# Patient Record
Sex: Female | Born: 1945 | Race: White | Hispanic: No | Smoking: Never smoker
Health system: Southern US, Community
[De-identification: ages and names within clinical notes are randomized; demographics above are authoritative.]

## PROBLEM LIST (undated history)

## (undated) DIAGNOSIS — I10 Essential (primary) hypertension: Secondary | ICD-10-CM

## (undated) DIAGNOSIS — G40909 Epilepsy, unspecified, not intractable, without status epilepticus: Secondary | ICD-10-CM

## (undated) DIAGNOSIS — E1340 Other specified diabetes mellitus with diabetic neuropathy, unspecified: Secondary | ICD-10-CM

## (undated) DIAGNOSIS — F330 Major depressive disorder, recurrent, mild: Secondary | ICD-10-CM

## (undated) DIAGNOSIS — E118 Type 2 diabetes mellitus with unspecified complications: Secondary | ICD-10-CM

## (undated) DIAGNOSIS — M25512 Pain in left shoulder: Secondary | ICD-10-CM

## (undated) DIAGNOSIS — E669 Obesity, unspecified: Secondary | ICD-10-CM

## (undated) DIAGNOSIS — E559 Vitamin D deficiency, unspecified: Secondary | ICD-10-CM

## (undated) DIAGNOSIS — E785 Hyperlipidemia, unspecified: Secondary | ICD-10-CM

## (undated) DIAGNOSIS — K5901 Slow transit constipation: Secondary | ICD-10-CM

## (undated) DIAGNOSIS — M25511 Pain in right shoulder: Secondary | ICD-10-CM

## (undated) DIAGNOSIS — F0390 Unspecified dementia without behavioral disturbance: Secondary | ICD-10-CM

## (undated) DIAGNOSIS — I639 Cerebral infarction, unspecified: Secondary | ICD-10-CM

## (undated) DIAGNOSIS — S7292XA Unspecified fracture of left femur, initial encounter for closed fracture: Secondary | ICD-10-CM

## (undated) HISTORY — DX: Type 2 diabetes mellitus with unspecified complications: E11.8

## (undated) HISTORY — DX: Unspecified dementia, unspecified severity, without behavioral disturbance, psychotic disturbance, mood disturbance, and anxiety: F03.90

## (undated) HISTORY — DX: Major depressive disorder, recurrent, mild: F33.0

## (undated) HISTORY — DX: Pain in right shoulder: M25.511

## (undated) HISTORY — DX: Pain in right shoulder: M25.512

## (undated) HISTORY — DX: Hyperlipidemia, unspecified: E78.5

## (undated) HISTORY — DX: Cerebral infarction, unspecified: I63.9

## (undated) HISTORY — DX: Epilepsy, unspecified, not intractable, without status epilepticus: G40.909

## (undated) HISTORY — DX: Essential (primary) hypertension: I10

## (undated) HISTORY — PX: OTHER SURGICAL HISTORY: SHX169

## (undated) HISTORY — DX: Slow transit constipation: K59.01

## (undated) HISTORY — DX: Obesity, unspecified: E66.9

## (undated) HISTORY — DX: Unspecified fracture of left femur, initial encounter for closed fracture: S72.92XA

## (undated) HISTORY — DX: Other specified diabetes mellitus with diabetic neuropathy, unspecified: E13.40

## (undated) HISTORY — DX: Vitamin D deficiency, unspecified: E55.9

---

## 1997-10-13 HISTORY — PX: OTHER SURGICAL HISTORY: SHX169

## 2000-10-13 DIAGNOSIS — I639 Cerebral infarction, unspecified: Secondary | ICD-10-CM

## 2000-10-13 HISTORY — DX: Cerebral infarction, unspecified: I63.9

## 2001-11-13 ENCOUNTER — Encounter: Payer: Self-pay | Admitting: Family Medicine

## 2002-10-13 ENCOUNTER — Encounter: Payer: Self-pay | Admitting: Family Medicine

## 2002-10-13 LAB — CONVERTED CEMR LAB: Hgb A1c MFr Bld: 7.5 %

## 2003-12-12 ENCOUNTER — Encounter: Payer: Self-pay | Admitting: Family Medicine

## 2003-12-12 LAB — CONVERTED CEMR LAB: Hgb A1c MFr Bld: 7.4 %

## 2004-04-12 ENCOUNTER — Encounter: Payer: Self-pay | Admitting: Family Medicine

## 2004-04-12 LAB — CONVERTED CEMR LAB: Hgb A1c MFr Bld: 7.9 %

## 2004-08-13 ENCOUNTER — Encounter: Payer: Self-pay | Admitting: Family Medicine

## 2004-08-13 LAB — CONVERTED CEMR LAB: Hgb A1c MFr Bld: 7.2 %

## 2004-11-13 ENCOUNTER — Encounter: Payer: Self-pay | Admitting: Family Medicine

## 2004-11-13 LAB — CONVERTED CEMR LAB: Hgb A1c MFr Bld: 7.6 %

## 2005-09-12 ENCOUNTER — Encounter: Payer: Self-pay | Admitting: Family Medicine

## 2006-03-16 ENCOUNTER — Ambulatory Visit: Payer: Self-pay | Admitting: Family Medicine

## 2006-04-12 ENCOUNTER — Encounter: Payer: Self-pay | Admitting: Family Medicine

## 2006-04-13 ENCOUNTER — Ambulatory Visit: Payer: Self-pay | Admitting: Family Medicine

## 2006-04-16 ENCOUNTER — Ambulatory Visit: Payer: Self-pay | Admitting: Family Medicine

## 2006-04-29 ENCOUNTER — Ambulatory Visit: Payer: Self-pay | Admitting: Family Medicine

## 2006-06-03 ENCOUNTER — Ambulatory Visit: Payer: Self-pay | Admitting: Family Medicine

## 2006-06-13 ENCOUNTER — Encounter: Payer: Self-pay | Admitting: Family Medicine

## 2006-06-24 ENCOUNTER — Ambulatory Visit: Payer: Self-pay | Admitting: Family Medicine

## 2006-06-26 ENCOUNTER — Encounter: Payer: Self-pay | Admitting: Family Medicine

## 2006-06-26 ENCOUNTER — Other Ambulatory Visit: Admission: RE | Admit: 2006-06-26 | Discharge: 2006-06-26 | Payer: Self-pay | Admitting: Family Medicine

## 2006-06-26 ENCOUNTER — Ambulatory Visit: Payer: Self-pay | Admitting: Family Medicine

## 2006-06-26 LAB — CONVERTED CEMR LAB: Pap Smear: NORMAL

## 2006-07-01 ENCOUNTER — Encounter: Admission: RE | Admit: 2006-07-01 | Discharge: 2006-07-01 | Payer: Self-pay | Admitting: Family Medicine

## 2006-07-13 ENCOUNTER — Encounter: Admission: RE | Admit: 2006-07-13 | Discharge: 2006-07-13 | Payer: Self-pay | Admitting: Family Medicine

## 2006-07-13 LAB — FECAL OCCULT BLOOD, GUAIAC: Fecal Occult Blood: NEGATIVE

## 2006-07-15 ENCOUNTER — Ambulatory Visit: Payer: Self-pay | Admitting: Family Medicine

## 2006-08-17 ENCOUNTER — Ambulatory Visit: Payer: Self-pay | Admitting: General Surgery

## 2006-10-02 ENCOUNTER — Ambulatory Visit: Payer: Self-pay | Admitting: Family Medicine

## 2006-10-14 ENCOUNTER — Ambulatory Visit: Payer: Self-pay | Admitting: Family Medicine

## 2006-10-14 LAB — CONVERTED CEMR LAB: Hgb A1c MFr Bld: 6.9 %

## 2006-10-16 ENCOUNTER — Ambulatory Visit: Payer: Self-pay | Admitting: Family Medicine

## 2006-11-20 ENCOUNTER — Ambulatory Visit: Payer: Self-pay | Admitting: Family Medicine

## 2007-01-29 ENCOUNTER — Ambulatory Visit: Payer: Self-pay | Admitting: Family Medicine

## 2007-03-31 ENCOUNTER — Encounter: Payer: Self-pay | Admitting: Family Medicine

## 2007-03-31 DIAGNOSIS — E119 Type 2 diabetes mellitus without complications: Secondary | ICD-10-CM

## 2007-03-31 DIAGNOSIS — F329 Major depressive disorder, single episode, unspecified: Secondary | ICD-10-CM

## 2007-03-31 DIAGNOSIS — I10 Essential (primary) hypertension: Secondary | ICD-10-CM | POA: Insufficient documentation

## 2007-04-02 ENCOUNTER — Ambulatory Visit: Payer: Self-pay | Admitting: Family Medicine

## 2007-04-02 DIAGNOSIS — B351 Tinea unguium: Secondary | ICD-10-CM

## 2007-04-30 ENCOUNTER — Ambulatory Visit: Payer: Self-pay | Admitting: Family Medicine

## 2007-05-06 ENCOUNTER — Encounter (INDEPENDENT_AMBULATORY_CARE_PROVIDER_SITE_OTHER): Payer: Self-pay | Admitting: *Deleted

## 2007-05-14 ENCOUNTER — Ambulatory Visit: Payer: Self-pay | Admitting: Family Medicine

## 2007-07-23 ENCOUNTER — Ambulatory Visit: Payer: Self-pay | Admitting: Family Medicine

## 2007-08-20 ENCOUNTER — Encounter: Admission: RE | Admit: 2007-08-20 | Discharge: 2007-08-20 | Payer: Self-pay | Admitting: Family Medicine

## 2007-08-23 ENCOUNTER — Encounter (INDEPENDENT_AMBULATORY_CARE_PROVIDER_SITE_OTHER): Payer: Self-pay | Admitting: *Deleted

## 2007-08-27 ENCOUNTER — Encounter: Payer: Self-pay | Admitting: Family Medicine

## 2007-09-24 ENCOUNTER — Ambulatory Visit: Payer: Self-pay | Admitting: Family Medicine

## 2007-09-25 LAB — CONVERTED CEMR LAB
ALT: 33 units/L (ref 0–35)
BUN: 14 mg/dL (ref 6–23)
Calcium: 9.7 mg/dL (ref 8.4–10.5)
Chloride: 101 meq/L (ref 96–112)
Creatinine, Ser: 0.9 mg/dL (ref 0.4–1.2)
Creatinine,U: 156.7 mg/dL
Hgb A1c MFr Bld: 7.3 % — ABNORMAL HIGH (ref 4.6–6.0)
Microalb, Ur: 3.1 mg/dL — ABNORMAL HIGH (ref 0.0–1.9)
VLDL: 45 mg/dL — ABNORMAL HIGH (ref 0–40)

## 2007-10-05 ENCOUNTER — Encounter: Payer: Self-pay | Admitting: Family Medicine

## 2007-10-05 ENCOUNTER — Ambulatory Visit: Payer: Self-pay | Admitting: Family Medicine

## 2007-10-05 ENCOUNTER — Other Ambulatory Visit: Admission: RE | Admit: 2007-10-05 | Discharge: 2007-10-05 | Payer: Self-pay | Admitting: Family Medicine

## 2007-10-12 ENCOUNTER — Encounter (INDEPENDENT_AMBULATORY_CARE_PROVIDER_SITE_OTHER): Payer: Self-pay | Admitting: *Deleted

## 2007-11-09 ENCOUNTER — Ambulatory Visit: Payer: Self-pay | Admitting: Family Medicine

## 2007-11-16 ENCOUNTER — Ambulatory Visit: Payer: Self-pay | Admitting: Family Medicine

## 2007-11-18 ENCOUNTER — Encounter (INDEPENDENT_AMBULATORY_CARE_PROVIDER_SITE_OTHER): Payer: Self-pay | Admitting: *Deleted

## 2008-07-18 ENCOUNTER — Ambulatory Visit: Payer: Self-pay | Admitting: Family Medicine

## 2008-09-29 ENCOUNTER — Encounter: Admission: RE | Admit: 2008-09-29 | Discharge: 2008-09-29 | Payer: Self-pay | Admitting: Family Medicine

## 2008-09-29 LAB — HM MAMMOGRAPHY: HM Mammogram: NORMAL

## 2008-10-03 ENCOUNTER — Encounter (INDEPENDENT_AMBULATORY_CARE_PROVIDER_SITE_OTHER): Payer: Self-pay | Admitting: *Deleted

## 2008-10-09 ENCOUNTER — Encounter: Payer: Self-pay | Admitting: Family Medicine

## 2008-12-07 ENCOUNTER — Ambulatory Visit: Payer: Self-pay | Admitting: Family Medicine

## 2009-04-06 ENCOUNTER — Ambulatory Visit: Payer: Self-pay | Admitting: Family Medicine

## 2009-04-09 LAB — CONVERTED CEMR LAB
ALT: 16 units/L (ref 0–35)
AST: 20 units/L (ref 0–37)
Alkaline Phosphatase: 82 units/L (ref 39–117)
BUN: 15 mg/dL (ref 6–23)
Basophils Absolute: 0.1 10*3/uL (ref 0.0–0.1)
Bilirubin, Direct: 0.1 mg/dL (ref 0.0–0.3)
Calcium: 9.3 mg/dL (ref 8.4–10.5)
Creatinine,U: 148.3 mg/dL
Direct LDL: 62.3 mg/dL
Eosinophils Relative: 4.5 % (ref 0.0–5.0)
GFR calc non Af Amer: 76.81 mL/min (ref >60)
HCT: 35.4 % — ABNORMAL LOW (ref 36.0–46.0)
HDL: 33.6 mg/dL — ABNORMAL LOW (ref >39.00)
Hemoglobin: 11.8 g/dL — ABNORMAL LOW (ref 12.0–15.0)
Lymphocytes Relative: 32.3 % (ref 12.0–46.0)
Lymphs Abs: 2.5 10*3/uL (ref 0.7–4.0)
Microalb Creat Ratio: 10.1 mg/g (ref 0.0–30.0)
Monocytes Relative: 7.7 % (ref 3.0–12.0)
Platelets: 201 10*3/uL (ref 150.0–400.0)
Potassium: 3.6 meq/L (ref 3.5–5.1)
Sodium: 144 meq/L (ref 135–145)
TSH: 4.55 microintl units/mL (ref 0.35–5.50)
Total Bilirubin: 0.6 mg/dL (ref 0.3–1.2)
Triglycerides: 249 mg/dL — ABNORMAL HIGH (ref 0.0–149.0)
VLDL: 49.8 mg/dL — ABNORMAL HIGH (ref 0.0–40.0)
WBC: 7.7 10*3/uL (ref 4.5–10.5)

## 2009-04-11 ENCOUNTER — Ambulatory Visit: Payer: Self-pay | Admitting: Family Medicine

## 2009-05-29 ENCOUNTER — Telehealth: Payer: Self-pay | Admitting: Family Medicine

## 2009-07-26 ENCOUNTER — Ambulatory Visit: Payer: Self-pay | Admitting: Family Medicine

## 2009-10-18 ENCOUNTER — Ambulatory Visit: Payer: Self-pay | Admitting: Family Medicine

## 2009-10-19 LAB — CONVERTED CEMR LAB: Vit D, 25-Hydroxy: 17 ng/mL — ABNORMAL LOW (ref 30–89)

## 2009-10-20 LAB — CONVERTED CEMR LAB
AST: 17 units/L (ref 0–37)
Albumin: 3.7 g/dL (ref 3.5–5.2)
Alkaline Phosphatase: 77 units/L (ref 39–117)
Basophils Absolute: 0.1 10*3/uL (ref 0.0–0.1)
Basophils Relative: 1.7 % (ref 0.0–3.0)
Bilirubin, Direct: 0 mg/dL (ref 0.0–0.3)
Calcium: 9.5 mg/dL (ref 8.4–10.5)
Direct LDL: 57.1 mg/dL
Eosinophils Absolute: 0.4 10*3/uL (ref 0.0–0.7)
GFR calc non Af Amer: 59.27 mL/min (ref >60)
HDL: 35.1 mg/dL — ABNORMAL LOW (ref >39.00)
Hemoglobin: 11.1 g/dL — ABNORMAL LOW (ref 12.0–15.0)
Hgb A1c MFr Bld: 7.1 % — ABNORMAL HIGH (ref 4.6–6.5)
Lymphocytes Relative: 32.7 % (ref 12.0–46.0)
MCHC: 31.8 g/dL (ref 30.0–36.0)
Monocytes Relative: 7.3 % (ref 3.0–12.0)
Neutro Abs: 4 10*3/uL (ref 1.4–7.7)
Neutrophils Relative %: 52.6 % (ref 43.0–77.0)
Potassium: 4.1 meq/L (ref 3.5–5.1)
RBC: 4 M/uL (ref 3.87–5.11)
Sodium: 144 meq/L (ref 135–145)
Total Bilirubin: 0.5 mg/dL (ref 0.3–1.2)
Total CHOL/HDL Ratio: 4
VLDL: 53.2 mg/dL — ABNORMAL HIGH (ref 0.0–40.0)

## 2009-10-31 ENCOUNTER — Ambulatory Visit: Payer: Self-pay | Admitting: Family Medicine

## 2009-10-31 DIAGNOSIS — R609 Edema, unspecified: Secondary | ICD-10-CM | POA: Insufficient documentation

## 2009-10-31 DIAGNOSIS — E559 Vitamin D deficiency, unspecified: Secondary | ICD-10-CM | POA: Insufficient documentation

## 2009-11-01 ENCOUNTER — Telehealth: Payer: Self-pay | Admitting: Family Medicine

## 2009-12-03 ENCOUNTER — Ambulatory Visit: Payer: Self-pay | Admitting: Family Medicine

## 2009-12-03 LAB — CONVERTED CEMR LAB: ALT: 12 units/L (ref 0–35)

## 2009-12-13 ENCOUNTER — Telehealth: Payer: Self-pay | Admitting: Family Medicine

## 2010-01-04 ENCOUNTER — Ambulatory Visit: Payer: Self-pay | Admitting: Family Medicine

## 2010-01-04 LAB — CONVERTED CEMR LAB
AST: 21 units/L (ref 0–37)
CO2: 31 meq/L (ref 19–32)
Chloride: 107 meq/L (ref 96–112)
Cholesterol: 121 mg/dL (ref 0–200)
Glucose, Bld: 136 mg/dL — ABNORMAL HIGH (ref 70–99)
Hgb A1c MFr Bld: 7 % — ABNORMAL HIGH (ref 4.6–6.5)
Sodium: 145 meq/L (ref 135–145)
Total CHOL/HDL Ratio: 3

## 2010-01-06 LAB — CONVERTED CEMR LAB: Vit D, 25-Hydroxy: 35 ng/mL (ref 30–89)

## 2010-01-09 ENCOUNTER — Ambulatory Visit: Payer: Self-pay | Admitting: Family Medicine

## 2010-04-03 ENCOUNTER — Ambulatory Visit: Payer: Self-pay | Admitting: Family Medicine

## 2010-04-04 LAB — CONVERTED CEMR LAB
Basophils Absolute: 0 10*3/uL (ref 0.0–0.1)
Bilirubin, Direct: 0.2 mg/dL (ref 0.0–0.3)
Calcium: 9.1 mg/dL (ref 8.4–10.5)
Creatinine, Ser: 0.8 mg/dL (ref 0.4–1.2)
GFR calc non Af Amer: 82.49 mL/min (ref >60)
HCT: 36.3 % (ref 36.0–46.0)
HDL: 33.9 mg/dL — ABNORMAL LOW (ref >39.00)
Hgb A1c MFr Bld: 6.6 % — ABNORMAL HIGH (ref 4.6–6.5)
LDL Cholesterol: 49 mg/dL (ref 0–99)
Lymphs Abs: 2.3 10*3/uL (ref 0.7–4.0)
Microalb Creat Ratio: 1.4 mg/g (ref 0.0–30.0)
Microalb, Ur: 1.4 mg/dL (ref 0.0–1.9)
Monocytes Relative: 6.8 % (ref 3.0–12.0)
Neutrophils Relative %: 61 % (ref 43.0–77.0)
Platelets: 219 10*3/uL (ref 150.0–400.0)
RDW: 17.4 % — ABNORMAL HIGH (ref 11.5–14.6)
Sodium: 147 meq/L — ABNORMAL HIGH (ref 135–145)
Total Bilirubin: 0.6 mg/dL (ref 0.3–1.2)
Total CHOL/HDL Ratio: 3
Total Protein: 6.8 g/dL (ref 6.0–8.3)
Triglycerides: 130 mg/dL (ref 0.0–149.0)
VLDL: 26 mg/dL (ref 0.0–40.0)
Vit D, 25-Hydroxy: 31 ng/mL (ref 30–89)

## 2010-04-08 ENCOUNTER — Ambulatory Visit: Payer: Self-pay | Admitting: Family Medicine

## 2010-07-05 ENCOUNTER — Ambulatory Visit: Payer: Self-pay | Admitting: Family Medicine

## 2010-07-09 ENCOUNTER — Ambulatory Visit: Payer: Self-pay | Admitting: Family Medicine

## 2010-07-09 DIAGNOSIS — M549 Dorsalgia, unspecified: Secondary | ICD-10-CM | POA: Insufficient documentation

## 2010-07-19 ENCOUNTER — Telehealth: Payer: Self-pay | Admitting: Family Medicine

## 2010-07-30 ENCOUNTER — Ambulatory Visit: Payer: Self-pay | Admitting: Family Medicine

## 2010-07-30 ENCOUNTER — Telehealth: Payer: Self-pay | Admitting: Family Medicine

## 2010-07-31 ENCOUNTER — Telehealth: Payer: Self-pay | Admitting: Family Medicine

## 2010-08-05 ENCOUNTER — Encounter: Admission: RE | Admit: 2010-08-05 | Discharge: 2010-08-05 | Payer: Self-pay | Admitting: Orthopaedic Surgery

## 2010-08-06 ENCOUNTER — Encounter: Payer: Self-pay | Admitting: Family Medicine

## 2010-08-06 ENCOUNTER — Telehealth: Payer: Self-pay | Admitting: Family Medicine

## 2010-08-08 ENCOUNTER — Telehealth: Payer: Self-pay | Admitting: Family Medicine

## 2010-10-11 ENCOUNTER — Ambulatory Visit: Payer: Self-pay | Admitting: Family Medicine

## 2010-10-11 LAB — CONVERTED CEMR LAB: Hgb A1c MFr Bld: 6.8 % — ABNORMAL HIGH (ref 4.6–6.5)

## 2010-10-18 ENCOUNTER — Ambulatory Visit
Admission: RE | Admit: 2010-10-18 | Discharge: 2010-10-18 | Payer: Self-pay | Source: Home / Self Care | Attending: Family Medicine | Admitting: Family Medicine

## 2010-10-18 LAB — HM DIABETES FOOT EXAM

## 2010-11-02 ENCOUNTER — Encounter: Payer: Self-pay | Admitting: Family Medicine

## 2010-11-12 NOTE — Assessment & Plan Note (Signed)
Summary: F/U AFTER LABS / LFW   Vital Signs:  Patient profile:   65 year old female Weight:      233 pounds Temp:     98.5 degrees F oral Pulse rate:   80 / minute Pulse rhythm:   regular BP sitting:   104 / 64  (left arm) Cuff size:   large  Vitals Entered By: Sydell Axon LPN (October 31, 2009 10:51 AM) CC: 6 Month follow-up after labs   History of Present Illness: Pt continues with trouble of her right shoulder/arm (she says her arm but points to her shoulder), probably after falling again...possibly hurting more from the cold  Her left arm is fine. 2-3 days ago, she put on new pair of socks and woke up that nite and woke up that night and had some discomfort of the left foot. She then noticed some inflammation which I think is from chronic edema.  Problems Prior to Update: 1)  Closed Fractures Upper End of Humerus, Bilat  (ICD-812.09) 2)  Special Screening Malig Neoplasms Other Sites  (ICD-V76.49) 3)  Obesity, Morbid  (ICD-278.01) 4)  Onychomycosis  (ICD-110.1) 5)  Hypercholesterolemia,111/trig 212/hdl 28/ldl 55 (2/03)  (ICD-272.0) 6)  Cva, Chronic Unsteadiness  (ICD-434.91) 7)  Hypertension  (ICD-401.9) 8)  Diabetes Mellitus, Type II  (ICD-250.00) 9)  Depression  (ICD-311) 10)  Allergy  (ICD-995.3)  Medications Prior to Update: 1)  Coreg 6.25 Mg Tabs (Carvedilol) .... Take 1 Tablet By Mouth Twice A Day 2)  Aggrenox 25-200 Mg Cp12 (Aspirin-Dipyridamole) .... Take One By Mouth Two Times A Day 3)  Nifedipine 60 Mg Tb24 (Nifedipine) .... Take 1 Tablet By Mouth Once A Day 4)  Glucophage 1000 Mg Tabs (Metformin Hcl) .... Take One By Mouth Daily 5)  Avandia 8 Mg Tabs (Rosiglitazone Maleate) .... Take 1 Tablet By Mouth Once A Day 6)  Zestril 40 Mg Tabs (Lisinopril) .... Take 1 Tablet By Mouth 7)  Effexor Xr 150 Mg Cp24 (Venlafaxine Hcl) .... Take 1 Capsule By Mouth Once A Day 8)  Zocor 20 Mg Tabs (Simvastatin) .... Take 1 Tablet By Mouth At Bedtime 9)  Hydrochlorothiazide  25 Mg  Tabs (Hydrochlorothiazide) .... Take One By Mouth Daily 10)  Amitriptyline Hcl 25 Mg  Tabs (Amitriptyline Hcl) .... Take One By Mouth Daily 11)  Lantus 100 Unit/ml  Soln (Insulin Glargine) .... 95 Units Per Day 12)  Precision Pcx Plus Test  Strp (Glucose Blood) .... Use Test Strips Twice Daily  Icd9 Code 250.00 13)  Precision Thin Lancets  Misc (Lancets) .... Use Twice A Day For Sugar Check 250.00 14)  Bd Insulin Syringe Ultrafine 31g X 5/16" 1 Ml Misc (Insulin Syringe-Needle U-100) .... Use Daily  250.00 15)  Nystatin 100000 Unit/gm Crea (Nystatin) .... Apply To Area Two Times A Day For One Week Minimum  Allergies: No Known Drug Allergies  Physical Exam  General:  Well-developed,well-nourished,in no acute distress; alert,appropriate and cooperative throughout examination, obesity worsened, stable  recently. Head:  Normocephalic and atraumatic without obvious abnormalities. No apparent alopecia or balding. Mild congestion. Eyes:  Conjunctiva clear bilaterally.  Ears:  External ear exam shows no significant lesions or deformities.  Otoscopic examination reveals clear canals, tympanic membranes are intact bilaterally without bulging, retraction, inflammation or discharge. Hearing is grossly normal bilaterally. Nose:  External nasal examination shows no deformity or inflammation. Nasal mucosa are pink and moist without lesions or exudates. Mouth:  Oral mucosa and oropharynx without lesions or exudates.  Teeth in  good repair. Neck:  No deformities, masses, or tenderness noted. Lungs:  Normal respiratory effort, chest expands symmetrically. Lungs are clear to auscultation, no crackles or wheezes. Heart:  Normal rate and regular rhythm. S1 and S2 normal without gallop, murmur, click, rub or other extra sounds. Extremities:  lower legs and feet 2+edema bilat with mild erythema, thickened nails and flaking skin.  Diabetes Management Exam:    Foot Exam (with socks and/or shoes not present):        Sensory-Pinprick/Light touch:          Left medial foot (L-4): diminished          Left dorsal foot (L-5): diminished          Left lateral foot (S-1): diminished          Right medial foot (L-4): diminished          Right dorsal foot (L-5): diminished          Right lateral foot (S-1): diminished       Sensory-Monofilament:          Left foot: diminished          Right foot: diminished       Inspection:          Left foot: normal          Right foot: normal       Nails:          Left foot: thickened          Right foot: thickened   Impression & Recommendations:  Problem # 1:  VITAMIN D DEFICIENCY (ICD-268.9) Assessment New Start Vit D OTC 1000Iu two times a day.  Problem # 2:  OBESITY, MORBID (ICD-278.01) Assessment: Deteriorated  Looks heavier but also retaining fluid.  Ht: 59 (04/11/2009)   Wt: 233 (10/31/2009)   BMI: 44.60 (04/11/2009)  Problem # 3:  HYPERCHOLESTEROLEMIA,111/TRIG 212/HDL 28/LDL 55 (2/03) (ICD-272.0) Assessment: Unchanged LDL great, Trigs high and HDL low. Discussed diet and exercise. She loves rice. The following medications were removed from the medication list:    Pravastatin Sodium 80 Mg Tabs (Pravastatin sodium) .Marland Kitchen... Take one by mouth daily This was entered in error as it is spouses med he needs refill for. Her updated medication list for this problem includes:    Zocor 20 Mg Tabs (Simvastatin) .Marland Kitchen... Take 1 tablet by mouth at bedtime  Labs Reviewed: SGOT: 17 (10/18/2009)   SGPT: 13 (10/18/2009)   HDL:35.10 (10/18/2009), 33.60 (04/06/2009)  LDL:DEL (09/24/2007)  Chol:126 (10/18/2009), 135 (04/06/2009)  Trig:266.0 (10/18/2009), 249.0 (04/06/2009)  Problem # 4:  HYPERTENSION (ICD-401.9) Assessment: Unchanged Adequate but start Lasix in place of HCTZ for swelling. The following medications were removed from the medication list:    Maxzide-25 37.5-25 Mg Tabs (Triamterene-hctz) .Marland Kitchen... Take one by mouth daily Her updated medication list for this  problem includes:    Coreg 6.25 Mg Tabs (Carvedilol) .Marland Kitchen... Take 1 tablet by mouth twice a day    Nifedipine 60 Mg Tb24 (Nifedipine) .Marland Kitchen... Take 1 tablet by mouth once a day    Zestril 40 Mg Tabs (Lisinopril) .Marland Kitchen... Take 1 tablet by mouth    Lasix 20 Mg Tabs (Furosemide) ..... One tab by mouth in am.  BP today: 104/64 Prior BP: 110/60 (04/11/2009)  Labs Reviewed: K+: 4.1 (10/18/2009) Creat: : 1.0 (10/18/2009)   Chol: 126 (10/18/2009)   HDL: 35.10 (10/18/2009)   LDL: DEL (09/24/2007)   TG: 266.0 (10/18/2009)  Problem # 5:  DIABETES MELLITUS, TYPE  II (ICD-250.00) Assessment: Deteriorated  Slightly  worse...avg went from 93 in Oct, to 101 in Nov to 113 in Dec and prob worse today with A1C back up. Incr back to 95 units Lantus. Her updated medication list for this problem includes:    Glucophage 1000 Mg Tabs (Metformin hcl) .Marland Kitchen... Take one by mouth daily    Avandia 8 Mg Tabs (Rosiglitazone maleate) .Marland Kitchen... Take 1 tablet by mouth once a day    Zestril 40 Mg Tabs (Lisinopril) .Marland Kitchen... Take 1 tablet by mouth    Lantus 100 Unit/ml Soln (Insulin glargine) .Marland Kitchen... 92 units per day  Labs Reviewed: Creat: 1.0 (10/18/2009)     Last Eye Exam: diabetic retinopathy (08/27/2007) Reviewed HgBA1c results: 7.1 (10/18/2009)  6.8 (04/06/2009)  Problem # 6:  DEPENDENT EDEMA, LEGS, BILATERAL (ICD-782.3) Assessment: New  Start Lasix instead of HCTZ.. The following medications were removed from the medication list:    Maxzide-25 37.5-25 Mg Tabs (Triamterene-hctz) .Marland Kitchen... Take one by mouth daily entered by mistake from husband's list. Her updated medication list for this problem includes:    Lasix 20 Mg Tabs (Furosemide) ..... One tab by mouth in am.  Discussed elevation of the legs, use of compression stockings, sodium restiction, and medication use.   Complete Medication List: 1)  Coreg 6.25 Mg Tabs (Carvedilol) .... Take 1 tablet by mouth twice a day 2)  Aggrenox 25-200 Mg Cp12 (Aspirin-dipyridamole) .... Take  one by mouth two times a day 3)  Nifedipine 60 Mg Tb24 (Nifedipine) .... Take 1 tablet by mouth once a day 4)  Glucophage 1000 Mg Tabs (Metformin hcl) .... Take one by mouth daily 5)  Avandia 8 Mg Tabs (Rosiglitazone maleate) .... Take 1 tablet by mouth once a day 6)  Zestril 40 Mg Tabs (Lisinopril) .... Take 1 tablet by mouth 7)  Effexor Xr 150 Mg Cp24 (Venlafaxine hcl) .... Take 1 capsule by mouth once a day 8)  Zocor 20 Mg Tabs (Simvastatin) .... Take 1 tablet by mouth at bedtime 9)  Lasix 20 Mg Tabs (Furosemide) .... One tab by mouth in am. 10)  Amitriptyline Hcl 25 Mg Tabs (Amitriptyline hcl) .... Take one by mouth daily 11)  Lantus 100 Unit/ml Soln (Insulin glargine) .... 92 units per day 12)  Precision Pcx Plus Test Strp (Glucose blood) .... Use test strips twice daily  icd9 code 250.00 13)  Precision Thin Lancets Misc (Lancets) .... Use twice a day for sugar check 250.00 14)  Bd Insulin Syringe Ultrafine 31g X 5/16" 1 Ml Misc (Insulin syringe-needle u-100) .... Use daily  250.00 15)  Nystatin 100000 Unit/gm Crea (Nystatin) .... Apply to area two times a day for one week minimum 16)  Vitamin D3 1000 Unit Tabs (Cholecalciferol) .... One tab by mouth two times a day  Patient Instructions: 1)  RTC one month BMET prior 782.3 Vit D lvel also 268.9 2)  Repeat diabetic foot exam next time. Prescriptions: VITAMIN D3 1000 UNIT TABS (CHOLECALCIFEROL) one tab by mouth two times a day  #180 x 4   Entered and Authorized by:   Shaune Leeks MD   Signed by:   Shaune Leeks MD on 10/31/2009   Method used:   Print then Give to Patient   RxID:   [id number] LASIX 20 MG TABS (FUROSEMIDE) one tab by mouth in AM.  #90 x 3   Entered and Authorized by:   Shaune Leeks MD   Signed by:   Shaune Leeks MD on 10/31/2009  Method used:   Print then Give to Patient   RxID:   [id number] LASIX 20 MG TABS (FUROSEMIDE) one tab by mouth in AM.  #90 x 3   Entered and  Authorized by:   Shaune Leeks MD   Signed by:   Shaune Leeks MD on 10/31/2009   Method used:   Electronically to        [facility]* (retail)       [address]       [place], [place]  [address]       Ph: [phone]       Fax: [phone]   RxID:   [id number]   Current Allergies (reviewed today): No known allergies

## 2010-11-12 NOTE — Assessment & Plan Note (Signed)
Summary: 3 mo f/u per Dr. Alvester Chou   Vital Signs:  Patient profile:   65 year old female Weight:      230.50 pounds BMI:     46.72 Temp:     98.2 degrees F oral Pulse rate:   76 / minute Pulse rhythm:   regular BP sitting:   124 / 80  (left arm) Cuff size:   large  Vitals Entered By: Sydell Axon LPN (January 09, 2010 9:58 AM) CC: 3 Month follow-up   History of Present Illness: Pt here with husband for three month followup. We changed her Avandia to Actos but haven't started yet as she had a big supply of Avandia. Will do so 1 May. Her Sugar control appears stable and she has no overt sxs from her Diabetes. Her right shoulder is hurting less since starting the Vit D and has a somewhat better sense of wellbeing. She is tolerating her Simva ok and has just gotten a treadmill which she will start today! That should help weight, sugar, BP and Trigs. Her swelling is better on Lasix but she has developed a flaky mildly erythem rash on the ant shinns, L>R. She takes an occas Vicodin for her shoulder, not one even every other day and we discussed this,. She gets them from her orthopedist. Overall, she feels well with no complaints today other than the rash.  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 Take 1 Tablet By Mouth Twice A Day 2)  Aggrenox 25-200 Mg Cp12 (Aspirin-Dipyridamole) .... Take One By Mouth Two  Times A Day 3)  Nifedipine 60 Mg Tb24 (Nifedipine) .... Take 1 Tablet By Mouth Once A Day 4)  Glucophage 1000 Mg Tabs (Metformin Hcl) .... Take One By Mouth Daily 5)  Actos 45 Mg Tabs (Pioglitazone Hcl) .... One Tab By Mouth Daily 6)  Zestril 40 Mg Tabs (Lisinopril) .... Take 1 Tablet By Mouth 7)  Effexor Xr 150 Mg Cp24 (Venlafaxine Hcl) .... Take 1 Capsule By Mouth Once A Day 8)  Zocor 20 Mg Tabs (Simvastatin) .... Take 1 Tablet By Mouth At Bedtime 9)  Lasix 20 Mg Tabs (Furosemide) .... One Tab By Mouth in Am. 10)  Amitriptyline Hcl 25 Mg  Tabs (Amitriptyline Hcl) .... Take One By Mouth Daily 11)  Lantus 100 Unit/ml  Soln (Insulin Glargine) .... 95 Units Per Day 12)  Precision Pcx Plus Test  Strp (Glucose Blood) .... Use Test Strips Twice Daily  Icd9 Code 250.00 13)  Precision Thin Lancets  Misc (Lancets) .... Use Twice A Day For Sugar Check 250.00 14)  Bd Insulin Syringe Ultrafine 31g X 5/16" 1 Ml Misc (Insulin Syringe-Needle U-100) .... Use Daily  250.00 15)  Nystatin 100000 Unit/gm Crea (Nystatin) .... Apply To Area Two Times A Day For One Week Minimum 16)  Vitamin D3 1000 Unit Tabs (Cholecalciferol) .... One Tab By Mouth Two Times A Day  Allergies: No Known Drug Allergies  Physical Exam  General:  Well-developed,well-nourished,in no acute distress; alert,appropriate and cooperative throughout examination, obesity worsened, stable  recently. Head:  Normocephalic and atraumatic without obvious abnormalities. No apparent alopecia or balding. Mild congestion. Eyes:  Conjunctiva clear bilaterally.  Ears:  External ear exam shows no significant lesions or deformities.  Otoscopic examination reveals clear canals, tympanic membranes are intact bilaterally without bulging, retraction, inflammation or discharge. Hearing is grossly normal bilaterally. Nose:  External nasal examination shows no deformity or inflammation. Nasal mucosa are pink and moist without lesions or exudates. Mouth:  Oral  mucosa and oropharynx without lesions or exudates.  Teeth in good repair. Neck:  No deformities, masses, or tenderness noted. Lungs:  Normal respiratory effort, chest expands symmetrically. Lungs are clear to auscultation, no crackles or wheezes. Heart:  Normal rate and regular rhythm. S1 and S2 normal without gallop, murmur, click, rub or other extra sounds. Extremities:  lower legs and feet min edema bilat with mild erythema, thickened nails and flaking skin. Concentrated erythem flaking rash of ant shinsL>R, classic claudicatory look.  Diabetes Management Exam:    Foot Exam (with socks and/or shoes not present):       Sensory-Pinprick/Light touch:          Left medial foot (L-4): normal          Left dorsal foot (L-5): normal          Left lateral foot (S-1): normal          Right medial foot (L-4): normal          Right dorsal foot (L-5): normal          Right lateral foot (S-1): normal       Sensory-Monofilament:          Left foot: normal          Right foot: normal       Inspection:          Left foot: normal          Right foot: normal       Nails:          Left foot: thickened          Right foot: thickened   Impression & Recommendations:  Problem # 1:  DEPENDENT EDEMA, LEGS, BILATERAL (ICD-782.3) Assessment Improved Discussed regular Eucerin and regular exercise to help. Her updated medication list for this problem includes:    Lasix 20 Mg Tabs (Furosemide) ..... One tab by mouth in am.  Problem # 2:  VITAMIN D DEFICIENCY (ICD-268.9) Assessment: Improved Cont curr dose.  Problem # 3:  OBESITY, MORBID (ICD-278.01) Assessment: Improved Has lost three pounds. Encouraged to cont watching intake and start using the treadmill.  Problem # 4:  HYPERCHOLESTEROLEMIA,111/TRIG 212/HDL 28/LDL 55 (2/03) (ICD-272.0) Assessment: Unchanged  Stable, Trigs down slightly but still a ways to go...exercise and carb restricction discussed. Her updated medication list for this problem  includes:    Zocor 20 Mg Tabs (Simvastatin) .Marland Kitchen... Take 1 tablet by mouth at bedtime  Labs Reviewed: SGOT: 21 (01/04/2010)   SGPT: 15 (01/04/2010)   HDL:37.50 (01/04/2010), 35.10 (10/18/2009)  LDL:DEL (09/24/2007)  Chol:121 (01/04/2010), 126 (10/18/2009)  Trig:233.0 (01/04/2010), 266.0 (10/18/2009)  Problem # 5:  DIABETES MELLITUS, TYPE II (ICD-250.00) Assessment: Unchanged  Her updated medication list for this problem includes:    Glucophage 1000 Mg Tabs (Metformin hcl) .Marland Kitchen... Take one by mouth daily    Actos 45 Mg Tabs (Pioglitazone hcl) ..... One tab by mouth  daily    Zestril 40 Mg Tabs (Lisinopril) .Marland Kitchen... Take 1 tablet by mouth    Lantus 100 Unit/ml Soln (Insulin glargine) .Marland Kitchen... 95 units per day  Labs Reviewed: Creat: 0.9 (01/04/2010)     Last Eye Exam: diabetic retinopathy (08/27/2007) Reviewed HgBA1c results: 7.0 (01/04/2010)  7.1 (10/18/2009)  Problem # 6:  HYPERTENSION (ICD-401.9) Assessment: Unchanged  Her updated medication list for this problem includes:    Coreg 6.25 Mg Tabs (Carvedilol) .Marland Kitchen... Take 1 tablet by mouth twice a day    Nifedipine 60 Mg Tb24 (Nifedipine) .Marland Kitchen... Take 1 tablet by mouth once a day    Zestril 40 Mg Tabs (Lisinopril) .Marland Kitchen... Take 1 tablet by mouth    Lasix 20 Mg Tabs (Furosemide) ..... One tab by mouth in am.  BP today: 124/80 Prior BP: 104/64 (10/31/2009)  Labs Reviewed: K+: 4.4 (01/04/2010) Creat: : 0.9 (01/04/2010)   Chol: 121 (01/04/2010)   HDL: 37.50 (01/04/2010)   LDL: DEL (09/24/2007)   TG: 233.0 (01/04/2010)  Complete Medication List: 1)  Coreg 6.25 Mg Tabs (Carvedilol) .... Take 1 tablet by mouth twice a day 2)  Aggrenox 25-200 Mg Cp12 (Aspirin-dipyridamole) .... Take one by mouth two times a day 3)  Nifedipine 60 Mg Tb24 (Nifedipine) .... Take 1 tablet by mouth once a day 4)  Glucophage 1000 Mg Tabs (Metformin hcl) .... Take one by mouth daily 5)  Actos 45 Mg Tabs (Pioglitazone hcl) .... One tab by mouth daily 6)  Zestril 40 Mg Tabs  (Lisinopril) .... Take 1 tablet by mouth 7)  Effexor Xr 150 Mg Cp24 (Venlafaxine hcl) .... Take 1 capsule by mouth once a day 8)  Zocor 20 Mg Tabs (Simvastatin) .... Take 1 tablet by mouth at bedtime 9)  Lasix 20 Mg Tabs (Furosemide) .... One tab by mouth in am. 10)  Amitriptyline Hcl 25 Mg Tabs (Amitriptyline hcl) .... Take one by mouth daily 11)  Lantus 100 Unit/ml Soln (Insulin glargine) .... 95 units per day 12)  Precision Pcx Plus Test Strp (Glucose blood) .... Use test strips twice daily  icd9 code 250.00 13)  Precision Thin Lancets Misc (Lancets) .... Use twice a day for sugar check 250.00 14)  Bd Insulin Syringe Ultrafine 31g X 5/16" 1 Ml Misc (Insulin syringe-needle u-100) .... Use daily  250.00 15)  Nystatin 100000 Unit/gm Crea (Nystatin) .... Apply to area two times a day for one week minimum 16)  Vitamin D3 1000 Unit Tabs (Cholecalciferol) .... One tab by mouth two times a day 17)  Hydrocodone-acetaminophen 5-325 Mg Tabs (Hydrocodone-acetaminophen) .... Take one by mouth every 12 hours as needed for severe pain  Patient Instructions: 1)  RTC 3 mos, labs prior  Current Allergies (reviewed today): No known allergies   Appended Document: 3 mo f/u per Dr. Alvester Chou Pt needs a firm hand in her medical carre. Needs guidnace with what to do and how to do it.

## 2010-11-12 NOTE — Progress Notes (Signed)
Summary: Lab appts and f/u appts scheduled  Phone Note Outgoing Call Call back at [facility] Phone [phone]   Call placed by: Linde Gillis CMA Duncan Dull),  November 01, 2009 9:14 AM Call placed to: Patient Summary of Call: Spoke with patient's husband, scheduled one month follow up labs SGOT/SGPT for 12/03/2009.  Three month fasting labs scheduled for 01/04/2010 SGOT/SGPT, cholesterol, HgbA1C.  Three month follow up appt with Dr. Hetty Ely scheduled for 01/09/2010. Initial call taken by: Linde Gillis CMA Duncan Dull),  November 01, 2009 9:17 AM

## 2010-11-12 NOTE — Progress Notes (Signed)
Summary: vicodin not helping  Phone Note Call from Patient Call back at Home Phone [phone]   Caller: Spouse Call For: Dr. Para March Summary of Call: Pt was seen yesterday,  has severe back pain and is taking vicodin, which is not helping. ( I could hear pt moaning in the back ground).  Husband says something stronger needs to be prescribed.  Please advise. Initial call taken by: Lowella Petties CMA,  July 31, 2010 11:40 AM  Follow-up for Phone Call        Please call in percocet 5/325 1 by mouth three times a day as needed for pain.  #30, rf, do not use with vicodin.  Please have patient referred to ortho.  I'll put in the order.  Follow-up by: Crawford Givens MD,  July 31, 2010 1:08 PM  Additional Follow-up for Phone Call Additional follow up Details #1::        Medication phoned to pharmacy. Patient Advised.  Additional Follow-up by: Delilah Shan CMA (AAMA),  July 31, 2010 1:51 PM    New/Updated Medications: PERCOCET 5-325 MG TABS (OXYCODONE-ACETAMINOPHEN) 1 by mouth three times a day as needed for pain. Prescriptions: PERCOCET 5-325 MG TABS (OXYCODONE-ACETAMINOPHEN) 1 by mouth three times a day as needed for pain.  #30 x 0   Entered by:   Delilah Shan CMA (AAMA)   Authorized by:   Crawford Givens MD   Signed by:   Delilah Shan CMA (AAMA) on 07/31/2010   Method used:   Telephoned to ...       [facility]* (retail)       [address]       [place], [place]  [address]       Ph: [phone]       Fax: [phone]   RxID:   [id number]

## 2010-11-12 NOTE — Progress Notes (Signed)
Summary: Rx Actos or Avandia  Phone Note Call from Patient Call back at Home Phone [phone]   Caller: Spouse Call For: Shaune Leeks MD Summary of Call: Patient's spouse called because he is confused about his wife's Rx's.  She was on Avandia for several years but the last Rx she was given was for Actos.  She is fine with one or the other medication but they need a written Rx for a 90 day supply with 4 refills so they can take it to [place] where they get there medications.  Please advise. Initial call taken by: Linde Gillis CMA Duncan Dull),  December 13, 2009 9:20 AM  Follow-up for Phone Call        Switched to Actos due to media concern about Avandia.  I have capitulated. In my out box. Follow-up by: Shaune Leeks MD,  December 13, 2009 9:32 AM  Additional Follow-up for Phone Call Additional follow up Details #1::        Patient's spouse notified, Rx left up front for pick up. Additional Follow-up by: Linde Gillis CMA Duncan Dull),  December 13, 2009 10:43 AM    Prescriptions: ACTOS 45 MG TABS (PIOGLITAZONE HCL) one tab by mouth daily  #90 x 4   Entered and Authorized by:   Shaune Leeks MD   Signed by:   Shaune Leeks MD on 12/13/2009   Method used:   Print then Give to Patient   RxID:   [id number]

## 2010-11-12 NOTE — Progress Notes (Signed)
Summary: percocet  Phone Note Refill Request Message from:  Patient on August 08, 2010 11:49 AM  Refills Requested: Medication #1:  PERCOCET 5-325 MG TABS 1 by mouth three times a day as needed for pain..  Method Requested: Pick up at Office Initial call taken by: Melody Comas,  August 08, 2010 11:49 AM  Follow-up for Phone Call        please find out status of ortho referral.  Follow-up by: Crawford Givens MD,  August 08, 2010 1:42 PM  Additional Follow-up for Phone Call Additional follow up Details #1::        Patient advised.  Rx. left at front desk.  Had appt. with Dr. Rayburn Ma on 08/05/10.   He did x-rays and is getting a CAT scan.   Delilah Shan CMA Duncan Dull)  August 08, 2010 4:29 PM  Additional Follow-up by: Crawford Givens MD,  August 08, 2010 4:50 PM    Additional Follow-up for Phone Call Additional follow up Details #2::    noted,  will await records.  Follow-up by: Crawford Givens MD,  August 08, 2010 4:50 PM  Prescriptions: PERCOCET 5-325 MG TABS (OXYCODONE-ACETAMINOPHEN) 1 by mouth three times a day as needed for pain.  #30 x 0   Entered and Authorized by:   Crawford Givens MD   Signed by:   Crawford Givens MD on 08/08/2010   Method used:   Print then Give to Patient   RxID:   [id number]

## 2010-11-12 NOTE — Assessment & Plan Note (Signed)
Summary: BACK PAIN/CLE   Vital Signs:  Patient profile:   65 year old female Height:      59 inches Weight:      234 pounds BMI:     47.43 Temp:     98.6 degrees F oral Pulse rate:   72 / minute Pulse rhythm:   regular BP sitting:   110 / 70  (left arm) Cuff size:   large  Vitals Entered By: Delilah Shan CMA Duncan Dull) (July 30, 2010 11:44 AM) CC: Back pain   History of Present Illness: Fell  ~2 weeks ago.  Longstanding problem with balance.  Back pain started/increased a few days later.   Spasms got some better since starting.  Now the biggest problem is the nausea a/w pain.  Dec in oral intake.  Sleep disrupted.  Pain a little above waist line, in midline.  Present all the time, some worse than others.  Had vicodin for shoulder pain.  Some help with vicodin 5/325 but drowsy if taking 10/650, as this AM.  Able to bear weight minimally due to pain.   Allergies: No Known Drug Allergies  Past History:  Past Medical History: Allergic rhinitis Depression Diabetes mellitus, type II with neuropathy Hypertension obesity chronic bilateral shoulder pain, uses vicodin a few times a month for pain control CVA  Review of Systems       See HPI.  Otherwise negative.  No radiation and no weakness in legs (dec in walking is due to pain)  Physical Exam  General:  In WC A&O Uncomfortable MMM RRR CTAB back w/o pain on palpation.  Able to stand but with pain.  no change with flex/ext.   Distally sensation and motor exam intact bilaterally   Impression & Recommendations:  Problem # 1:  BACK PAIN (ICD-724.5) Wheelchair is appropriate in home due to pain.  Will continue vicodin and basic stretches.  I doubt fx due to delay in onset of pain.  No indication for imaging as she has no change in neuro exam and due to timeline of pain.  follow up as needed.  She agrees.  Her updated medication list for this problem includes:    Hydrocodone-acetaminophen 5-325 Mg Tabs  (Hydrocodone-acetaminophen) .Marland Kitchen... Take one by mouth every 4-6 hours as needed for severe pain  Complete Medication List: 1)  Coreg 6.25 Mg Tabs (Carvedilol) .... Take 1 tablet by mouth twice a day 2)  Aggrenox 25-200 Mg Cp12 (Aspirin-dipyridamole) .... Take one by mouth two times a day 3)  Nifedipine 60 Mg Tb24 (Nifedipine) .... Take 1 tablet by mouth once a day 4)  Glucophage 1000 Mg Tabs (Metformin hcl) .... Take one by mouth daily 5)  Actos 45 Mg Tabs (Pioglitazone hcl) .... One tab by mouth daily 6)  Zestril 40 Mg Tabs (Lisinopril) .... Take 1 tablet by mouth 7)  Effexor Xr 150 Mg Cp24 (Venlafaxine hcl) .... Take 1 capsule by mouth once a day 8)  Zocor 20 Mg Tabs (Simvastatin) .... Take 1 tablet by mouth at bedtime 9)  Lasix 20 Mg Tabs (Furosemide) .... One tab by mouth in am. 10)  Amitriptyline Hcl 25 Mg Tabs (Amitriptyline hcl) .... Take one by mouth daily 11)  Lantus 100 Unit/ml Soln (Insulin glargine) .... 70  units per day 12)  Precision Pcx Plus Test Strp (Glucose blood) .... Use test strips twice daily  icd9 code 250.00 13)  Precision Thin Lancets Misc (Lancets) .... Use twice a day for sugar check 250.00 14)  Bd  Insulin Syringe Ultrafine 31g X 5/16" 1 Ml Misc (Insulin syringe-needle u-100) .... Use daily  250.00 15)  Nystatin 100000 Unit/gm Crea (Nystatin) .... Apply to area two times a day for one week minimum 16)  Vitamin D3 1000 Unit Tabs (Cholecalciferol) .... One tab by mouth two times a day 17)  Hydrocodone-acetaminophen 5-325 Mg Tabs (Hydrocodone-acetaminophen) .... Take one by mouth every 4-6 hours as needed for severe pain 18)  Co Q-10 30 Mg Caps (Coenzyme q10) .Marland Kitchen.. 100 mg. once daily 19)  Claritin 10 Mg Tabs (Loratadine) .... Once daily  Patient Instructions: 1)  Take the pain med as needed.  Don't take 2 pills at the same time.  Use miralax twice a day for constipation.  You can cut back to once daily once you are going well.  Use a heating pad for your back but don't  get burned.  Ask for the medical supply company to send the forms to me.  Take care.   Prescriptions: HYDROCODONE-ACETAMINOPHEN 5-325 MG TABS (HYDROCODONE-ACETAMINOPHEN) Take one by mouth every 4-6 hours as needed for severe pain  #50 x 0   Entered and Authorized by:   Crawford Givens MD   Signed by:   Crawford Givens MD on 07/30/2010   Method used:   Print then Give to Patient   RxID:   [id number]    Orders Added: 1)  Est. Patient Level III [14782]    Current Allergies (reviewed today): No known allergies

## 2010-11-12 NOTE — Progress Notes (Signed)
Summary: Form for Wheelchair  Phone Note Other Incoming   Caller: Human resources officer of Call: [facility] says they faxed over a form for this patient to get a wheelchair.  I could not find any form.  Dr. Para March may have it or perhaps Dr. Hetty Ely.....? Initial call taken by: Delilah Shan CMA Duncan Dull),  July 19, 2010 5:38 PM  Follow-up for Phone Call        I hand wrote an order at last OV.  That was the last I knew of this.  I don't have the form.  They need to send it over.  Follow-up by: Crawford Givens MD,  July 21, 2010 6:45 PM  Additional Follow-up for Phone Call Additional follow up Details #1::        Lynden Ang at [facility] notified as instructed by telephone. Lynden Ang stated that she will check into this and let us know what is needed. Sydell Axon LPN  July 22, 2010 1:03 PM  Form explaining needed documentation for wheelchair is on your desk.   Lowella Petties CMA  July 23, 2010 4:59 PM     Additional Follow-up for Phone Call Additional follow up Details #2::    please send documents requested on the form, ie OV notes.  Follow-up by: Crawford Givens MD,  July 24, 2010 11:25 AM  Additional Follow-up for Phone Call Additional follow up Details #3:: Details for Additional Follow-up Action Taken: Notes faxed to [facility]. Additional Follow-up by: Melody Comas,  July 25, 2010 2:49 PM

## 2010-11-12 NOTE — Assessment & Plan Note (Signed)
Summary: F/U AFTER LABS &  ESTABLISH W DR. Para March / LFW   Vital Signs:  Patient profile:   65 year old female Height:      59 inches Weight:      234 pounds BMI:     47.43 Temp:     98.5 degrees F oral Pulse rate:   84 / minute Pulse rhythm:   regular BP sitting:   142 / 84  (left arm) Cuff size:   large  Vitals Entered By: Delilah Shan CMA Duncan Dull) (July 09, 2010 11:44 AM) CC: 3 months follow up after labs.  2.  Rx. for Hydrocodone/APAP 5-325   History of Present Illness: Diabetes:  Using medications without difficulties:yes Hypoglycemic episodes:occ Hyperglycemic episodes:no Feet problems: tinglilng controlled with TCA, no ulceration Blood Sugars averaging:  ~70 in the AM  Back pain.  Lower L spine in mid line.  No positional changes.  No radiation.  Going on for months.  Worse with prolonged standing and walking.  Better as soon as she sits down.  No weakness.   Allergies: No Known Drug Allergies  Past History:  Past Medical History: Allergic rhinitis Depression Diabetes mellitus, type II with neuropathy Hypertension obesity chronic bilateral shoulder pain, uses vicodin a few times a month for pain control  Past Surgical History: Reviewed history from 03/31/2007 and no changes required. NSVD X4 67, 70, 71, 77 HOSP Back Pain, Hypokalemia  1993 Ventral Hernia Repair 1999 Stroke  Chronic Unsteadiness  2002 Chronic Diff Standing/Falling  BP&BS out of control Colonoscopy  wnl 2001  Family History: Reviewed history from 04/11/2009 and no changes required. Father: Died at age 66 of heart attack with stroke Mother: Died at age 4 with coronary disease, diabetes, HTN, and stroke Sister A diabetes Maternal uncles also died of sudden death  Social History: Reviewed history from 03/31/2007 and no changes required. Marital Status: Married, 1966 From [place] Children: 4 Occupation: Housewife no tob alcohol: no enjoys time with grandkids  Review of  Systems       See HPI.  Otherwise negative.    Physical Exam  General:  NAD MMM Neck supple decrease in range of motion at bilateral shoulders, at baseline RRR CTAB ABD obese, soft, not tender to palpation Back w/o pain on palpation.  no midline pain while sitting.  no brusing or paraspinal muscle tenderness. motor function intact for bilateral lower extremities  Diabetes Management Exam:    Foot Exam (with socks and/or shoes not present):       Sensory-Pinprick/Light touch:          Left medial foot (L-4): normal          Left dorsal foot (L-5): normal          Left lateral foot (S-1): normal          Right medial foot (L-4): normal          Right dorsal foot (L-5): normal          Right lateral foot (S-1): normal       Sensory-Monofilament:          Left foot: normal          Right foot: normal       Sensory-other: sensation intact and similar to hands per patient.        Inspection:          Left foot: normal          Right foot: normal  Nails:          Left foot: normal          Right foot: normal   Impression & Recommendations:  Problem # 1:  DIABETES MELLITUS, TYPE II (ICD-250.00) Titrate lantus and continue other meds.  return for A1c in 3 months.  Her updated medication list for this problem includes:    Glucophage 1000 Mg Tabs (Metformin hcl) .Marland Kitchen... Take one by mouth daily    Actos 45 Mg Tabs (Pioglitazone hcl) ..... One tab by mouth daily    Zestril 40 Mg Tabs (Lisinopril) .Marland Kitchen... Take 1 tablet by mouth    Lantus 100 Unit/ml Soln (Insulin glargine) .Marland Kitchen... 84 units per day  Problem # 2:  BACK PAIN (ICD-724.5) Hand written rx for wheelchair, since patient cannot walk long distance.  I d/w patient and husband about not getting a motorized scooter for now and to try this first.  I don't see indication for plain films at this point.  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  This was d/w patient today.  I would use WC for mobility outside of  home but stressed for patient to do as much as possible on her own.  If pain is increasing, then she'll call back and we can discuss ortho follow up.  I do not suspect fx or other cause that would need targeted intervention at this point (other than weight loss). Her updated medication list for this problem includes:    Hydrocodone-acetaminophen 5-325 Mg Tabs (Hydrocodone-acetaminophen) .Marland Kitchen... Take one by mouth every 12 hours as needed for severe pain  Complete Medication List: 1)  Coreg 6.25 Mg Tabs (Carvedilol) .... Take 1 tablet by mouth twice a day 2)  Aggrenox 25-200 Mg Cp12 (Aspirin-dipyridamole) .... Take one by mouth two times a day 3)  Nifedipine 60 Mg Tb24 (Nifedipine) .... Take 1 tablet by mouth once a day 4)  Glucophage 1000 Mg Tabs (Metformin hcl) .... Take one by mouth daily 5)  Actos 45 Mg Tabs (Pioglitazone hcl) .... One tab by mouth daily 6)  Zestril 40 Mg Tabs (Lisinopril) .... Take 1 tablet by mouth 7)  Effexor Xr 150 Mg Cp24 (Venlafaxine hcl) .... Take 1 capsule by mouth once a day 8)  Zocor 20 Mg Tabs (Simvastatin) .... Take 1 tablet by mouth at bedtime 9)  Lasix 20 Mg Tabs (Furosemide) .... One tab by mouth in am. 10)  Amitriptyline Hcl 25 Mg Tabs (Amitriptyline hcl) .... Take one by mouth daily 11)  Lantus 100 Unit/ml Soln (Insulin glargine) .... 84 units per day 12)  Precision Pcx Plus Test Strp (Glucose blood) .... Use test strips twice daily  icd9 code 250.00 13)  Precision Thin Lancets Misc (Lancets) .... Use twice a day for sugar check 250.00 14)  Bd Insulin Syringe Ultrafine 31g X 5/16" 1 Ml Misc (Insulin syringe-needle u-100) .... Use daily  250.00 15)  Nystatin 100000 Unit/gm Crea (Nystatin) .... Apply to area two times a day for one week minimum 16)  Vitamin D3 1000 Unit Tabs (Cholecalciferol) .... One tab by mouth two times a day 17)  Hydrocodone-acetaminophen 5-325 Mg Tabs (Hydrocodone-acetaminophen) .... Take one by mouth every 12 hours as needed for severe  pain 18)  Co Q-10 30 Mg Caps (Coenzyme q10) .Marland Kitchen.. 100 mg. once daily 19)  Claritin 10 Mg Tabs (Loratadine) .... Once daily  Other Orders: Flu Vaccine 89yrs + MEDICARE PATIENTS (Z6109) Administration Flu vaccine - MCR (U0454)  Patient Instructions: 1)  If you morning sugar is 89 or lower, then decrease your next lantus dose by 1 unit. 2)  If your morning sugar is 90-110, don't change your insulin dose.  3)  If you morning sugar is 111 or higher, then increase your next lantus dose by 1 unit. 4)  I would like to see you back in 3 months for a appointment.  Labs before visit (A1c 250.00). 5)  Take care.  Glad to see you today.   Current Allergies (reviewed today): No known allergies  Flu Vaccine Consent Questions     Do you have a history of severe allergic reactions to this vaccine? no    Any prior history of allergic reactions to egg and/or gelatin? no    Do you have a sensitivity to the preservative Thimersol? no    Do you have a past history of Guillan-Barre Syndrome? no    Do you currently have an acute febrile illness? no    Have you ever had a severe reaction to latex? no    Vaccine information given and explained to patient? yes    Are you currently pregnant? no    Lot Number:[id number]   Exp Date:04/12/2011   Site Given  Left Deltoid IMedflu Lugene Fuquay CMA (AAMA)  July 09, 2010 1:07 PM

## 2010-11-12 NOTE — Letter (Signed)
Summary: Generic Letter  [place] at [facility]  [address] [place], [place] [address]   Phone: [phone]  Fax: [phone]    08/06/2010  Yvette Gutierrez [address] [place], [place]  [address]  To whom it may concern,  The above patient needs a wheelchair to help with mobility within her home.  Mobility is limited by pain. If you have questions, please call the clinic and request recent records.    Sincerely,     Crawford Givens MD  Appended Document: Generic Letter Faxed to [place] at [facility]

## 2010-11-12 NOTE — Progress Notes (Signed)
Summary: regarding wheelchair  Phone Note Other Incoming   Caller: [facility] [employer] [phone]- [place] Summary of Call: Berton Mount from [facility] is asking that a letter be faxed to [phone] for the wheelchair. She says that the first one that they recieved stated that patient would be using the wheelchair outside the home. They need a new one faxed stating that patient will use the wheelchaird inside the home in order for medicare to cover it.  Initial call taken by: Melody Comas,  August 06, 2010 10:07 AM  Follow-up for Phone Call        printed and signed.  in my outbox.  Follow-up by: Crawford Givens MD,  August 06, 2010 1:16 PM  Additional Follow-up for Phone Call Additional follow up Details #1::        Faxed. Additional Follow-up by: Delilah Shan CMA Duncan Dull),  August 06, 2010 4:26 PM

## 2010-11-12 NOTE — Assessment & Plan Note (Signed)
Summary: 3 MONTHF OLLOW UP/[facility]   Vital Signs:  Patient profile:   65 year old female Weight:      230 pounds Temp:     98.7 degrees F oral Pulse rate:   80 / minute Pulse rhythm:   regular BP sitting:   108 / 68  (left arm) Cuff size:   large  Vitals Entered By: Sydell Axon LPN (April 08, 2010 2:46 PM) CC: 3 Month follow-up   History of Present Illness: Pt here with husband for 3 month foillowup. She is doing great with sugar, her nos are great via her diary...typically 60s to 120s, one 46 on rare 140s.   She continues to gain weight....poss due to the now Actos after having been on Avandia for a long time.  She was the most successful at weight loss and control swimming which she is unable to do since moving here due to lack of pool. She used to have one in her backyard.  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 Take 1 Tablet By Mouth Twice A Day 2)  Aggrenox 25-200 Mg Cp12 (Aspirin-Dipyridamole) .... Take One By Mouth Two Times A Day 3)  Nifedipine 60 Mg Tb24 (Nifedipine) .... Take 1 Tablet By Mouth Once A Day 4)  Glucophage 1000 Mg Tabs (Metformin Hcl) .... Take One By Mouth Daily 5)  Actos 45 Mg Tabs (Pioglitazone Hcl) .... One Tab By Mouth Daily 6)  Zestril 40 Mg Tabs (Lisinopril) .... Take 1 Tablet By Mouth 7)  Effexor Xr 150 Mg Cp24 (Venlafaxine Hcl) .... Take 1 Capsule By Mouth Once A Day 8)   Zocor 20 Mg Tabs (Simvastatin) .... Take 1 Tablet By Mouth At Bedtime 9)  Lasix 20 Mg Tabs (Furosemide) .... One Tab By Mouth in Am. 10)  Amitriptyline Hcl 25 Mg  Tabs (Amitriptyline Hcl) .... Take One By Mouth Daily 11)  Lantus 100 Unit/ml  Soln (Insulin Glargine) .... 95 Units Per Day 12)  Precision Pcx Plus Test  Strp (Glucose Blood) .... Use Test Strips Twice Daily  Icd9 Code 250.00 13)  Precision Thin Lancets  Misc (Lancets) .... Use Twice A Day For Sugar Check 250.00 14)  Bd Insulin Syringe Ultrafine 31g X 5/16" 1 Ml Misc (Insulin Syringe-Needle U-100) .... Use Daily  250.00 15)  Nystatin 100000 Unit/gm Crea (Nystatin) .... Apply To Area Two Times A Day For One Week Minimum 16)  Vitamin D3 1000 Unit Tabs (Cholecalciferol) .... One Tab By Mouth Two Times A Day 17)  Hydrocodone-Acetaminophen 5-325 Mg Tabs (Hydrocodone-Acetaminophen) .... Take One By Mouth Every 12 Hours As Needed For Severe Pain  Allergies: No Known Drug Allergies  Physical Exam  General:  Well-developed,well-nourished,in no acute distress; alert,appropriate and cooperative throughout examination, obesity worsened, stable  recently. Head:  Normocephalic and atraumatic without obvious abnormalities. No apparent alopecia or balding. Mild congestion. Eyes:  Conjunctiva clear bilaterally.  Ears:  External ear exam shows no significant lesions  or deformities.  Otoscopic examination reveals clear canals, tympanic membranes are intact bilaterally without bulging, retraction, inflammation or discharge. Hearing is grossly normal bilaterally. Nose:  External nasal examination shows no deformity or inflammation. Nasal mucosa are pink and moist without lesions or exudates. Mouth:  Oral mucosa and oropharynx without lesions or exudates.  Teeth in good repair. Neck:  No deformities, masses, or tenderness noted. Chest Wall:  No deformities, masses, or tenderness noted. Lungs:  Normal respiratory effort, chest expands symmetrically.  Lungs are clear to auscultation, no crackles or wheezes. Heart:  Normal rate and regular rhythm. S1 and S2 normal without gallop, murmur, click, rub or other extra sounds.   Impression & Recommendations:  Problem # 1:  DIABETES MELLITUS, TYPE II (ICD-250.00) Assessment Improved Excellent control, cont curr meds. May need to decrease Actos if nos go lower. Call for new script if that is necessary. Her updated medication list for this problem includes:    Glucophage 1000 Mg Tabs (Metformin hcl) .Marland Kitchen... Take one by mouth daily    Actos 45 Mg Tabs (Pioglitazone hcl) ..... One tab by mouth daily    Zestril 40 Mg Tabs (Lisinopril) .Marland Kitchen... Take 1 tablet by mouth    Lantus 100 Unit/ml Soln (Insulin glargine) .Marland Kitchen... 95 units per day  Labs Reviewed: Creat: 0.8 (04/03/2010)     Last Eye Exam: diabetic retinopathy (08/27/2007) Reviewed HgBA1c results: 6.6 (04/03/2010)  7.0 (01/04/2010)  Problem # 2:  DEPENDENT EDEMA, LEGS, BILATERAL (ICD-782.3) Assessment: Unchanged Seems stable but needs to elevate more and use legs for muscle contraction to move fluid. Cont curr dose of diuretic. Her updated medication list for this problem includes:    Lasix 20 Mg Tabs (Furosemide) ..... One tab by mouth in am.  Discussed elevation of the legs, use of compression stockings, sodium restiction, and medication use.   Problem # 3:  VITAMIN D DEFICIENCY (ICD-268.9) Assessment: Unchanged Stable, cont curr dose.  Problem # 4:  OBESITY, MORBID (ICD-278.01) Assessment: Unchanged  Encouraged activity as much as poss.  Ht: 59 (04/11/2009)   Wt: 230 (04/08/2010)   BMI: 46.72 (01/09/2010)  Problem # 5:  HYPERTENSION (ICD-401.9) Assessment: Improved Great control, cont. Her updated medication list for this problem includes:    Coreg 6.25 Mg Tabs (Carvedilol) .Marland Kitchen... Take 1 tablet by mouth twice a day    Nifedipine 60 Mg Tb24 (Nifedipine) .Marland Kitchen... Take 1 tablet by mouth once a day    Zestril 40 Mg Tabs (Lisinopril)  .Marland Kitchen... Take 1 tablet by mouth    Lasix 20 Mg Tabs (Furosemide) ..... One tab by mouth in am.  BP today: 108/68 Prior BP: 124/80 (01/09/2010)  Labs Reviewed: K+: 4.6 (04/03/2010) Creat: : 0.8 (04/03/2010)   Chol: 109 (04/03/2010)   HDL: 33.90 (04/03/2010)   LDL: 49 (04/03/2010)   TG: 130.0 (04/03/2010)  Complete Medication List: 1)  Coreg 6.25 Mg Tabs (Carvedilol) .... Take 1 tablet by mouth twice a day 2)  Aggrenox 25-200 Mg Cp12 (Aspirin-dipyridamole) .... Take one by mouth two times a day 3)  Nifedipine 60 Mg Tb24 (Nifedipine) .... Take 1 tablet by mouth once a day 4)  Glucophage 1000 Mg Tabs (Metformin hcl) .... Take one by mouth daily 5)  Actos 45 Mg Tabs (Pioglitazone hcl) .... One tab by mouth daily 6)  Zestril 40 Mg Tabs (Lisinopril) .... Take 1 tablet by mouth 7)  Effexor Xr 150 Mg Cp24 (Venlafaxine hcl) .... Take 1 capsule by mouth once a day 8)  Zocor 20 Mg Tabs (  Simvastatin) .... Take 1 tablet by mouth at bedtime 9)  Lasix 20 Mg Tabs (Furosemide) .... One tab by mouth in am. 10)  Amitriptyline Hcl 25 Mg Tabs (Amitriptyline hcl) .... Take one by mouth daily 11)  Lantus 100 Unit/ml Soln (Insulin glargine) .... 95 units per day 12)  Precision Pcx Plus Test Strp (Glucose blood) .... Use test strips twice daily  icd9 code 250.00 13)  Precision Thin Lancets Misc (Lancets) .... Use twice a day for sugar check 250.00 14)  Bd Insulin Syringe Ultrafine 31g X 5/16" 1 Ml Misc (Insulin syringe-needle u-100) .... Use daily  250.00 15)  Nystatin 100000 Unit/gm Crea (Nystatin) .... Apply to area two times a day for one week minimum 16)  Vitamin D3 1000 Unit Tabs (Cholecalciferol) .... One tab by mouth two times a day 17)  Hydrocodone-acetaminophen 5-325 Mg Tabs (Hydrocodone-acetaminophen) .... Take one by mouth every 12 hours as needed for severe pain  Patient Instructions: 1)  RTC w/ Dr Para March in 3 mos, lab prior A1C 250.00

## 2010-11-12 NOTE — Progress Notes (Signed)
----   Converted from flag ---- ---- 07/30/2010 3:47 PM, Lowella Petties CMA wrote: Call from [facility], form for wheel chair needs to state that pt is using the wheelchair in the home, not outside, helping with daily activities in the home.  Medicare will not pay for the wheelchair to be used outside.  They are faxing new form. ------------------------------ I will await the form.  GSD 16:09 07/30/10.

## 2010-11-14 NOTE — Assessment & Plan Note (Signed)
Summary: ROA FOR 3 MONTH FOLLOW-UP/JRR   Vital Signs:  Patient profile:   65 year old female Height:      59 inches Weight:      227.25 pounds BMI:     46.06 Temp:     98.2 degrees F oral Pulse rate:   74 / minute Pulse rhythm:   regular BP sitting:   102 / 60  (left arm) Cuff size:   large  Vitals Entered By: Linde Gillis CMA  Dull) (October 18, 2010 1:55 PM) CC: 3 month follow up   History of Present Illness: Diabetes:  Using medications without difficulties:yes Hypoglycemic episodes:occ but w/o symptoms.  Hyperglycemic episodes:no Feet problems:no Blood Sugars averaging:80-100 in AM titrating he lantus as we had talked about before.  She is taking  ~66 units a day.  I talked with time about running slightly higher (with less lantus) to prevent low glucose.  They understood.  Still on actos.    She had a recent cough that is improving.  Occ pain in lower L ribs but this is improved today and yesterday- no pain today.  pain was positional, pain happened with cough.  No pain now.   Allergies (verified): No Known Drug Allergies  Past History:  Past Medical History: Last updated: 07/30/2010 Allergic rhinitis Depression Diabetes mellitus, type II with neuropathy Hypertension obesity chronic bilateral shoulder pain, uses vicodin a few times a month for pain control CVA  Review of Systems       See HPI.  Otherwise negative.    Physical Exam  General:  no apparent distress, pleasant tm w/o acute change mucous membranes moist, op w/o acute changes neck supple chronic postural changes for back and for range of motion with arms regular rate and rhythm clear to auscultation bilaterally she pointed to the anterior inferior ribs at the L midclavicular line.  this was the area of the pain prev.  not tender to palpation and no skin changes.  abdominal exam- soft, obese see foot exam  Diabetes Management Exam:    Foot Exam (with socks and/or shoes not present):  Sensory-Pinprick/Light touch:          Left medial foot (L-4): normal          Left dorsal foot (L-5): normal          Left lateral foot (S-1): normal          Right medial foot (L-4): normal          Right dorsal foot (L-5): normal          Right lateral foot (S-1): normal       Sensory-Monofilament:          Left foot: normal          Right foot: normal       Inspection:          Left foot: normal          Right foot: normal       Nails:          Left foot: normal          Right foot: normal   Impression & Recommendations:  Problem # 1:  DIABETES MELLITUS, TYPE II (ICD-250.00) D/w them about continuing to adjust the lantus and follow up as needed.  Labs reviewed with patient.  They understood.  i would like to avoid hypoglycemia.  Cough is improved and she likely had a MSK source for her pain.  follow  up as needed.  Her updated medication list for this problem includes:    Glucophage 1000 Mg Tabs (Metformin hcl) .Marland Kitchen... Take one by mouth daily    Actos 45 Mg Tabs (Pioglitazone hcl) ..... One tab by mouth daily    Zestril 40 Mg Tabs (Lisinopril) .Marland Kitchen... Take 1 tablet by mouth    Lantus 100 Unit/ml Soln (Insulin glargine) .Marland Kitchen... 66  units per day  Complete Medication List: 1)  Coreg 6.25 Mg Tabs (Carvedilol) .... Take 1 tablet by mouth twice a day 2)  Aggrenox 25-200 Mg Cp12 (Aspirin-dipyridamole) .... Take one by mouth two times a day 3)  Nifedipine 60 Mg Tb24 (Nifedipine) .... Take 1 tablet by mouth once a day 4)  Glucophage 1000 Mg Tabs (Metformin hcl) .... Take one by mouth daily 5)  Actos 45 Mg Tabs (Pioglitazone hcl) .... One tab by mouth daily 6)  Zestril 40 Mg Tabs (Lisinopril) .... Take 1 tablet by mouth 7)  Effexor Xr 150 Mg Cp24 (Venlafaxine hcl) .... Take 1 capsule by mouth once a day 8)  Zocor 20 Mg Tabs (Simvastatin) .... Take 1 tablet by mouth at bedtime 9)  Lasix 20 Mg Tabs (Furosemide) .... One tab by mouth in am. 10)  Amitriptyline Hcl 25 Mg Tabs (Amitriptyline hcl)  .... Take one by mouth daily 11)  Lantus 100 Unit/ml Soln (Insulin glargine) .... 66  units per day 12)  Precision Pcx Plus Test Strp (Glucose blood) .... Use test strips twice daily  icd9 code 250.00 13)  Precision Thin Lancets Misc (Lancets) .... Use twice a day for sugar check 250.00 14)  Bd Insulin Syringe Ultrafine 31g X 5/16" 1 Ml Misc (Insulin syringe-needle u-100) .... Use daily  250.00 15)  Nystatin 100000 Unit/gm Crea (Nystatin) .... Apply to area two times a day for one week minimum 16)  Vitamin D3 1000 Unit Tabs (Cholecalciferol) .... One tab by mouth two times a day 17)  Co Q-10 30 Mg Caps (Coenzyme q10) .Marland Kitchen.. 100 mg. once daily 18)  Claritin 10 Mg Tabs (Loratadine) .... Once daily 19)  Percocet 5-325 Mg Tabs (Oxycodone-acetaminophen) .Marland Kitchen.. 1 by mouth three times a day as needed for pain.  Patient Instructions: 1)  I would keep adjusting your lantus and come back for a visit in 3 months.  A1c before visit.  250.00. 2)  Let me know if you have concerns in the meantime.   3)  Take care.  Prescriptions: NYSTATIN 100000 UNIT/GM CREA (NYSTATIN) apply to area two times a day for one week minimum  #60 gms x 4   Entered and Authorized by:   Crawford Givens MD   Signed by:   Crawford Givens MD on 10/18/2010   Method used:   Print then Give to Patient   RxID:   [id number] BD INSULIN SYRINGE ULTRAFINE 31G X 5/16" 1 ML MISC (INSULIN SYRINGE-NEEDLE U-100) use daily  250.00  #100 x 4   Entered and Authorized by:   Crawford Givens MD   Signed by:   Crawford Givens MD on 10/18/2010   Method used:   Print then Give to Patient   RxID:   [id number] PRECISION THIN LANCETS  MISC (LANCETS) use twice a day for sugar check 250.00  #200 x 4   Entered and Authorized by:   Crawford Givens MD   Signed by:   Crawford Givens MD on 10/18/2010   Method used:   Print then Give to Patient   RxID:   [id number] PRECISION PCX  PLUS TEST  STRP (GLUCOSE BLOOD) use test strips twice daily  icd9 code 250.00   #200 x 4   Entered and Authorized by:   Crawford Givens MD   Signed by:   Crawford Givens MD on 10/18/2010   Method used:   Print then Give to Patient   RxID:   [id number] LANTUS 100 UNIT/ML  SOLN (INSULIN GLARGINE) 66  units per day  #9 vials x 0   Entered and Authorized by:   Crawford Givens MD   Signed by:   Crawford Givens MD on 10/18/2010   Method used:   Print then Give to Patient   RxID:   [id number] AMITRIPTYLINE HCL 25 MG  TABS (AMITRIPTYLINE HCL) Take one by mouth daily  #90 x 4   Entered and Authorized by:   Crawford Givens MD   Signed by:   Crawford Givens MD on 10/18/2010   Method used:   Print then Give to Patient   RxID:   [id number] LASIX 20 MG TABS (FUROSEMIDE) one tab by mouth in AM.  #90 x 4   Entered and Authorized by:   Crawford Givens MD   Signed by:   Crawford Givens MD on 10/18/2010   Method used:   Print then Give to Patient   RxID:   [id number] ZOCOR 20 MG TABS (SIMVASTATIN) Take 1 tablet by mouth at bedtime  #90 x 4   Entered and Authorized by:   Crawford Givens MD   Signed by:   Crawford Givens MD on 10/18/2010   Method used:   Print then Give to Patient   RxID:   [id number] EFFEXOR XR 150 MG CP24 (VENLAFAXINE HCL) Take 1 capsule by mouth once a day  #90 x 4   Entered and Authorized by:   Crawford Givens MD   Signed by:   Crawford Givens MD on 10/18/2010   Method used:   Print then Give to Patient   RxID:   [id number] ZESTRIL 40 MG TABS (LISINOPRIL) Take 1 tablet by mouth  #90 x 4   Entered and Authorized by:   Crawford Givens MD   Signed by:   Crawford Givens MD on 10/18/2010   Method used:   Print then Give to Patient   RxID:   [id number] ACTOS 45 MG TABS (PIOGLITAZONE HCL) one tab by mouth daily  #90 x 4   Entered and Authorized by:   Crawford Givens MD   Signed by:   Crawford Givens MD on 10/18/2010   Method used:   Print then Give to Patient   RxID:   [id number] GLUCOPHAGE 1000 MG TABS (METFORMIN HCL) Take one by mouth daily   #90 x 4   Entered and Authorized by:   Crawford Givens MD   Signed by:   Crawford Givens MD on 10/18/2010   Method used:   Print then Give to Patient   RxID:   [id number] NIFEDIPINE 60 MG TB24 (NIFEDIPINE) Take 1 tablet by mouth once a day  #90 x 4   Entered and Authorized by:   Crawford Givens MD   Signed by:   Crawford Givens MD on 10/18/2010   Method used:   Print then Give to Patient   RxID:   [id number] AGGRENOX 25-200 MG CP12 (ASPIRIN-DIPYRIDAMOLE) Take one by mouth two times a day  #180 x 4   Entered and Authorized by:   Crawford Givens MD   Signed by:   Crawford Givens MD on 10/18/2010   Method used:  Print then Give to Patient   RxID:   [id number] COREG 6.25 MG TABS (CARVEDILOL) Take 1 tablet by mouth twice a day  #180 x 4   Entered and Authorized by:   Crawford Givens MD   Signed by:   Crawford Givens MD on 10/18/2010   Method used:   Print then Give to Patient   RxID:   [id number]    Orders Added: 1)  Est. Patient Level III [84696]    Current Allergies (reviewed today): No known allergies

## 2010-12-03 ENCOUNTER — Encounter: Payer: Self-pay | Admitting: Family Medicine

## 2010-12-03 DIAGNOSIS — F329 Major depressive disorder, single episode, unspecified: Secondary | ICD-10-CM

## 2010-12-03 DIAGNOSIS — I639 Cerebral infarction, unspecified: Secondary | ICD-10-CM | POA: Insufficient documentation

## 2010-12-03 DIAGNOSIS — M25511 Pain in right shoulder: Secondary | ICD-10-CM | POA: Insufficient documentation

## 2010-12-03 DIAGNOSIS — I10 Essential (primary) hypertension: Secondary | ICD-10-CM

## 2010-12-03 DIAGNOSIS — J309 Allergic rhinitis, unspecified: Secondary | ICD-10-CM | POA: Insufficient documentation

## 2010-12-03 DIAGNOSIS — E1340 Other specified diabetes mellitus with diabetic neuropathy, unspecified: Secondary | ICD-10-CM | POA: Insufficient documentation

## 2010-12-03 DIAGNOSIS — E669 Obesity, unspecified: Secondary | ICD-10-CM

## 2010-12-03 DIAGNOSIS — M25512 Pain in left shoulder: Secondary | ICD-10-CM | POA: Insufficient documentation

## 2010-12-03 DIAGNOSIS — E119 Type 2 diabetes mellitus without complications: Secondary | ICD-10-CM

## 2011-01-14 ENCOUNTER — Other Ambulatory Visit: Payer: Self-pay | Admitting: Family Medicine

## 2011-01-15 ENCOUNTER — Other Ambulatory Visit (INDEPENDENT_AMBULATORY_CARE_PROVIDER_SITE_OTHER): Payer: Medicare Other | Admitting: Family Medicine

## 2011-01-15 DIAGNOSIS — E119 Type 2 diabetes mellitus without complications: Secondary | ICD-10-CM

## 2011-01-15 LAB — HEMOGLOBIN A1C: Hgb A1c MFr Bld: 6.7 % — ABNORMAL HIGH (ref 4.6–6.5)

## 2011-01-20 ENCOUNTER — Encounter: Payer: Self-pay | Admitting: Family Medicine

## 2011-01-20 ENCOUNTER — Ambulatory Visit (INDEPENDENT_AMBULATORY_CARE_PROVIDER_SITE_OTHER): Payer: Medicare Other | Admitting: Family Medicine

## 2011-01-20 VITALS — BP 110/80 | HR 80 | Temp 98.4°F | Ht 59.0 in | Wt 230.1 lb

## 2011-01-20 DIAGNOSIS — I6350 Cerebral infarction due to unspecified occlusion or stenosis of unspecified cerebral artery: Secondary | ICD-10-CM

## 2011-01-20 DIAGNOSIS — I639 Cerebral infarction, unspecified: Secondary | ICD-10-CM

## 2011-01-20 DIAGNOSIS — E119 Type 2 diabetes mellitus without complications: Secondary | ICD-10-CM

## 2011-01-20 DIAGNOSIS — M549 Dorsalgia, unspecified: Secondary | ICD-10-CM

## 2011-01-20 DIAGNOSIS — E559 Vitamin D deficiency, unspecified: Secondary | ICD-10-CM

## 2011-01-20 MED ORDER — HYDROCODONE-ACETAMINOPHEN 5-500 MG PO TABS: 1.0000 | ORAL_TABLET | Freq: Three times a day (TID) | ORAL | Status: DC | PRN

## 2011-01-20 NOTE — Assessment & Plan Note (Signed)
She uses prn vicodin, about 5 pills a week.  rx written with sedation caution.

## 2011-01-20 NOTE — Assessment & Plan Note (Addendum)
A1c at goal.  Eat right diet given to patient and d/w them about titration of lantus.  >25 min spent with patient, at least half of which was spent on counseling ZO:XWRU.

## 2011-01-20 NOTE — Progress Notes (Signed)
Diabetes:  Using medications without difficulties:yes Hypoglycemic episodes: rare Hyperglycemic episodes:rare Feet problems:no Blood Sugars averaging:80-100 over last few weeks.  They have adjusted her insulin by 2 units as needed.   PMH and SH reviewed  Meds, vitals, and allergies reviewed.   ROS: See HPI.  Otherwise negative.    GEN: nad, alert, pleasant HEENT: mucous membranes moist NECK: supple w/o LA CV: rrr. PULM: ctab, no inc wob ABD: soft, +bs EXT: no edema SKIN: no acute rash Chronic postural changes noted  Diabetic foot exam: Normal inspection No skin breakdown No calluses  Normal DP pulses Symmetric dec in sensation to light tough and monofilament on feet Nails normal

## 2011-01-20 NOTE — Patient Instructions (Signed)
Schedule a follow up appointment in 3-4 months with fasting labs ahead of time.  Take the vicodin as needed for your back pain.  It can make you drowsy and constipated.  Use the eat right diet and adjust your lantus as needed.  Take care.  Glad to see you today.

## 2011-01-20 NOTE — Assessment & Plan Note (Signed)
Mobility is limited and parking form application filled out.

## 2011-05-20 ENCOUNTER — Other Ambulatory Visit (INDEPENDENT_AMBULATORY_CARE_PROVIDER_SITE_OTHER): Payer: Medicare Other | Admitting: Family Medicine

## 2011-05-20 DIAGNOSIS — E119 Type 2 diabetes mellitus without complications: Secondary | ICD-10-CM

## 2011-05-20 DIAGNOSIS — E559 Vitamin D deficiency, unspecified: Secondary | ICD-10-CM

## 2011-05-20 LAB — LIPID PANEL
Cholesterol: 117 mg/dL (ref 0–200)
HDL: 35.3 mg/dL — ABNORMAL LOW (ref >39.00)
LDL Cholesterol: 51 mg/dL (ref 0–99)
Triglycerides: 153 mg/dL — ABNORMAL HIGH (ref 0.0–149.0)
VLDL: 30.6 mg/dL (ref 0.0–40.0)

## 2011-05-20 LAB — COMPREHENSIVE METABOLIC PANEL
ALT: 13 U/L (ref 0–35)
Alkaline Phosphatase: 89 U/L (ref 39–117)
CO2: 29 mEq/L (ref 19–32)
Creatinine, Ser: 0.7 mg/dL (ref 0.4–1.2)
GFR: 87.57 mL/min (ref >60.00)
Sodium: 147 mEq/L — ABNORMAL HIGH (ref 135–145)
Total Bilirubin: 0.6 mg/dL (ref 0.3–1.2)
Total Protein: 7.1 g/dL (ref 6.0–8.3)

## 2011-05-20 NOTE — Progress Notes (Signed)
Addended by: Baldomero Lamy on: 05/20/2011 10:32 AM   Modules accepted: Orders

## 2011-05-27 ENCOUNTER — Encounter: Payer: Self-pay | Admitting: Family Medicine

## 2011-05-27 ENCOUNTER — Ambulatory Visit (INDEPENDENT_AMBULATORY_CARE_PROVIDER_SITE_OTHER): Payer: Medicare Other | Admitting: Family Medicine

## 2011-05-27 DIAGNOSIS — E1340 Other specified diabetes mellitus with diabetic neuropathy, unspecified: Secondary | ICD-10-CM

## 2011-05-27 DIAGNOSIS — I6350 Cerebral infarction due to unspecified occlusion or stenosis of unspecified cerebral artery: Secondary | ICD-10-CM

## 2011-05-27 DIAGNOSIS — I639 Cerebral infarction, unspecified: Secondary | ICD-10-CM

## 2011-05-27 DIAGNOSIS — Z13820 Encounter for screening for osteoporosis: Secondary | ICD-10-CM

## 2011-05-27 DIAGNOSIS — I10 Essential (primary) hypertension: Secondary | ICD-10-CM

## 2011-05-27 DIAGNOSIS — Z780 Asymptomatic menopausal state: Secondary | ICD-10-CM

## 2011-05-27 DIAGNOSIS — Z1231 Encounter for screening mammogram for malignant neoplasm of breast: Secondary | ICD-10-CM

## 2011-05-27 DIAGNOSIS — Z124 Encounter for screening for malignant neoplasm of cervix: Secondary | ICD-10-CM

## 2011-05-27 DIAGNOSIS — Z1239 Encounter for other screening for malignant neoplasm of breast: Secondary | ICD-10-CM

## 2011-05-27 DIAGNOSIS — E1149 Type 2 diabetes mellitus with other diabetic neurological complication: Secondary | ICD-10-CM

## 2011-05-27 DIAGNOSIS — Z1211 Encounter for screening for malignant neoplasm of colon: Secondary | ICD-10-CM

## 2011-05-27 DIAGNOSIS — E119 Type 2 diabetes mellitus without complications: Secondary | ICD-10-CM

## 2011-05-27 DIAGNOSIS — Z23 Encounter for immunization: Secondary | ICD-10-CM

## 2011-05-27 NOTE — Progress Notes (Signed)
Diabetes:  Using medications without difficulties:yes Hypoglycemic episodes:no Hyperglycemic episodes:no Feet problems:at baseline, no acute changes Blood Sugars averaging: 60-100 in AM  Hypertension:    Using medication without problems or lightheadedness: yes Chest pain with exertion:no Edema:no Short of breath:no  Elevated Cholesterol: Using medications without problems:yes Muscle aches: no  H/o CVA.  Goal for secondary prevention.   D/w pt today.   R shoulder pain is recently better, she has taken the vicodin intermittently .   PMH and SH reviewed  Meds, vitals, and allergies reviewed.   ROS: See HPI.  Otherwise negative.    GEN: nad, alert and oriented, overweight HEENT: mucous membranes moist NECK: no LA CV: rrr. PULM: ctab, no inc wob ABD: soft, +bs EXT: no edema SKIN: no acute rash Umbilical hernia noted, soft, not ttp Breast exam: No mass, nodules, thickening, tenderness, bulging, retraction, inflamation, nipple discharge or skin changes noted.  No axillary or clavicular LA.  Chaperoned exam.   Diabetic foot exam: Normal inspection No skin breakdown No calluses  Normal DP pulses Mild dec in sensation to light touch and monofilament Nails normal

## 2011-05-27 NOTE — Patient Instructions (Signed)
I would get a flu shot each fall.   Check with your insurance to see if they will cover the shingles shot. See Shirlee Limerick about your referral before your leave today. It's okay to take the pain medicine for your shoulder.  You can take 1/2 tab at a time.  I would recheck your A1c in about 4 months before a OV.  Take care.

## 2011-05-28 ENCOUNTER — Encounter: Payer: Self-pay | Admitting: Family Medicine

## 2011-05-28 DIAGNOSIS — Z124 Encounter for screening for malignant neoplasm of cervix: Secondary | ICD-10-CM | POA: Insufficient documentation

## 2011-05-28 DIAGNOSIS — Z1211 Encounter for screening for malignant neoplasm of colon: Secondary | ICD-10-CM | POA: Insufficient documentation

## 2011-05-28 NOTE — Assessment & Plan Note (Signed)
Sensation intact today, will follow.

## 2011-05-28 NOTE — Assessment & Plan Note (Signed)
Cont current meds.

## 2011-05-28 NOTE — Assessment & Plan Note (Signed)
Continue secondary prevention 

## 2011-05-28 NOTE — Assessment & Plan Note (Signed)
Controlled, cont current meds.

## 2011-05-28 NOTE — Assessment & Plan Note (Signed)
D/w patient re:options for colon cancer screening, including IFOB vs. colonoscopy.  Risks and benefits of both were discussed and patient voiced understanding.  Pt elects for:IFOB  

## 2011-06-04 ENCOUNTER — Other Ambulatory Visit: Payer: Self-pay | Admitting: Family Medicine

## 2011-06-04 ENCOUNTER — Telehealth: Payer: Self-pay | Admitting: Family Medicine

## 2011-06-04 ENCOUNTER — Other Ambulatory Visit: Payer: Medicare Other

## 2011-06-04 DIAGNOSIS — R195 Other fecal abnormalities: Secondary | ICD-10-CM

## 2011-06-04 DIAGNOSIS — Z1289 Encounter for screening for malignant neoplasm of other sites: Secondary | ICD-10-CM

## 2011-06-04 NOTE — Telephone Encounter (Signed)
Please notify Pt.  IFOB was positive.  I put in the referral for GI for eval.  Thanks.

## 2011-06-04 NOTE — Telephone Encounter (Signed)
Patient advised of results and that Shirlee Limerick would likely be calling with the referral appt.

## 2011-06-12 ENCOUNTER — Inpatient Hospital Stay: Admission: RE | Admit: 2011-06-12 | Payer: Medicare Other | Source: Ambulatory Visit

## 2011-06-12 ENCOUNTER — Ambulatory Visit
Admission: RE | Admit: 2011-06-12 | Discharge: 2011-06-12 | Disposition: A | Discharge status: Home / Self Care | Payer: Medicare Other | Source: Ambulatory Visit | Attending: Family Medicine | Admitting: Family Medicine

## 2011-06-12 DIAGNOSIS — Z1231 Encounter for screening mammogram for malignant neoplasm of breast: Secondary | ICD-10-CM

## 2011-06-17 ENCOUNTER — Encounter: Payer: Self-pay | Admitting: *Deleted

## 2011-06-23 ENCOUNTER — Telehealth: Payer: Self-pay | Admitting: *Deleted

## 2011-06-23 NOTE — Telephone Encounter (Signed)
Pt is asking for order for zostavax to take to a pharmacy.  She checked with her insurance and it will be covered if she gets it at a pharmacy.  Please call when ready for pick up.

## 2011-06-25 MED ORDER — ZOSTER VACCINE LIVE 19400 UNT/0.65ML ~~LOC~~ SOLR: 0.6500 mL | Freq: Once | SUBCUTANEOUS | Status: DC

## 2011-06-25 NOTE — Telephone Encounter (Signed)
Left message on machine for patient to call back.

## 2011-06-25 NOTE — Telephone Encounter (Signed)
Patient notified that rx has been sent to the pharmacy. 

## 2011-06-30 ENCOUNTER — Telehealth: Payer: Self-pay | Admitting: Internal Medicine

## 2011-06-30 ENCOUNTER — Ambulatory Visit: Payer: Medicare Other | Admitting: Internal Medicine

## 2011-06-30 NOTE — Telephone Encounter (Signed)
Do not charge thanks

## 2011-07-02 ENCOUNTER — Encounter: Payer: Self-pay | Admitting: Internal Medicine

## 2011-07-02 NOTE — Telephone Encounter (Signed)
Error

## 2011-09-02 ENCOUNTER — Other Ambulatory Visit: Payer: Self-pay | Admitting: *Deleted

## 2011-09-03 MED ORDER — HYDROCODONE-ACETAMINOPHEN 5-500 MG PO TABS: 1.0000 | ORAL_TABLET | Freq: Three times a day (TID) | ORAL | Status: DC | PRN

## 2011-09-03 NOTE — Telephone Encounter (Signed)
Medication phoned to pharmacy.  

## 2011-09-03 NOTE — Telephone Encounter (Signed)
please call in

## 2011-09-22 ENCOUNTER — Other Ambulatory Visit: Payer: Medicare Other

## 2011-09-22 ENCOUNTER — Other Ambulatory Visit (INDEPENDENT_AMBULATORY_CARE_PROVIDER_SITE_OTHER): Payer: Medicare Other

## 2011-09-22 DIAGNOSIS — E119 Type 2 diabetes mellitus without complications: Secondary | ICD-10-CM

## 2011-09-22 LAB — HEMOGLOBIN A1C: Hgb A1c MFr Bld: 6.2 % (ref 4.6–6.5)

## 2011-09-26 ENCOUNTER — Encounter: Payer: Self-pay | Admitting: Family Medicine

## 2011-09-26 ENCOUNTER — Ambulatory Visit (INDEPENDENT_AMBULATORY_CARE_PROVIDER_SITE_OTHER): Payer: Medicare Other | Admitting: Family Medicine

## 2011-09-26 VITALS — BP 118/72 | HR 82 | Temp 98.5°F | Wt 236.0 lb

## 2011-09-26 DIAGNOSIS — M25511 Pain in right shoulder: Secondary | ICD-10-CM

## 2011-09-26 DIAGNOSIS — M25519 Pain in unspecified shoulder: Secondary | ICD-10-CM

## 2011-09-26 DIAGNOSIS — E119 Type 2 diabetes mellitus without complications: Secondary | ICD-10-CM

## 2011-09-26 MED ORDER — "INSULIN SYRINGE 31G X 5/16"" 1 ML MISC": Status: DC

## 2011-09-26 MED ORDER — ASPIRIN-DIPYRIDAMOLE ER 25-200 MG PO CP12: 1.0000 | ORAL_CAPSULE | Freq: Two times a day (BID) | ORAL | Status: DC

## 2011-09-26 MED ORDER — INSULIN GLARGINE 100 UNIT/ML ~~LOC~~ SOLN: 66.0000 [IU] | Freq: Every day | SUBCUTANEOUS | Status: DC

## 2011-09-26 MED ORDER — FUROSEMIDE 20 MG PO TABS: 20.0000 mg | ORAL_TABLET | Freq: Every day | ORAL | Status: DC

## 2011-09-26 MED ORDER — NIFEDIPINE ER 60 MG PO TB24: 60.0000 mg | ORAL_TABLET | Freq: Every day | ORAL | Status: DC

## 2011-09-26 MED ORDER — SIMVASTATIN 20 MG PO TABS: 20.0000 mg | ORAL_TABLET | Freq: Every day | ORAL | Status: DC

## 2011-09-26 MED ORDER — PIOGLITAZONE HCL 45 MG PO TABS: 45.0000 mg | ORAL_TABLET | Freq: Every day | ORAL | Status: DC

## 2011-09-26 MED ORDER — LANCETS THIN MISC: Status: DC

## 2011-09-26 MED ORDER — CARVEDILOL 6.25 MG PO TABS: 6.2500 mg | ORAL_TABLET | Freq: Two times a day (BID) | ORAL | Status: DC

## 2011-09-26 MED ORDER — LISINOPRIL 40 MG PO TABS: 40.0000 mg | ORAL_TABLET | Freq: Every day | ORAL | Status: DC

## 2011-09-26 MED ORDER — METFORMIN HCL 1000 MG PO TABS: 1000.0000 mg | ORAL_TABLET | Freq: Every day | ORAL | Status: DC

## 2011-09-26 MED ORDER — NYSTATIN 100000 UNIT/GM EX OINT: TOPICAL_OINTMENT | Freq: Two times a day (BID) | CUTANEOUS | Status: DC

## 2011-09-26 MED ORDER — VENLAFAXINE HCL ER 150 MG PO CP24: 150.0000 mg | ORAL_CAPSULE | Freq: Every day | ORAL | Status: DC

## 2011-09-26 MED ORDER — AMITRIPTYLINE HCL 25 MG PO TABS: 25.0000 mg | ORAL_TABLET | Freq: Every day | ORAL | Status: DC

## 2011-09-26 NOTE — Progress Notes (Signed)
Shoulder pain has been better recently. Has needed less pain medicine.   Diabetes:  Using medications without difficulties:yes Hypoglycemic episodes:no Hyperglycemic episodes:no Feet problems:no Blood Sugars averaging: 80-120 in AM She working on diet and calorie counting.    She needed mult meds refilled today.  Printed and given to patient, along with rx written for strips and meter.   PMH and SH reviewed  Meds, vitals, and allergies reviewed.   ROS: See HPI.  Otherwise negative.    GEN: nad, alert and oriented, chronic postural changes noted HEENT: mucous membranes moist NECK: supple w/o LA CV: rrr. PULM: ctab, no inc wob ABD: soft, +bs EXT: trace edema SKIN: no acute rash, but chronic changes to shins noted x2

## 2011-09-26 NOTE — Patient Instructions (Signed)
Let's recheck your labs in 6 months and then meet after that.

## 2011-09-28 ENCOUNTER — Encounter: Payer: Self-pay | Admitting: Family Medicine

## 2011-09-28 NOTE — Assessment & Plan Note (Signed)
>  25 min spent with face to face with patient, >50% counseling and/or coordinating care.  rx's refilled.  A1c d/w pt, controlled, continue current meds.  She wants to calorie count and dec total intake. This is reasonable, but I wouldn't dec her total by more than 100cal to prevent hypoglycemia.  She can adjust her lantus by 2 units per day if needed.  She agrees, call back with questions.  Recheck labs in 6 months.

## 2011-09-28 NOTE — Assessment & Plan Note (Signed)
Recently with less need for pain meds.

## 2012-02-02 ENCOUNTER — Other Ambulatory Visit: Payer: Self-pay | Admitting: *Deleted

## 2012-02-02 MED ORDER — HYDROCODONE-ACETAMINOPHEN 5-500 MG PO TABS: 1.0000 | ORAL_TABLET | Freq: Three times a day (TID) | ORAL | Status: DC | PRN

## 2012-02-02 NOTE — Telephone Encounter (Signed)
Received faxed refill request from pharmacy. Is it okay to refill medication? 

## 2012-02-02 NOTE — Telephone Encounter (Signed)
Please call in

## 2012-02-03 NOTE — Telephone Encounter (Signed)
Medication phoned to pharmacy.  

## 2012-02-20 ENCOUNTER — Telehealth: Payer: Self-pay | Admitting: Family Medicine

## 2012-02-20 MED ORDER — NAPROXEN SODIUM 220 MG PO TABS: 220.0000 mg | ORAL_TABLET | Freq: Every day | ORAL | Status: DC

## 2012-02-20 NOTE — Telephone Encounter (Signed)
Husband asked on behalf of patient.  Can she take aleve for pain.  It may inc bruising/bleeding, would need to talk with food and drink plenty of water.  Would be okay to take 1 a day with GI caution.  D/w him about ACE/NSAID interaction.

## 2012-03-26 ENCOUNTER — Other Ambulatory Visit: Payer: Medicare Other

## 2012-03-26 ENCOUNTER — Other Ambulatory Visit (INDEPENDENT_AMBULATORY_CARE_PROVIDER_SITE_OTHER): Payer: Medicare Other

## 2012-03-26 DIAGNOSIS — E119 Type 2 diabetes mellitus without complications: Secondary | ICD-10-CM

## 2012-03-26 LAB — COMPREHENSIVE METABOLIC PANEL
ALT: 9 U/L (ref 0–35)
AST: 15 U/L (ref 0–37)
CO2: 26 mEq/L (ref 19–32)
GFR: 79.54 mL/min (ref >60.00)
Sodium: 143 mEq/L (ref 135–145)
Total Bilirubin: 0.4 mg/dL (ref 0.3–1.2)
Total Protein: 7.2 g/dL (ref 6.0–8.3)

## 2012-03-26 LAB — LIPID PANEL
HDL: 41.2 mg/dL (ref >39.00)
LDL Cholesterol: 42 mg/dL (ref 0–99)
Total CHOL/HDL Ratio: 3

## 2012-04-02 ENCOUNTER — Encounter: Payer: Self-pay | Admitting: Family Medicine

## 2012-04-02 ENCOUNTER — Ambulatory Visit (INDEPENDENT_AMBULATORY_CARE_PROVIDER_SITE_OTHER): Payer: Medicare Other | Admitting: Family Medicine

## 2012-04-02 VITALS — BP 142/76 | HR 76 | Temp 98.2°F | Wt 229.0 lb

## 2012-04-02 DIAGNOSIS — E119 Type 2 diabetes mellitus without complications: Secondary | ICD-10-CM

## 2012-04-02 DIAGNOSIS — I10 Essential (primary) hypertension: Secondary | ICD-10-CM

## 2012-04-02 DIAGNOSIS — E785 Hyperlipidemia, unspecified: Secondary | ICD-10-CM

## 2012-04-02 MED ORDER — PIOGLITAZONE HCL 45 MG PO TABS: 22.5000 mg | ORAL_TABLET | Freq: Every day | ORAL | Status: DC

## 2012-04-02 NOTE — Assessment & Plan Note (Signed)
Continue simvastatin.  Labs d/wpt.  Lipids controlled.  Continue work on diet.  Exercise is unfortunately limited.

## 2012-04-02 NOTE — Patient Instructions (Addendum)
Recheck A1c in 3-4 months at an office visit with Para March. You don't need to fast. Take care. Glad to see you.

## 2012-04-02 NOTE — Progress Notes (Signed)
Diabetes:  Using medications without difficulties: yes Hypoglycemic episodes: occ Hyperglycemic episodes: no Feet problems: no Blood Sugars averaging: 70-90 usually, occ lower.  eye exam within last year: no, d/w pt.    Hypertension:    Using medication without problems or lightheadedness: yes Chest pain with exertion:no Edema: occ, rare, limited to feet later in the day Short of breath:no  Elevated Cholesterol: Using medications without problems:yes Muscle aches: at baseline, likely not from statin Diet compliance: yes Exercise: limited by prev injuries.   PMH and SH reviewed.   Vital signs, Meds and allergies reviewed.  ROS: See HPI.  Otherwise nontributory.   GEN: nad, alert and oriented HEENT: mucous membranes moist NECK: supple w/o LA Dec in shoulder rom at baseline B CV: rrr. PULM: ctab, no inc wob ABD: soft, +bs, obese EXT: no edema SKIN: no acute rash  Diabetic foot exam: Normal inspection No skin breakdown No calluses  Normal DP pulses Normal sensation to light tough and monofilament Nails normal

## 2012-04-02 NOTE — Assessment & Plan Note (Signed)
Controlled with occ lows.  D/w pt.  Would cut back to 22.5mg  of actos in meantime.  If sig elevation, then restart 45mg  a day.  Continue ~60mg  of lantus in meantime with adjustment as needed.  Continue metformin.  Continue work on diet.  Exercise is unfortunately limited.

## 2012-04-02 NOTE — Assessment & Plan Note (Signed)
Acceptable, I don't want to induce hypotension.  Continue work on diet.  Exercise is unfortunately limited.

## 2012-06-22 ENCOUNTER — Encounter: Payer: Self-pay | Admitting: Family Medicine

## 2012-06-22 DIAGNOSIS — E11319 Type 2 diabetes mellitus with unspecified diabetic retinopathy without macular edema: Secondary | ICD-10-CM | POA: Insufficient documentation

## 2012-07-13 ENCOUNTER — Encounter: Payer: Self-pay | Admitting: Family Medicine

## 2012-07-13 ENCOUNTER — Ambulatory Visit (INDEPENDENT_AMBULATORY_CARE_PROVIDER_SITE_OTHER): Payer: Medicare Other | Admitting: Family Medicine

## 2012-07-13 VITALS — BP 112/72 | HR 81 | Temp 98.1°F | Wt 219.8 lb

## 2012-07-13 DIAGNOSIS — Z23 Encounter for immunization: Secondary | ICD-10-CM

## 2012-07-13 DIAGNOSIS — E119 Type 2 diabetes mellitus without complications: Secondary | ICD-10-CM

## 2012-07-13 NOTE — Patient Instructions (Signed)
Don't change your meds for now.  We'll contact you with your lab report. Plan on coming back in 2/14 for a visit.  Take care.

## 2012-07-14 NOTE — Progress Notes (Signed)
Diabetes:  Using medications without difficulties:yes Hypoglycemic episodes:rare Hyperglycemic episodes:no Feet problems:no Blood Sugars averaging: usually 80-120 in AM Had been cut back from 45 to 22.5 of actos.  Doing well.   Losing weight intentionally. Portion control and diet choices.   Meds, vitals, and allergies reviewed.   ROS: See HPI.  Otherwise negative.    GEN: nad, alert and oriented HEENT: mucous membranes moist NECK: supple w/o LA CV: rrr. PULM: ctab, no inc wob EXT: no edema Limited ROM in R shoulder, at baseline  Diabetic foot exam: Normal inspection No skin breakdown No calluses  Normal DP pulses Normal sensation to light touch and monofilament Nails normal

## 2012-07-14 NOTE — Assessment & Plan Note (Signed)
Minimal inc in A1c, she'd like to come off actos.  Reasonable to try.  Continue work on diet.  If glucose elevated on home checks can restart actos.  Will recheck in 2/14.  She agrees.

## 2012-08-23 ENCOUNTER — Other Ambulatory Visit: Payer: Self-pay | Admitting: *Deleted

## 2012-08-23 MED ORDER — HYDROCODONE-ACETAMINOPHEN 5-500 MG PO TABS: 1.0000 | ORAL_TABLET | Freq: Three times a day (TID) | ORAL | Status: DC | PRN

## 2012-08-23 NOTE — Telephone Encounter (Signed)
Please call in

## 2012-08-24 NOTE — Telephone Encounter (Signed)
Medication phoned to pharmacy.  

## 2012-11-26 ENCOUNTER — Ambulatory Visit: Payer: Medicare Other | Admitting: Family Medicine

## 2012-12-01 ENCOUNTER — Ambulatory Visit (INDEPENDENT_AMBULATORY_CARE_PROVIDER_SITE_OTHER): Payer: Medicare Other | Admitting: Family Medicine

## 2012-12-01 ENCOUNTER — Encounter: Payer: Self-pay | Admitting: Family Medicine

## 2012-12-01 VITALS — BP 110/80 | HR 72 | Temp 98.5°F | Wt 214.0 lb

## 2012-12-01 DIAGNOSIS — F329 Major depressive disorder, single episode, unspecified: Secondary | ICD-10-CM

## 2012-12-01 DIAGNOSIS — M25511 Pain in right shoulder: Secondary | ICD-10-CM

## 2012-12-01 DIAGNOSIS — E119 Type 2 diabetes mellitus without complications: Secondary | ICD-10-CM

## 2012-12-01 MED ORDER — NIFEDIPINE ER 60 MG PO TB24: 60.0000 mg | ORAL_TABLET | Freq: Every day | ORAL | Status: DC

## 2012-12-01 MED ORDER — LANCETS THIN MISC: Status: DC

## 2012-12-01 MED ORDER — GLUCOSE BLOOD VI STRP: ORAL_STRIP | Status: DC

## 2012-12-01 MED ORDER — CARVEDILOL 6.25 MG PO TABS: 6.2500 mg | ORAL_TABLET | Freq: Two times a day (BID) | ORAL | Status: DC

## 2012-12-01 MED ORDER — VENLAFAXINE HCL ER 150 MG PO CP24: 150.0000 mg | ORAL_CAPSULE | Freq: Every day | ORAL | Status: DC

## 2012-12-01 MED ORDER — PIOGLITAZONE HCL 45 MG PO TABS: 22.5000 mg | ORAL_TABLET | Freq: Every day | ORAL | Status: DC

## 2012-12-01 MED ORDER — AMITRIPTYLINE HCL 25 MG PO TABS: 25.0000 mg | ORAL_TABLET | Freq: Every day | ORAL | Status: DC

## 2012-12-01 MED ORDER — NYSTATIN 100000 UNIT/GM EX OINT: TOPICAL_OINTMENT | Freq: Two times a day (BID) | CUTANEOUS | Status: DC

## 2012-12-01 MED ORDER — FUROSEMIDE 20 MG PO TABS: 20.0000 mg | ORAL_TABLET | Freq: Every day | ORAL | Status: DC

## 2012-12-01 MED ORDER — METFORMIN HCL 1000 MG PO TABS: 1000.0000 mg | ORAL_TABLET | Freq: Every day | ORAL | Status: DC

## 2012-12-01 MED ORDER — LISINOPRIL 40 MG PO TABS: 40.0000 mg | ORAL_TABLET | Freq: Every day | ORAL | Status: DC

## 2012-12-01 MED ORDER — SIMVASTATIN 20 MG PO TABS: 20.0000 mg | ORAL_TABLET | Freq: Every day | ORAL | Status: DC

## 2012-12-01 MED ORDER — HYDROCODONE-ACETAMINOPHEN 5-500 MG PO TABS: 1.0000 | ORAL_TABLET | Freq: Three times a day (TID) | ORAL | Status: DC | PRN

## 2012-12-01 MED ORDER — ASPIRIN-DIPYRIDAMOLE ER 25-200 MG PO CP12: 1.0000 | ORAL_CAPSULE | Freq: Two times a day (BID) | ORAL | Status: DC

## 2012-12-01 MED ORDER — INSULIN GLARGINE 100 UNIT/ML ~~LOC~~ SOLN: 56.0000 [IU] | Freq: Every day | SUBCUTANEOUS | Status: DC

## 2012-12-01 MED ORDER — "INSULIN SYRINGE 31G X 5/16"" 1 ML MISC": Status: DC

## 2012-12-01 NOTE — Assessment & Plan Note (Signed)
Affect is appropriate, continue current meds.

## 2012-12-01 NOTE — Assessment & Plan Note (Signed)
chronic-uses vicodin for pain control without ADE.  Would continue as is for now.

## 2012-12-01 NOTE — Patient Instructions (Addendum)
Schedule a physical this summer.   We'll contact you with your lab report. Don't adjust your lantus yet.  Take care.

## 2012-12-01 NOTE — Progress Notes (Signed)
Diabetes:  Using medications without difficulties:yes, improved control back on actos.   Hypoglycemic episodes:no Hyperglycemic episodes:no Feet problems:no Blood Sugars averaging: see log.   She continues to work on her diet.  Her amount of exercise is very limited due to MSK conditions.    Her shoulder pain continues and is tolerable with current meds, no ADE on vicodin and it helps make the pain manageable.    We talked about mammogram and DXA.  She would consider if she could be positioned in the machines w/o sig pain.  She had pain with prev mammogram and I told her I'd look into this.    Meds, vitals, and allergies reviewed.   ROS: See HPI.  Otherwise negative.    GEN: nad, alert and oriented, overweight Minimal ROM on R shoulder HEENT: mucous membranes moist NECK: supple w/o LA CV: rrr. PULM: ctab, no inc wob ABD: soft, +bs EXT: no edema Affect wnl  Diabetic foot exam: Normal inspection No skin breakdown No calluses  Normal DP pulses Normal sensation to light touch and monofilament Nails normal

## 2012-12-01 NOTE — Assessment & Plan Note (Signed)
See notes on A1c when resulted.  Continue as is for now with current lantus dose.  D/w pt.  See sugar log.

## 2012-12-03 ENCOUNTER — Telehealth: Payer: Self-pay | Admitting: Family Medicine

## 2012-12-03 NOTE — Telephone Encounter (Signed)
Then will not order DXA.  Will defer to patient on mammogram.

## 2012-12-03 NOTE — Telephone Encounter (Signed)
Message copied by Joaquim Nam on Fri Dec 03, 2012 10:25 AM ------      Message from: Carlton Adam      Created: Thu Dec 02, 2012  4:17 PM       Called the patient and gave her the phone number to [facility]. Told her they can do a mammogram in a sitting position but the dexa scan you have to lie flat on the table so if she cant lie flat no point in scheduling that one.       ----- Message -----         From: Joaquim Nam, MD         Sent: 12/01/2012   9:01 PM           To: Marion Kolovrat            Pt has a lot of MSK problems and the last mammogram hurt.  She was asking about options for this and for DXA.  Can she get a number to call the [facility] to discuss her situation with them, to see if they can potentially help her?            Please talk to me about this.  Thanks.         ------

## 2013-03-13 LAB — HM DIABETES FOOT EXAM

## 2013-03-30 ENCOUNTER — Telehealth: Payer: Self-pay | Admitting: Family Medicine

## 2013-03-30 DIAGNOSIS — I639 Cerebral infarction, unspecified: Secondary | ICD-10-CM

## 2013-03-30 NOTE — Telephone Encounter (Signed)
Family conference today.  Pt not present.  Gave consent to talk to family.  2 daughters and 1 son present.   Per family, Yvette Gutierrez considering Hospice vs Onc tx.  D/w family about his goals.  If goals for comfort and home care, hospice may be reasonable.   He had been caring for wife and this will change in near future.  He is progressively weak.  He is walking less.  Sleeping more, has black stools.  Prognosis is grim.  Family asked about options.    Yvette Gutierrez to consider Hospice vs Onc tx.  We'll work on options for care of his wife; family to talk to her first.  [facility] safety eval likely a reasonable start.  Number given for [facility].

## 2013-03-31 NOTE — Telephone Encounter (Signed)
Shelle Iron pts daughter left v/m that pt is agreeable to meet with home health and Rosey Bath request referral for home health and Rosey Bath request call back.

## 2013-03-31 NOTE — Addendum Note (Signed)
Addended by: Joaquim Nam on: 03/31/2013 01:47 PM   Modules accepted: Orders

## 2013-03-31 NOTE — Telephone Encounter (Signed)
Referral placed.

## 2013-04-05 ENCOUNTER — Encounter: Payer: Self-pay | Admitting: Radiology

## 2013-04-05 ENCOUNTER — Encounter: Payer: Self-pay | Admitting: Family Medicine

## 2013-04-05 ENCOUNTER — Ambulatory Visit (INDEPENDENT_AMBULATORY_CARE_PROVIDER_SITE_OTHER): Payer: Medicare Other | Admitting: Family Medicine

## 2013-04-05 VITALS — BP 126/78 | HR 78 | Temp 97.8°F | Wt 222.5 lb

## 2013-04-05 DIAGNOSIS — E119 Type 2 diabetes mellitus without complications: Secondary | ICD-10-CM

## 2013-04-05 DIAGNOSIS — R0683 Snoring: Secondary | ICD-10-CM

## 2013-04-05 DIAGNOSIS — R269 Unspecified abnormalities of gait and mobility: Secondary | ICD-10-CM

## 2013-04-05 DIAGNOSIS — R0609 Other forms of dyspnea: Secondary | ICD-10-CM

## 2013-04-05 NOTE — Patient Instructions (Addendum)
We'll work on getting home health set up.  Make a list of the activities you can go well and another for the items that will need some work.  Go to the lab on the way out.  We'll contact you with your lab report. Think about seeing pulmonary and let me know if you want some help getting the sleep test set up.   Take care.

## 2013-04-06 ENCOUNTER — Encounter: Payer: Self-pay | Admitting: *Deleted

## 2013-04-06 DIAGNOSIS — G4733 Obstructive sleep apnea (adult) (pediatric): Secondary | ICD-10-CM | POA: Insufficient documentation

## 2013-04-06 NOTE — Assessment & Plan Note (Signed)
See notes on labs. 

## 2013-04-06 NOTE — Assessment & Plan Note (Signed)
Tabled for now, consider pulm referral later on.  She agrees.

## 2013-04-06 NOTE — Assessment & Plan Note (Signed)
Given the fall risk and the upheaval at home, it would be reasonable for [facility] eval, PT.  She can't leave the house on her own, doesn't drive.  We talked about making a list of currently possible ADLs, IADLs and then making a list of limited activities as a place to start.  We'll work on getting [facility] ordered.  She agrees with plan.    I talked with her about her husband's illness and she'll consider talking with counseling from hospice.  I offered my support.  She thanked me. >25 min spent with face to face with patient, >50% counseling and/or coordinating care

## 2013-04-06 NOTE — Progress Notes (Signed)
DM2, due for A1c.  D/w pt.  See notes on labs.   H/o CVA with chronic unsteadiness, shoulder pain with limited shoulder ROM, chronic back pain.  Living at home.  Husband had been handling a lot of her care.  He was recently dx'd with cancer, on hospice.  Wife is understandably worried about this.    Here today for eval about HH. She can walk max 50 feet, sometimes less.  Unable to fully abduct her arms.  No recent falls.  She has trouble with showering, hasn't been cooking.  Continent but cleaning up is difficult due to her shoulder immobility.   I also found out today that she snores frequently.  Not gasping for air at night.  No clear h/o OSA.  Discussed possible referral to pulm about this.    Meds, vitals, and allergies reviewed.   ROS: See HPI.  Otherwise, noncontributory.  nad ncat Mmm rrr ctab Abd soft Unable to abduct the shoulders  Gait is slightly unsteady with wide based gait

## 2013-04-12 ENCOUNTER — Telehealth: Payer: Self-pay | Admitting: *Deleted

## 2013-04-12 NOTE — Telephone Encounter (Signed)
PT was ordered for the patient and therapist says that she would like to recommend a nurse for medication management and OT for personal care management.  The patient has been dependent on her husband for managing medications and also for bathing and grooming and husband is under Hospice care now.  Please advise.

## 2013-04-13 NOTE — Telephone Encounter (Signed)
Order given to [facility] by telephone as instructed.

## 2013-04-13 NOTE — Telephone Encounter (Signed)
Please give a verbal for a nurse for medication management and OT for personal care management. .  Thanks .

## 2013-04-18 ENCOUNTER — Telehealth: Payer: Self-pay

## 2013-04-18 NOTE — Telephone Encounter (Signed)
pts daughter, Shelle Iron left v/m checking status of w/c request; Rosey Bath said [facility] had requested insurance, diagnosis and demographic sheet.Please advise.

## 2013-04-18 NOTE — Telephone Encounter (Signed)
Left detailed message on voicemail.  

## 2013-04-18 NOTE — Telephone Encounter (Signed)
I don't have this request.  Was it placed in your In Box?

## 2013-04-18 NOTE — Telephone Encounter (Signed)
I don't have anything new. All prev papers/orders were done.  If they have a form I'll work on it.  Let me know.  Thanks.

## 2013-04-19 ENCOUNTER — Telehealth: Payer: Self-pay | Admitting: *Deleted

## 2013-04-19 NOTE — Telephone Encounter (Signed)
Please give the verbal.  Thanks.  

## 2013-04-19 NOTE — Telephone Encounter (Signed)
Robin from [facility] is asking for a referral for a social worker to help the family and patient with community resources, financial assistance and future planning.  A verbal okay is sufficient for now.

## 2013-04-19 NOTE — Telephone Encounter (Signed)
Left detailed message on voicemail.  

## 2013-04-21 ENCOUNTER — Other Ambulatory Visit: Payer: Self-pay

## 2013-04-21 DIAGNOSIS — M25519 Pain in unspecified shoulder: Secondary | ICD-10-CM

## 2013-04-21 DIAGNOSIS — IMO0001 Reserved for inherently not codable concepts without codable children: Secondary | ICD-10-CM

## 2013-04-21 DIAGNOSIS — E1149 Type 2 diabetes mellitus with other diabetic neurological complication: Secondary | ICD-10-CM

## 2013-04-21 DIAGNOSIS — M545 Low back pain: Secondary | ICD-10-CM

## 2013-05-04 ENCOUNTER — Ambulatory Visit (INDEPENDENT_AMBULATORY_CARE_PROVIDER_SITE_OTHER): Payer: Medicare Other | Admitting: Family Medicine

## 2013-05-04 ENCOUNTER — Encounter: Payer: Self-pay | Admitting: Family Medicine

## 2013-05-04 VITALS — BP 138/88 | HR 88 | Temp 98.2°F | Wt 220.5 lb

## 2013-05-04 DIAGNOSIS — G4733 Obstructive sleep apnea (adult) (pediatric): Secondary | ICD-10-CM

## 2013-05-04 DIAGNOSIS — R0683 Snoring: Secondary | ICD-10-CM

## 2013-05-04 DIAGNOSIS — R21 Rash and other nonspecific skin eruption: Secondary | ICD-10-CM

## 2013-05-04 DIAGNOSIS — R269 Unspecified abnormalities of gait and mobility: Secondary | ICD-10-CM

## 2013-05-04 DIAGNOSIS — R0989 Other specified symptoms and signs involving the circulatory and respiratory systems: Secondary | ICD-10-CM

## 2013-05-04 DIAGNOSIS — E119 Type 2 diabetes mellitus without complications: Secondary | ICD-10-CM

## 2013-05-04 MED ORDER — INSULIN PEN NEEDLE 31G X 5 MM MISC: Status: DC

## 2013-05-04 MED ORDER — INSULIN GLARGINE 100 UNIT/ML SOLOSTAR PEN: 74.0000 [IU] | PEN_INJECTOR | Freq: Every day | SUBCUTANEOUS | Status: DC

## 2013-05-04 MED ORDER — NYSTATIN 100000 UNIT/GM EX POWD: CUTANEOUS | Status: DC

## 2013-05-04 NOTE — Progress Notes (Signed)
Husband died.  He "looked after" her.  Sig upheaval at home.  Offered condolences.   DM2.  Would be easier to use pens, discussed this and diet.  She'll price check the pens.   Has OT/PT at home.  Started discussion with patient about long term housing options.  Would be ADL/IADL dependent. Discussed. I'll check with [facility] agency about this.    Rash on R axilla.  Itchy and noted recently.  No fevers.   Family concerned about OSA.  D/w pt.  She consents to OSA eval.   Meds, vitals, and allergies reviewed.   ROS: See HPI.  Otherwise, noncontributory.  nad Limited ROM at the shoulders.  R axilla but not L with typical appearing superficial fungal infection Remainder of exam deferred.

## 2013-05-04 NOTE — Assessment & Plan Note (Signed)
Refer for OSA eval.   

## 2013-05-04 NOTE — Patient Instructions (Addendum)
Yvette Gutierrez will call about the sleep apnea appointment.  Price check the pens.  No change in dose.  Plan to recheck A1c in about 2 months at a visit. You don't need to fast for that visit.  Try to keep your skin dry and clean.  Use the powder on the red areas.  Take care.

## 2013-05-04 NOTE — Assessment & Plan Note (Signed)
They'll price check the pens.

## 2013-05-04 NOTE — Assessment & Plan Note (Signed)
Fungal, start nystatin and f/u prn.  D/w pt about local care.

## 2013-05-04 NOTE — Assessment & Plan Note (Addendum)
I'll talk to [facility] about her progress. >25 min spent with face to face with patient, >50% counseling and/or coordinating care

## 2013-05-05 ENCOUNTER — Telehealth: Payer: Self-pay | Admitting: *Deleted

## 2013-05-05 NOTE — Telephone Encounter (Signed)
Message copied by Annamarie Major on Thu May 05, 2013  9:21 AM ------      Message from: Joaquim Nam      Created: Thu May 05, 2013  7:34 AM       Please call her home health agency and see what their opinion is for her long term needs, ie if she'll likely need SNF, if she could live in ALF, etc.  Thanks.   ------

## 2013-05-05 NOTE — Telephone Encounter (Signed)
Left VM for Yvette Gutierrez, Case Manager at [facility] to return my call with the information requested.
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
## 2013-05-06 NOTE — Telephone Encounter (Signed)
I called and LMOVM.  Will try to call later.

## 2013-05-09 ENCOUNTER — Telehealth: Payer: Self-pay

## 2013-05-09 NOTE — Telephone Encounter (Signed)
I will then await the f/u appt.  Thanks.

## 2013-05-09 NOTE — Telephone Encounter (Signed)
Cindy Social worker [facility] left v/m; pt will need assistance with skilled nursing placement, pts daughter told Arline Asp pt may need surgery  And that should qualify pt for skilled placement. If not able to place pt may need to apply for medicaid. Arline Asp said pts daughter will make a f/u appt with Dr Para March.

## 2013-05-10 ENCOUNTER — Encounter: Payer: Self-pay | Admitting: Family Medicine

## 2013-05-10 ENCOUNTER — Ambulatory Visit (INDEPENDENT_AMBULATORY_CARE_PROVIDER_SITE_OTHER): Payer: Medicare Other | Admitting: Family Medicine

## 2013-05-10 DIAGNOSIS — R269 Unspecified abnormalities of gait and mobility: Secondary | ICD-10-CM

## 2013-05-10 NOTE — Patient Instructions (Addendum)
Mrs. Hipwell- Your family came in today to talk to me.  They and I are concerned about you and your well being.  You will likely need extra help and I have asked your family to check with social work about getting you different housing.  You may need skilled nursing.  I want you to get the help you need.  I believe your family is acting in your best interest out of concern for you.   Please call me if you have questions.   Raechel Ache

## 2013-05-11 ENCOUNTER — Telehealth: Payer: Self-pay

## 2013-05-11 NOTE — Telephone Encounter (Signed)
Amy with [facility] left v/m; pt having problems with application of nyastatin powder; pt request spray form of antifungal. Amy said only spray she is aware of is OTC such as Lotrimen spray.Please advise. Amy request cb and [facility] will notify pt.

## 2013-05-11 NOTE — Assessment & Plan Note (Signed)
With limited mobility and mult concerns.  See prev HH notes.  Advised family to talk with patient and SW about starting bed search to see what is available.  She doesn't sound to need inpatient tx currently.  Family agreed.  Letter to pt on AVS. >15 min spent with face to face with family of patient, >50% counseling and/or coordinating care

## 2013-05-11 NOTE — Telephone Encounter (Signed)
They can try the OTC lotrimen.  They shouldn't need an RX.  Thanks.

## 2013-05-11 NOTE — Progress Notes (Signed)
OV with family.  Pt not in attendance.    Pt with continued limited ability to care for self.  She isn't eating well.  Trouble with self cleaning, mobility, continued candida infection.  Family asking about options.  See plan.

## 2013-05-11 NOTE — Telephone Encounter (Signed)
Rob at [facility] as instructed by telephone. Rob will discuss this with the patient and thinks that she will do better with the Lotrimen Spray.

## 2013-05-13 ENCOUNTER — Ambulatory Visit: Payer: Medicare Other | Admitting: Family Medicine

## 2013-06-03 ENCOUNTER — Other Ambulatory Visit: Payer: Self-pay | Admitting: Family Medicine

## 2013-06-03 NOTE — Telephone Encounter (Signed)
Electronic refill request.  Please advise. 

## 2013-06-03 NOTE — Telephone Encounter (Signed)
Please call in

## 2013-06-06 ENCOUNTER — Other Ambulatory Visit: Payer: Self-pay | Admitting: *Deleted

## 2013-06-06 ENCOUNTER — Telehealth: Payer: Self-pay | Admitting: *Deleted

## 2013-06-06 MED ORDER — HYDROCODONE-ACETAMINOPHEN 5-325 MG PO TABS: 1.0000 | ORAL_TABLET | Freq: Three times a day (TID) | ORAL | Status: DC | PRN

## 2013-06-06 NOTE — Telephone Encounter (Signed)
Okay to make the change.  Please call in.  Thanks.

## 2013-06-06 NOTE — Telephone Encounter (Signed)
Rob from [facility] called to say that Yvette Gutierrez's insurance will not pay for the Hydrocodone/APAP 5/500.  They will only pay for 5/325.  Is it okay to switch the Rx?

## 2013-06-06 NOTE — Telephone Encounter (Signed)
Medication phoned to pharmacy.  

## 2013-06-06 NOTE — Telephone Encounter (Signed)
Done

## 2013-06-14 ENCOUNTER — Other Ambulatory Visit: Payer: Self-pay | Admitting: *Deleted

## 2013-06-14 MED ORDER — HYDROCODONE-ACETAMINOPHEN 5-325 MG PO TABS: 1.0000 | ORAL_TABLET | Freq: Three times a day (TID) | ORAL | Status: DC | PRN

## 2013-06-14 MED ORDER — INSULIN GLARGINE 100 UNIT/ML SOLOSTAR PEN: 74.0000 [IU] | PEN_INJECTOR | Freq: Every day | SUBCUTANEOUS | Status: DC

## 2013-06-14 NOTE — Telephone Encounter (Signed)
Had been filled at [facility].  Thanks.

## 2013-06-14 NOTE — Telephone Encounter (Signed)
Daughter walked in asking for 90 day refills to be printed so [place] [facility] [place] please advise

## 2013-06-14 NOTE — Addendum Note (Signed)
Addended by: Sueanne Margarita on: 06/14/2013 04:20 PM   Modules accepted: Orders

## 2013-06-14 NOTE — Telephone Encounter (Signed)
Per verbal from Dr. Para March, rx's ok, printed he signed and gave to daughter.

## 2013-06-15 ENCOUNTER — Ambulatory Visit (INDEPENDENT_AMBULATORY_CARE_PROVIDER_SITE_OTHER): Payer: Medicare Other | Admitting: Pulmonary Disease

## 2013-06-15 ENCOUNTER — Encounter: Payer: Self-pay | Admitting: Pulmonary Disease

## 2013-06-15 VITALS — BP 108/86 | HR 74 | Temp 98.5°F | Ht 59.5 in | Wt 214.2 lb

## 2013-06-15 DIAGNOSIS — G4733 Obstructive sleep apnea (adult) (pediatric): Secondary | ICD-10-CM

## 2013-06-15 NOTE — Progress Notes (Signed)
Subjective:    Patient ID: Yvette Gutierrez, female    DOB: 11-23-1945, 67 y.o.   MRN: [id number]  HPI The patient is a 67 year old female who have been asked to see for possible obstructive sleep apnea.  She has been noted to have loud snoring, as well as an abnormal breathing pattern during sleep.  She has to sleep in a recliner because of her musculoskeletal issues, but also because her breathing issues.  The patient has not rested in the mornings upon arising, and notes significant inappropriate daytime sleepiness with inactivity.  She also has sleepiness issues in the evening while trying to watch television.  The patient states that her weight is neutral the last 2 years, and her Epworth score today is abnormal at 18.   Sleep Questionnaire What time do you typically go to bed?( Between what hours) 10-11p 10-11p at 1521 on 06/15/13 by Maisie Fus, CMA How long does it take you to fall asleep? 1 hour 1 hour at 1521 on 06/15/13 by Maisie Fus, CMA How many times during the night do you wake up? 1 1 at 1521 on 06/15/13 by Maisie Fus, CMA What time do you get out of bed to start your day? [id number] 8-9a at 1521 on 06/15/13 by Maisie Fus, CMA Do you drive or operate heavy machinery in your occupation? No No at 1521 on 06/15/13 by Maisie Fus, CMA How much has your weight changed (up or down) over the past two years? (In pounds) 20 lb (9.072 kg)20 lb (9.072 kg) decrease at 1521 on 06/15/13 by Maisie Fus, CMA Have you ever had a sleep study before? No No at 1521 on 06/15/13 by Maisie Fus, CMA Do you currently use CPAP? No No at 1521 on 06/15/13 by Maisie Fus, CMA Do you wear oxygen at any time? No No at 1521 on 06/15/13 by Maisie Fus, CMA   Review of Systems  Constitutional: Negative for fever and unexpected weight change.  HENT: Negative for ear pain, nosebleeds, congestion, sore throat, rhinorrhea, sneezing, trouble swallowing, dental problem,  postnasal drip and sinus pressure.   Eyes: Negative for redness and itching.  Respiratory: Positive for cough. Negative for chest tightness, shortness of breath and wheezing.   Cardiovascular: Negative for palpitations and leg swelling.  Gastrointestinal: Negative for nausea and vomiting.  Genitourinary: Negative for dysuria.  Musculoskeletal: Negative for joint swelling.  Skin: Negative for rash.  Neurological: Negative for headaches.  Hematological: Does not bruise/bleed easily.  Psychiatric/Behavioral: Positive for dysphoric mood. The patient is not nervous/anxious.        Objective:   Physical Exam Constitutional:  Morbidly obese female, no acute distress  HENT:  Nares patent without discharge, deviated septum to left with narrowing  Oropharynx without exudate, palate and uvula are thick and elongated.   Eyes:  Perrla, eomi, no scleral icterus  Neck:  No JVD, no TMG  Cardiovascular:  Normal rate, regular rhythm, no rubs or gallops.  No murmurs        Intact distal pulses but diminished.  Pulmonary :  Normal breath sounds, no stridor or respiratory distress   No rales, rhonchi, or wheezing  Abdominal:  Soft, nondistended, bowel sounds present.  No tenderness noted.   Musculoskeletal:  1+ lower extremity edema noted.  Lymph Nodes:  No cervical lymphadenopathy noted  Skin:  No cyanosis noted  Neurologic:  Alert, appropriate, moves all 4 extremities without obvious deficit.  Assessment & Plan:

## 2013-06-15 NOTE — Patient Instructions (Addendum)
Will set you up for a sleep study, and will arrange followup once the results are available.   

## 2013-06-15 NOTE — Assessment & Plan Note (Signed)
The patient's anatomy and clinical history is very suggestive of significant sleep apnea.  l have had a long discussion with her about the pathophysiology of sleep apnea, including its impact to her quality of life and cardiovascular health.  The patient is very hesitant to have a sleep study, but is willing to do this for her daughter.  She is a poor candidate for sleep testing, but I'm willing to do this first her convenience.  However, she understands if we do not get clean data, she would need to go to the sleep center for formal NP SG.  The patient  prefers to do her study initially at the sleep Center, and we will arrange this.

## 2013-06-21 ENCOUNTER — Telehealth: Payer: Self-pay

## 2013-06-21 NOTE — Telephone Encounter (Signed)
Phone number provided has been disconnected.  Left detailed message on voicemail of patient's daughter, Manley Mason.

## 2013-06-21 NOTE — Telephone Encounter (Signed)
We have a pen to give her.  A pen should last 4 days and that should let them get the regular rx on Friday.  Thanks.
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
## 2013-06-21 NOTE — Telephone Encounter (Signed)
Yvette Gutierrez pts daughter was in [place] and [facility]; Runell Gess wants to know if any samples of Lantus availble; pts son will go [facility]. Pt has been out of insulin for 2 days.Please advise.

## 2013-06-21 NOTE — Telephone Encounter (Signed)
Left message on voice mail  to call back

## 2013-06-30 ENCOUNTER — Other Ambulatory Visit: Payer: Self-pay | Admitting: *Deleted

## 2013-06-30 MED ORDER — FUROSEMIDE 20 MG PO TABS: 20.0000 mg | ORAL_TABLET | Freq: Every day | ORAL | Status: DC

## 2013-07-11 ENCOUNTER — Encounter: Payer: Self-pay | Admitting: Radiology

## 2013-07-12 ENCOUNTER — Ambulatory Visit: Payer: Medicare Other | Admitting: Family Medicine

## 2013-07-15 ENCOUNTER — Encounter (HOSPITAL_BASED_OUTPATIENT_CLINIC_OR_DEPARTMENT_OTHER): Payer: Medicare Other

## 2013-10-25 ENCOUNTER — Ambulatory Visit (INDEPENDENT_AMBULATORY_CARE_PROVIDER_SITE_OTHER): Payer: Medicare Other | Admitting: Family Medicine

## 2013-10-25 ENCOUNTER — Encounter: Payer: Self-pay | Admitting: Family Medicine

## 2013-10-25 VITALS — BP 138/80 | HR 72 | Temp 98.1°F | Wt 210.5 lb

## 2013-10-25 DIAGNOSIS — E785 Hyperlipidemia, unspecified: Secondary | ICD-10-CM

## 2013-10-25 DIAGNOSIS — I10 Essential (primary) hypertension: Secondary | ICD-10-CM

## 2013-10-25 DIAGNOSIS — E119 Type 2 diabetes mellitus without complications: Secondary | ICD-10-CM

## 2013-10-25 LAB — LIPID PANEL
Cholesterol: 99 mg/dL (ref 0–200)
HDL: 35 mg/dL — ABNORMAL LOW (ref >39.00)
LDL CALC: 35 mg/dL (ref 0–99)
Total CHOL/HDL Ratio: 3
Triglycerides: 147 mg/dL (ref 0.0–149.0)
VLDL: 29.4 mg/dL (ref 0.0–40.0)

## 2013-10-25 LAB — BASIC METABOLIC PANEL
BUN: 17 mg/dL (ref 6–23)
CALCIUM: 9.3 mg/dL (ref 8.4–10.5)
CO2: 27 mEq/L (ref 19–32)
CREATININE: 0.7 mg/dL (ref 0.4–1.2)
Chloride: 106 mEq/L (ref 96–112)
GFR: 86.93 mL/min (ref >60.00)
Glucose, Bld: 91 mg/dL (ref 70–99)
Potassium: 4.3 mEq/L (ref 3.5–5.1)
SODIUM: 141 meq/L (ref 135–145)

## 2013-10-25 LAB — HEMOGLOBIN A1C: HEMOGLOBIN A1C: 8.6 % — AB (ref 4.6–6.5)

## 2013-10-25 NOTE — Patient Instructions (Signed)
Schedule eye doctor appointment if you're due. Check blood pressure at home and let me know if consistently >140/90 for change in blood pressure regimen. Blood work today.

## 2013-10-25 NOTE — Assessment & Plan Note (Signed)
Continue simvastatin. Chronic, stable.

## 2013-10-25 NOTE — Progress Notes (Signed)
Subjective:    Patient ID: Yvette Gutierrez, female    DOB: 10/29/1945, 68 y.o.   MRN: [id number]  HPI CC: DM f/u  Presents with daughter today.  Dr Lianne Bushy patient presents today for diabetes follow up.  husband passed away May 09, 2013.  Now daughter helps her her with med administration.  Nursing aide comes every morning to help with meds.  DM - regularly does not check sugars: maybe a few times monthly, endorses fasting readings <110.  Unsure if compliant with antihyperglycemic regimen which includes: lantus 74 units daily, metformin 1000mg  with breakfast and 1/2 actos 45 mg daily.  Denies blood in urine.  Denies low sugars or hypoglycemic symptoms.  Denies paresthesias. Last diabetic eye exam summer 2014, sees twice yearly.  Pneumovax: 2012.    HTN - Compliant with current antihypertensive regimen of coreg 6.25mg  bid, procardia xl 60mg  daily, lisinopril 40mg  daily and lasix 20mg  daily.  Does not check blood pressures at home.  No low blood pressure readings or symptoms of dizziness/syncope.  Denies HA, vision changes, CP/tightness, SOB, leg swelling.   BP Readings from Last 3 Encounters:  10/25/13 158/78  06/15/13 108/86  05/04/13 138/88    HLD - compliant with simvastatin 20mg  daily - denies myalgias.  Past Medical History  Diagnosis Date  . Allergic rhinitis   . Depression   . Diabetes mellitus type II   . Neuropathy due to secondary diabetes   . HTN (hypertension)   . Obesity   . CVA (cerebral infarction) 2002    Chronic unsteadiness  . Bilateral shoulder pain     chronic-uses vicodin a few times a month for pain control     Review of Systems Per HPI    Objective:   Physical Exam  Nursing note and vitals reviewed. Constitutional: She appears well-developed and well-nourished. No distress.  HENT:  Head: Normocephalic and atraumatic.  Mouth/Throat: Oropharynx is clear and moist. No oropharyngeal exudate.  Eyes: Conjunctivae and EOM are normal. Pupils are equal, round,  and reactive to light. No scleral icterus.  Neck: Normal range of motion. Neck supple. Carotid bruit is not present.  Cardiovascular: Normal rate, regular rhythm, normal heart sounds and intact distal pulses.   No murmur heard. Pulmonary/Chest: Effort normal and breath sounds normal. No respiratory distress. She has no wheezes. She has no rales.  Musculoskeletal: She exhibits edema (tr pedal edema).  Diabetic foot exam: Normal inspection No skin breakdown No calluses  Normal DP/PT pulses Normal sensation to light tough and monofilament Nails normal  Lymphadenopathy:    She has no cervical adenopathy.  Skin: Skin is warm and dry. No rash noted.  Psychiatric: She has a normal mood and affect.       Assessment & Plan:

## 2013-10-25 NOTE — Progress Notes (Signed)
Pre-visit discussion using our clinic review tool. No additional management support is needed unless otherwise documented below in the visit note.  

## 2013-10-25 NOTE — Assessment & Plan Note (Signed)
bp on recheck better controlled. Continue medicines as up to now. Pt will let us know if bp starts running high - has a way to check at home.

## 2013-10-25 NOTE — Assessment & Plan Note (Addendum)
Chronic, seems stable per pt report but unsure how compliant pt has been with recent move. Recheck A1c today. Continue current regimen.

## 2013-10-26 ENCOUNTER — Telehealth: Payer: Self-pay | Admitting: Family Medicine

## 2013-10-26 ENCOUNTER — Telehealth: Payer: Self-pay

## 2013-10-26 NOTE — Telephone Encounter (Signed)
Relevant patient education assigned to patient using Emmi. ° °

## 2013-11-06 ENCOUNTER — Encounter: Payer: Self-pay | Admitting: Family Medicine

## 2013-11-06 NOTE — Progress Notes (Signed)
Pt's daughter came in.  Update on patient- sugars elevated, ~250 or higher daily.  D/w daughter about DM2 diet for her mother.  Would inc lantus by 1 unit per day until sugar is 100-150 in AM.  She agrees.

## 2014-01-12 ENCOUNTER — Other Ambulatory Visit: Payer: Self-pay

## 2014-01-12 MED ORDER — METFORMIN HCL 1000 MG PO TABS: 1000.0000 mg | ORAL_TABLET | Freq: Every day | ORAL | Status: DC

## 2014-01-12 MED ORDER — HYDROCODONE-ACETAMINOPHEN 5-325 MG PO TABS: 1.0000 | ORAL_TABLET | Freq: Three times a day (TID) | ORAL | Status: DC | PRN

## 2014-01-12 MED ORDER — CARVEDILOL 6.25 MG PO TABS: 6.2500 mg | ORAL_TABLET | Freq: Two times a day (BID) | ORAL | Status: DC

## 2014-01-12 MED ORDER — ASPIRIN-DIPYRIDAMOLE ER 25-200 MG PO CP12: 1.0000 | ORAL_CAPSULE | Freq: Two times a day (BID) | ORAL | Status: DC

## 2014-01-12 MED ORDER — LISINOPRIL 40 MG PO TABS: 40.0000 mg | ORAL_TABLET | Freq: Every day | ORAL | Status: DC

## 2014-01-12 MED ORDER — AMITRIPTYLINE HCL 25 MG PO TABS: 25.0000 mg | ORAL_TABLET | Freq: Every day | ORAL | Status: DC

## 2014-01-12 MED ORDER — SIMVASTATIN 20 MG PO TABS: 20.0000 mg | ORAL_TABLET | Freq: Every day | ORAL | Status: DC

## 2014-01-12 MED ORDER — INSULIN GLARGINE 100 UNIT/ML SOLOSTAR PEN: 74.0000 [IU] | PEN_INJECTOR | Freq: Every day | SUBCUTANEOUS | Status: DC

## 2014-01-12 MED ORDER — FUROSEMIDE 20 MG PO TABS: 20.0000 mg | ORAL_TABLET | Freq: Every day | ORAL | Status: DC

## 2014-01-12 MED ORDER — PIOGLITAZONE HCL 45 MG PO TABS: 22.5000 mg | ORAL_TABLET | Freq: Every day | ORAL | Status: DC

## 2014-01-12 NOTE — Telephone Encounter (Signed)
Rosey [name] advised.  Rx left at front desk for pick up.

## 2014-01-12 NOTE — Telephone Encounter (Signed)
Yvette Gutierrez left v/m requesting printed rx for amitryptyline,,coreg,aggrenox,vicodin,lantus,lisinopril,metformin, actos and simvastatin. [facility] med at [facility]. Yvette Gutierrez [name] request cb when ready for pick up.

## 2014-01-12 NOTE — Telephone Encounter (Signed)
Printed.  Thanks.  

## 2014-01-17 ENCOUNTER — Other Ambulatory Visit: Payer: Self-pay | Admitting: *Deleted

## 2014-01-17 MED ORDER — VENLAFAXINE HCL ER 150 MG PO CP24: 150.0000 mg | ORAL_CAPSULE | Freq: Every day | ORAL | Status: DC

## 2014-01-17 MED ORDER — INSULIN PEN NEEDLE 31G X 5 MM MISC: Status: DC

## 2014-01-17 MED ORDER — NIFEDIPINE ER 60 MG PO TB24: 60.0000 mg | ORAL_TABLET | Freq: Every day | ORAL | Status: DC

## 2014-01-17 NOTE — Telephone Encounter (Signed)
Patient's son came by to pick up written Rx's to fill [facility] and says they are missing an Rx for Nifedipine, Effexor and pen needles.  Printed Rx's to ask Dr. Reece AgarG to sign.

## 2014-01-17 NOTE — Telephone Encounter (Signed)
Signed off on meds

## 2014-01-26 ENCOUNTER — Emergency Department: Payer: Self-pay | Admitting: Emergency Medicine

## 2014-01-26 LAB — URINALYSIS, COMPLETE
BILIRUBIN, UR: NEGATIVE
Blood: NEGATIVE
Glucose,UR: >=500 mg/dL (ref 0–75)
Nitrite: NEGATIVE
PH: 5 (ref 4.5–8.0)
Protein: NEGATIVE
RBC,UR: 3 /HPF (ref 0–5)
Specific Gravity: 1.01 (ref 1.003–1.030)
Squamous Epithelial: 2
WBC UR: 21 /HPF (ref 0–5)

## 2014-01-26 LAB — CBC
HCT: 37.9 % (ref 35.0–47.0)
HGB: 12 g/dL (ref 12.0–16.0)
MCH: 27.9 pg (ref 26.0–34.0)
MCHC: 31.7 g/dL — ABNORMAL LOW (ref 32.0–36.0)
MCV: 88 fL (ref 80–100)
Platelet: 145 10*3/uL — ABNORMAL LOW (ref 150–440)
RBC: 4.3 10*6/uL (ref 3.80–5.20)
RDW: 14.4 % (ref 11.5–14.5)
WBC: 8.1 10*3/uL (ref 3.6–11.0)

## 2014-01-26 LAB — COMPREHENSIVE METABOLIC PANEL
ALK PHOS: 89 U/L
ALT: 17 U/L (ref 12–78)
ANION GAP: 6 — AB (ref 7–16)
AST: 24 U/L (ref 15–37)
Albumin: 3.2 g/dL — ABNORMAL LOW (ref 3.4–5.0)
BILIRUBIN TOTAL: 0.3 mg/dL (ref 0.2–1.0)
BUN: 33 mg/dL — ABNORMAL HIGH (ref 7–18)
CALCIUM: 8.7 mg/dL (ref 8.5–10.1)
CHLORIDE: 103 mmol/L (ref 98–107)
CO2: 24 mmol/L (ref 21–32)
Creatinine: 0.9 mg/dL (ref 0.60–1.30)
EGFR (African American): >60
EGFR (Non-African Amer.): >60
GLUCOSE: 373 mg/dL — AB (ref 65–99)
Osmolality: 289 (ref 275–301)
Potassium: 4.4 mmol/L (ref 3.5–5.1)
SODIUM: 133 mmol/L — AB (ref 136–145)
Total Protein: 6.5 g/dL (ref 6.4–8.2)

## 2014-01-28 LAB — URINE CULTURE

## 2014-02-02 ENCOUNTER — Encounter: Payer: Self-pay | Admitting: Family Medicine

## 2014-02-02 ENCOUNTER — Encounter: Payer: Self-pay | Admitting: *Deleted

## 2014-02-02 ENCOUNTER — Ambulatory Visit (INDEPENDENT_AMBULATORY_CARE_PROVIDER_SITE_OTHER): Payer: Medicare Other | Admitting: Family Medicine

## 2014-02-02 VITALS — BP 136/82 | HR 76 | Temp 97.9°F | Wt 200.1 lb

## 2014-02-02 DIAGNOSIS — E119 Type 2 diabetes mellitus without complications: Secondary | ICD-10-CM

## 2014-02-02 DIAGNOSIS — Z634 Disappearance and death of family member: Secondary | ICD-10-CM

## 2014-02-02 NOTE — Progress Notes (Signed)
Pre visit review using our clinic review tool, if applicable. No additional management support is needed unless otherwise documented below in the visit note.  Diabetes:  Using medications without difficulties: may have missed lantus doses prev.  Hypoglycemic episodes:no but occ <100 Hyperglycemic episodes:yes, up to 400 Blood Sugars averaging: see above.  Meals are variable.  D/w pt.  Unclear how much she is going to dining room at facility.  D/w pt.   Modest weight loss noted, likely due to dec in intake.    D/w pt about loss of husband, relocation to facility.  She sounds to be isolated, partly due to her preference.    Meds, vitals, and allergies reviewed.   ROS: See HPI.  Otherwise negative.    GEN: nad, alert and oriented HEENT: mucous membranes moist NECK: supple w/o LA CV: rrr. PULM: ctab, no inc wob ABD: soft, +bs Dec shoulder rom B

## 2014-02-02 NOTE — Patient Instructions (Signed)
We'll contact you with your lab report. Don't change your meds for now.  Goals for the near future:  1. Get to the dining room for meals.  2. [facility].  3. Consider counseling with Hospice.   Take care. Glad to see you.

## 2014-02-02 NOTE — Assessment & Plan Note (Signed)
She may benefit from more activities at facility and she'll work on this.  She agrees to at least try.  See instructions.  >25 minutes spent in face to face time with patient, >50% spent in counselling or coordination of care.

## 2014-02-02 NOTE — Assessment & Plan Note (Signed)
Discussed regular intake. No changes in meds.  See notes on labs.  Pt agrees.

## 2014-02-03 LAB — COMPREHENSIVE METABOLIC PANEL
ALK PHOS: 72 U/L (ref 39–117)
ALT: 13 U/L (ref 0–35)
AST: 18 U/L (ref 0–37)
Albumin: 4.1 g/dL (ref 3.5–5.2)
BILIRUBIN TOTAL: 0.4 mg/dL (ref 0.3–1.2)
BUN: 18 mg/dL (ref 6–23)
CHLORIDE: 105 meq/L (ref 96–112)
CO2: 22 mEq/L (ref 19–32)
CREATININE: 0.8 mg/dL (ref 0.4–1.2)
Calcium: 9.3 mg/dL (ref 8.4–10.5)
GFR: 72.53 mL/min (ref >60.00)
Glucose, Bld: 92 mg/dL (ref 70–99)
Potassium: 4.2 mEq/L (ref 3.5–5.1)
Sodium: 139 mEq/L (ref 135–145)
Total Protein: 7.4 g/dL (ref 6.0–8.3)

## 2014-02-03 LAB — TSH: TSH: 2.56 u[IU]/mL (ref 0.35–5.50)

## 2014-02-03 LAB — HEMOGLOBIN A1C: Hgb A1c MFr Bld: 9.7 % — ABNORMAL HIGH (ref 4.6–6.5)

## 2014-03-01 ENCOUNTER — Ambulatory Visit: Payer: Medicare Other | Admitting: Family Medicine

## 2014-04-30 ENCOUNTER — Other Ambulatory Visit: Payer: Self-pay | Admitting: Family Medicine

## 2014-04-30 DIAGNOSIS — E119 Type 2 diabetes mellitus without complications: Secondary | ICD-10-CM

## 2014-05-02 ENCOUNTER — Other Ambulatory Visit (INDEPENDENT_AMBULATORY_CARE_PROVIDER_SITE_OTHER): Payer: Medicare Other

## 2014-05-02 DIAGNOSIS — E119 Type 2 diabetes mellitus without complications: Secondary | ICD-10-CM

## 2014-05-02 LAB — HEMOGLOBIN A1C: Hgb A1c MFr Bld: 8.8 % — ABNORMAL HIGH (ref 4.6–6.5)

## 2014-05-08 ENCOUNTER — Other Ambulatory Visit: Payer: Self-pay

## 2014-05-08 MED ORDER — HYDROCODONE-ACETAMINOPHEN 5-325 MG PO TABS: 1.0000 | ORAL_TABLET | Freq: Three times a day (TID) | ORAL | Status: DC | PRN

## 2014-05-08 MED ORDER — LORATADINE 10 MG PO TABS: 10.0000 mg | ORAL_TABLET | Freq: Every day | ORAL | Status: DC

## 2014-05-08 NOTE — Telephone Encounter (Signed)
Renae Fickleaul signed for prescriptions and was advised to tell Mrs Autumn MessingHowe hello. If Renae Fickleaul has further questions he will contact office. Appreciative for rx.

## 2014-05-08 NOTE — Telephone Encounter (Signed)
pts son Kerrie Pleasureaul Wegmann said pts daughter was helping with medications and now she is not. Renae FicklePaul request # of meds pt is presently taking; current med list printed and given to ZebulonPaul. Pt is also out of hydrocodone apap and pt plans to [facility] to get pts other meds and pt request written rx for hydrocodone and claritin now. Advised of refill policy and Renae Fickleaul voiced understanding but he does not want to make the trip to [facility] pharmacy twice.Please advise.

## 2014-05-08 NOTE — Telephone Encounter (Signed)
Printed, thanks.  Please pass along to the patient that I said hello.

## 2014-06-01 ENCOUNTER — Ambulatory Visit: Payer: Medicare Other | Admitting: Family Medicine

## 2014-06-29 ENCOUNTER — Ambulatory Visit (INDEPENDENT_AMBULATORY_CARE_PROVIDER_SITE_OTHER): Payer: Medicare Other | Admitting: Family Medicine

## 2014-06-29 ENCOUNTER — Encounter: Payer: Self-pay | Admitting: Family Medicine

## 2014-06-29 VITALS — BP 114/74 | HR 84 | Temp 97.8°F | Wt 198.2 lb

## 2014-06-29 DIAGNOSIS — Z9181 History of falling: Secondary | ICD-10-CM

## 2014-06-29 NOTE — Progress Notes (Signed)
Pre visit review using our clinic review tool, if applicable. No additional management support is needed unless otherwise documented below in the visit note.  L lens out of glasses. She has dropped them.  She had an episode of trouble getting out of her chair.  She has fallen prev.  She has an alarm button.  Living at [facility].   She has been more withdrawn from social activities recently.  She didn't want to go to the cafeteria. She had a friend die and "I didn't want to hear about the other's families."  Diet is variable in the meantime.    She is looking at options for placement with family, with financial considerations.   Detailed conversation today about her abilities and needs.  See plan.  We talked about grief at loss of husband.  She is trying to work through this.    Meds, vitals, and allergies reviewed.   ROS: See HPI.  Otherwise, noncontributory.  nad Ambulatory, limited shoulder ROM Speech wnl, memory intact. Remainder of exam deferred.

## 2014-06-29 NOTE — Patient Instructions (Signed)
I'll check on a list of facilities.  Please check with social work in the meantime. Take care.  We'll go from there.

## 2014-06-30 ENCOUNTER — Encounter: Payer: Self-pay | Admitting: Family Medicine

## 2014-06-30 DIAGNOSIS — Z9181 History of falling: Secondary | ICD-10-CM | POA: Insufficient documentation

## 2014-06-30 NOTE — Assessment & Plan Note (Signed)
D/w pt and family.  Needs help with med organization, delivery.  Sleeping in a recliner, needs help in and out of chair.  Known shoulder pain and dec in ROM.  Can feed self but can't cook.  Needs some help toileting. Can walk flat ground, can't do stairs, doesn't drive. Needs help with laundry.  Family is important to her, wants to stay local.  Memory is sharp.  Likely would need skilled care.  Family to check with SW re: financial questions.  I'll get a list of local facilities and will have family check on those.  I can do FL2 in the future.  Doesn't need hospitalization now.  >25 minutes spent in face to face time with patient, >50% spent in counselling or coordination of care.

## 2014-07-02 ENCOUNTER — Telehealth: Payer: Self-pay | Admitting: Family Medicine

## 2014-07-02 NOTE — Telephone Encounter (Signed)
Notify pt/family.  I asked around about SNFs.  I would check on [address], [facility], [facility], and [facility].  I don't have preferences. I would have them check those places first and see what she/they think.  Thanks.

## 2014-07-03 NOTE — Telephone Encounter (Signed)
Left message on voice mail  to call back

## 2014-07-04 NOTE — Telephone Encounter (Signed)
Tom pts son left v/m requesting cb for pts placement at a facility.

## 2014-07-05 NOTE — Telephone Encounter (Signed)
Patient's son Elijah Birk notified as instructed by telephone.

## 2014-07-20 ENCOUNTER — Telehealth: Payer: Self-pay | Admitting: Family Medicine

## 2014-07-20 NOTE — Telephone Encounter (Signed)
Pt's son, Elijah Birkom called. He says at last apptmt for pt you suggested skilled nursing home. Pt's insurance will not cover w/out a hospital stay.  However, he was advised that if it's a rehab type reason then there are avenues they can work around. He says both of his Mom's shoulders are separated so he doesn't know if that will count as rehab or not.  They told him pt's PCP will be the one to determine if it's rehab for not. Mr. Autumn MessingHowe says if they need an apptmt he will make one but didn't know if it is necessary or not. Pt requests c/b. Thank you.

## 2014-07-20 NOTE — Telephone Encounter (Signed)
I would be okay with trial of rehab for decondition and shoulder pain/limited ROM.  Wouldn't need OV here from my standpoint.  Have them notify us about the FL2 need and the specific facility and we can go from there.  Thanks.

## 2014-07-24 NOTE — Telephone Encounter (Signed)
Left message on voice mail  to call back

## 2014-07-25 NOTE — Telephone Encounter (Signed)
Left message on voice mail  to call back

## 2014-07-26 NOTE — Telephone Encounter (Signed)
Patient's son notified as instructed by telephone. Was advised by Elijah Birkom that they are having a hard time finding a facility that will take patient without her coming from the hospital. Elijah Birkom is going to continue to check around and will call back with the facility name if he is able to work something out.

## 2014-08-22 ENCOUNTER — Telehealth: Payer: Self-pay | Admitting: Family Medicine

## 2014-08-22 NOTE — Telephone Encounter (Signed)
error 

## 2014-08-23 ENCOUNTER — Telehealth: Payer: Self-pay | Admitting: Family Medicine

## 2014-08-23 NOTE — Telephone Encounter (Signed)
Form is on your desk.

## 2014-08-23 NOTE — Telephone Encounter (Signed)
Clydie BraunKaren dropped of FL2 for to be filled out by Dr Para Marchuncan. Will put inbox Thank you

## 2014-08-24 NOTE — Telephone Encounter (Signed)
Form done, please attach a med list (I can't fill them on the form).

## 2014-08-24 NOTE — Telephone Encounter (Signed)
Yvette BallRobin notified son form ready, med list attached and copy sent to scanning

## 2014-08-28 ENCOUNTER — Observation Stay: Payer: Self-pay | Admitting: Internal Medicine

## 2014-08-28 LAB — DRUG SCREEN, URINE
Amphetamines, Ur Screen: NEGATIVE (ref ?–1000)
Barbiturates, Ur Screen: NEGATIVE (ref ?–200)
Benzodiazepine, Ur Scrn: NEGATIVE (ref ?–200)
CANNABINOID 50 NG, UR ~~LOC~~: NEGATIVE (ref ?–50)
Cocaine Metabolite,Ur ~~LOC~~: NEGATIVE (ref ?–300)
MDMA (ECSTASY) UR SCREEN: NEGATIVE (ref ?–500)
Methadone, Ur Screen: NEGATIVE (ref ?–300)
OPIATE, UR SCREEN: NEGATIVE (ref ?–300)
Phencyclidine (PCP) Ur S: NEGATIVE (ref ?–25)
Tricyclic, Ur Screen: NEGATIVE (ref ?–1000)

## 2014-08-28 LAB — URINALYSIS, COMPLETE
Bilirubin,UR: NEGATIVE
Hyaline Cast: 3
LEUKOCYTE ESTERASE: NEGATIVE
NITRITE: NEGATIVE
PH: 5 (ref 4.5–8.0)
SPECIFIC GRAVITY: 1.016 (ref 1.003–1.030)
Squamous Epithelial: NONE SEEN
WBC UR: <1 /HPF (ref 0–5)

## 2014-08-28 LAB — CBC
HCT: 44.2 % (ref 35.0–47.0)
HGB: 14.2 g/dL (ref 12.0–16.0)
MCH: 30.2 pg (ref 26.0–34.0)
MCHC: 32.1 g/dL (ref 32.0–36.0)
MCV: 94 fL (ref 80–100)
Platelet: 193 10*3/uL (ref 150–440)
RBC: 4.7 10*6/uL (ref 3.80–5.20)
RDW: 13.4 % (ref 11.5–14.5)
WBC: 12.2 10*3/uL — ABNORMAL HIGH (ref 3.6–11.0)

## 2014-08-28 LAB — COMPREHENSIVE METABOLIC PANEL
AST: 20 U/L (ref 15–37)
Albumin: 4 g/dL (ref 3.4–5.0)
Alkaline Phosphatase: 111 U/L
Anion Gap: 16 (ref 7–16)
BILIRUBIN TOTAL: 0.5 mg/dL (ref 0.2–1.0)
BUN: 14 mg/dL (ref 7–18)
CALCIUM: 9.3 mg/dL (ref 8.5–10.1)
CHLORIDE: 106 mmol/L (ref 98–107)
Co2: 19 mmol/L — ABNORMAL LOW (ref 21–32)
Creatinine: 0.92 mg/dL (ref 0.60–1.30)
EGFR (African American): >60
EGFR (Non-African Amer.): >60
Glucose: 378 mg/dL — ABNORMAL HIGH (ref 65–99)
Osmolality: 297 (ref 275–301)
POTASSIUM: 4.2 mmol/L (ref 3.5–5.1)
SGPT (ALT): 20 U/L
Sodium: 141 mmol/L (ref 136–145)
TOTAL PROTEIN: 8.3 g/dL — AB (ref 6.4–8.2)

## 2014-08-28 LAB — TROPONIN I: Troponin-I: <0.02 ng/mL

## 2014-08-28 LAB — PROTIME-INR
INR: 1.1
PROTHROMBIN TIME: 13.6 s (ref 11.5–14.7)

## 2014-08-28 LAB — HEMOGLOBIN A1C: Hemoglobin A1C: 9.6 % — ABNORMAL HIGH (ref 4.2–6.3)

## 2014-08-28 LAB — CK TOTAL AND CKMB (NOT AT ARMC)
CK, Total: 74 U/L
CK-MB: 2.7 ng/mL (ref 0.5–3.6)

## 2014-08-28 LAB — TSH: THYROID STIMULATING HORM: 1.2 u[IU]/mL

## 2014-08-29 LAB — CBC WITH DIFFERENTIAL/PLATELET
Basophil #: 0 10*3/uL (ref 0.0–0.1)
Basophil %: 0.3 %
EOS ABS: 0 10*3/uL (ref 0.0–0.7)
EOS PCT: 0.3 %
HCT: 36.1 % (ref 35.0–47.0)
HGB: 11.6 g/dL — AB (ref 12.0–16.0)
LYMPHS ABS: 1.8 10*3/uL (ref 1.0–3.6)
LYMPHS PCT: 19.1 %
MCH: 30.1 pg (ref 26.0–34.0)
MCHC: 32.1 g/dL (ref 32.0–36.0)
MCV: 94 fL (ref 80–100)
Monocyte #: 1 x10 3/mm — ABNORMAL HIGH (ref 0.2–0.9)
Monocyte %: 10.2 %
NEUTROS PCT: 70.1 %
Neutrophil #: 6.7 10*3/uL — ABNORMAL HIGH (ref 1.4–6.5)
Platelet: 148 10*3/uL — ABNORMAL LOW (ref 150–440)
RBC: 3.85 10*6/uL (ref 3.80–5.20)
RDW: 13.5 % (ref 11.5–14.5)
WBC: 9.6 10*3/uL (ref 3.6–11.0)

## 2014-08-29 LAB — LIPID PANEL
Cholesterol: 110 mg/dL (ref 0–200)
HDL: 38 mg/dL — AB (ref 40–60)
Ldl Cholesterol, Calc: 29 mg/dL (ref 0–100)
TRIGLYCERIDES: 215 mg/dL — AB (ref 0–200)
VLDL Cholesterol, Calc: 43 mg/dL — ABNORMAL HIGH (ref 5–40)

## 2014-08-29 LAB — BASIC METABOLIC PANEL
ANION GAP: 9 (ref 7–16)
BUN: 10 mg/dL (ref 7–18)
CALCIUM: 8.1 mg/dL — AB (ref 8.5–10.1)
CO2: 25 mmol/L (ref 21–32)
Chloride: 107 mmol/L (ref 98–107)
Creatinine: 0.77 mg/dL (ref 0.60–1.30)
EGFR (African American): >60
EGFR (Non-African Amer.): >60
GLUCOSE: 214 mg/dL — AB (ref 65–99)
Osmolality: 287 (ref 275–301)
Potassium: 3.8 mmol/L (ref 3.5–5.1)
Sodium: 141 mmol/L (ref 136–145)

## 2014-08-29 LAB — SEDIMENTATION RATE: Erythrocyte Sed Rate: 52 mm/hr — ABNORMAL HIGH (ref 0–30)

## 2014-08-29 LAB — FOLATE: Folic Acid: 31.3 ng/mL (ref 3.1–100.0)

## 2014-09-11 ENCOUNTER — Telehealth: Payer: Self-pay | Admitting: Family Medicine

## 2014-09-11 NOTE — Telephone Encounter (Signed)
Placed in Dr. Duncan's In Box. 

## 2014-09-11 NOTE — Telephone Encounter (Signed)
I'll work on the hard copy.  Thanks.  

## 2014-09-11 NOTE — Telephone Encounter (Signed)
Daughter in law dropped of dept of veterns form to be filled out On lugene's desk

## 2014-09-12 DIAGNOSIS — S7292XA Unspecified fracture of left femur, initial encounter for closed fracture: Secondary | ICD-10-CM

## 2014-09-12 HISTORY — DX: Unspecified fracture of left femur, initial encounter for closed fracture: S72.92XA

## 2014-09-19 NOTE — Telephone Encounter (Signed)
Pt daughter in law came to office to check on status of paperwork. Please call her when complete.  Butch PennyKami Macho # [phone]

## 2014-09-22 ENCOUNTER — Emergency Department: Payer: Self-pay | Admitting: Student

## 2014-10-07 ENCOUNTER — Inpatient Hospital Stay: Payer: Self-pay | Admitting: Internal Medicine

## 2014-10-07 LAB — URINALYSIS, COMPLETE
BILIRUBIN, UR: NEGATIVE
BLOOD: NEGATIVE
Glucose,UR: NEGATIVE mg/dL (ref 0–75)
Ketone: NEGATIVE
Leukocyte Esterase: NEGATIVE
Nitrite: NEGATIVE
Ph: 5 (ref 4.5–8.0)
Protein: NEGATIVE
RBC,UR: 1 /HPF (ref 0–5)
Specific Gravity: 1.018 (ref 1.003–1.030)
Squamous Epithelial: <1

## 2014-10-07 LAB — CBC WITH DIFFERENTIAL/PLATELET
Basophil #: 0 10*3/uL (ref 0.0–0.1)
Basophil %: 0.3 %
EOS ABS: 0.4 10*3/uL (ref 0.0–0.7)
Eosinophil %: 5.5 %
HCT: 37.9 % (ref 35.0–47.0)
HGB: 12.3 g/dL (ref 12.0–16.0)
Lymphocyte #: 1.8 10*3/uL (ref 1.0–3.6)
Lymphocyte %: 21.7 %
MCH: 30.2 pg (ref 26.0–34.0)
MCHC: 32.4 g/dL (ref 32.0–36.0)
MCV: 93 fL (ref 80–100)
MONOS PCT: 6.5 %
Monocyte #: 0.5 x10 3/mm (ref 0.2–0.9)
NEUTROS ABS: 5.4 10*3/uL (ref 1.4–6.5)
Neutrophil %: 66 %
PLATELETS: 178 10*3/uL (ref 150–440)
RBC: 4.07 10*6/uL (ref 3.80–5.20)
RDW: 13.7 % (ref 11.5–14.5)
WBC: 8.2 10*3/uL (ref 3.6–11.0)

## 2014-10-07 LAB — MAGNESIUM: MAGNESIUM: 2 mg/dL

## 2014-10-07 LAB — TROPONIN I: Troponin-I: <0.02 ng/mL

## 2014-10-07 LAB — COMPREHENSIVE METABOLIC PANEL
ALBUMIN: 3.8 g/dL (ref 3.4–5.0)
Alkaline Phosphatase: 76 U/L
Anion Gap: 6 — ABNORMAL LOW (ref 7–16)
BILIRUBIN TOTAL: 0.2 mg/dL (ref 0.2–1.0)
BUN: 26 mg/dL — ABNORMAL HIGH (ref 7–18)
CO2: 27 mmol/L (ref 21–32)
Calcium, Total: 9.1 mg/dL (ref 8.5–10.1)
Chloride: 105 mmol/L (ref 98–107)
Creatinine: 1.2 mg/dL (ref 0.60–1.30)
GFR CALC AF AMER: 57 — AB
GFR CALC NON AF AMER: 47 — AB
Glucose: 166 mg/dL — ABNORMAL HIGH (ref 65–99)
OSMOLALITY: 284 (ref 275–301)
POTASSIUM: 4.1 mmol/L (ref 3.5–5.1)
SGOT(AST): 13 U/L — ABNORMAL LOW (ref 15–37)
SGPT (ALT): 19 U/L
Sodium: 138 mmol/L (ref 136–145)
Total Protein: 7.5 g/dL (ref 6.4–8.2)

## 2014-10-07 LAB — APTT: Activated PTT: 29.3 secs (ref 23.6–35.9)

## 2014-10-07 LAB — LIPASE, BLOOD: Lipase: 278 U/L (ref 73–393)

## 2014-10-07 LAB — PROTIME-INR
INR: 1
Prothrombin Time: 13.1 secs (ref 11.5–14.7)

## 2014-10-15 ENCOUNTER — Encounter: Payer: Self-pay | Admitting: Family Medicine

## 2015-02-03 NOTE — Consult Note (Signed)
Brief Consult Note: Diagnosis: left supracondylar femur fracture.   Patient was seen by consultant.   Comments: This note serves as the full consult note ---- 69 year old SNF resident with diabetic peripheral neuropathy, who is a minimal ambulator, now status post fall with a left supracondylar femur fracture. I long discussion with her and her family. Regardless of surgical versus nonsurgical treatment, she has a very low likelihood of returning to being an ambulator. she is very high risk for surgical complications due to her diabetic neuropathy. Furthermore, the patient has no desire to return to ambulation and very much wants to avoid surgery. Recommend treatment with a knee immobilizer with assisted transfers, nonweight bearing to her left lower extremity, and [facility].  30 min. were spent on this consultation, all of which was  face-to-face time, counseling, discussing the diagnosis and treatment choices.  Ruthe MannanPeter Jojo Pehl MD PhD.  Electronic Signatures: Ruthe MannanApel, Levi Klaiber (MD)  (Signed 26-Dec-15 00:49)  Authored: Brief Consult Note   Last Updated: 26-Dec-15 00:49 by Ruthe MannanApel, Lenea Bywater (MD)

## 2015-02-03 NOTE — Consult Note (Signed)
PATIENT NAME:  Yvette Gutierrez, [name] MR#:  [id number] DATE OF BIRTH:  08-04-1946  DATE OF CONSULTATION:  08/31/2014  REFERRING PHYSICIAN:   CONSULTING PHYSICIAN:  Sabreen Kitchen P. Juliene PinaMody, MD  ADDENDUM:    The patient is actually being discharged 08/31/2014. She has a bed at a rehabilitation center today. No new changes from her recent discharge summary.    ____________________________ Janyth ContesSital P. Juliene PinaMody, MD spm:at D: 08/31/2014 09:59:06 ET T: 08/31/2014 10:16:23 ET JOB#: [id number]  cc: Jyles Sontag P. Juliene PinaMody, MD, <Dictator> Janyth ContesSITAL P Richell Corker MD ELECTRONICALLY SIGNED 08/31/2014 13:29

## 2015-02-03 NOTE — Consult Note (Signed)
Referring Physician:  Aldean Jewett :   Primary Care Physician:  Aldean Jewett : [place], [address], [place], [place] [address], [place] [phone]  Reason for Consult: Admit Date: 29-Aug-2014  Chief Complaint: confusion  Reason for Consult: confusion   History of Present Illness: History of Present Illness:   69 yo RHD F presents to [facility] from skilled nursing facility secondary to confusion.  Pt is unable to give much history of events but son is at bedside.  Son states that pt has been in SNF since his dad died one year ago and it became apparent that she had memory issues then.  In the SNF, pt is almost dependent on all ADLs requiring help to dress, bathe, etc.  Pt can feed herself and ambulate.  Son states that there has been a gradual decline over the past year but the last week has been worse.  The newest thing was that pt found to be unresponsive for at least two days in which she does not recall.  ROS:  General denies complaints   HEENT no complaints   Lungs no complaints   Cardiac no complaints   GI no complaints   GU no complaints   Musculoskeletal no complaints   Extremities no complaints   Skin no complaints   Neuro no complaints   Endocrine no complaints   Psych no complaints   Past Medical/Surgical Hx:  CVA:   DM:   Hypertension:   Past Medical/ Surgical Hx:  Past Medical History CVA, HTN, DM   Past Surgical History none   Home Medications: Medication Instructions Last Modified Date/Time  amitriptyline 25 mg oral tablet 1 tab(s) orally once a day (at bedtime) 16-Nov-15 13:38  aspirin-dipyridamole 25 mg-200 mg oral capsule, extended release 1 cap(s) orally 2 times a day 16-Nov-15 13:38  carvedilol 6.25 mg oral tablet 1 tab(s) orally 2 times a day 16-Nov-15 13:38  cholecalciferol 1000 intl units oral tablet 1 tab(s) orally 2 times a day 16-Nov-15 13:38  Coenzyme Q10 100 mg oral capsule 1 cap(s) orally once a day  16-Nov-15 13:38  furosemide 20 mg oral tablet 1 tab(s) orally once a day 16-Nov-15 13:38  acetaminophen-HYDROcodone 325 mg-5 mg oral tablet 1 tab(s) orally every 8 hours, As Needed - for Pain 16-Nov-15 13:38  Lantus 100 units/mL subcutaneous solution 74 unit(s) subcutaneous once a day (at bedtime) 16-Nov-15 13:38  lisinopril 40 mg oral tablet 1 tab(s) orally once a day 16-Nov-15 13:38  loratadine 10 mg oral tablet 1 tab(s) orally once a day 16-Nov-15 13:38  metFORMIN 1000 mg oral tablet 1 tab(s) orally once a day (in the morning) 16-Nov-15 13:38  NIFEdipine 60 mg oral tablet, extended release 1 tab(s) orally once a day 16-Nov-15 13:38  nystatin topical 100000 units/g topical cream Apply topically to affected area 2 times a day 16-Nov-15 13:38  pioglitazone 45 mg oral tablet 0.5 tab(s) orally once a day 16-Nov-15 13:38  simvastatin 20 mg oral tablet 1 tab(s) orally once a day (at bedtime) 16-Nov-15 13:38  venlafaxine extended release 150 mg oral capsule, extended release 1 cap(s) orally once a day 16-Nov-15 13:38   Allergies:  No Known Allergies:   Allergies:  Allergies NKDA   Social/Family History: Employment Status: retired  Lives With: alone  Living Arrangements: assisted living  Social History: no tob, no EtOH, no illicits  Family History: no seizures, no stroke per son   Vital Signs: **Vital Signs.:   17-Nov-15 07:38  Vital Signs Type  Q 8hr  Temperature Temperature (F) 98.7  Celsius 37  Temperature Source oral  Pulse Pulse 90  Respirations Respirations 18  Systolic BP Systolic BP 384  Diastolic BP (mmHg) Diastolic BP (mmHg) 86  Mean BP 110  Pulse Ox % Pulse Ox % 93  Pulse Ox Activity Level  At rest  Oxygen Delivery Room Air/ 21 %   Physical Exam: General: obese, NAD  HEENT: normocephalic, sclera nonicteric, oropharynx clear  Neck: supple, no JVD, no bruits  Chest: CTA B, no wheezing, good movement  Cardiac: RRR, no murmurs, no edema, 2+ pulses  Extremities: no  C/C/E, FROM   Neurologic Exam: Mental Status: sleepy but oriented to person only, good naming and repetition, no dysarthria, poor remote memory  Cranial Nerves: PERRLA, EOMI, nl VF, face symmetric, tongue midline, shoulder shrug equal  Motor Exam: 5/5 B normal, tone, no tremor  Deep Tendon Reflexes: 1+/4 B, plantars downgoing B, no Hoffman  Sensory Exam: pinprick, temperature, and vibration intact B  Coordination: FTN and HTS WNL, nl RAM   Lab Results: Thyroid:  16-Nov-15 10:15   Thyroid Stimulating Hormone 1.20 (0.45-4.50 (IU = International Unit)  ----------------------- Pregnant patients have  different reference  ranges for TSH:  - - - - - - - - - -  Pregnant, first trimetser:  0.36 - 2.50 uIU/mL)  Hepatic:  16-Nov-15 10:15   Bilirubin, Total 0.5  Alkaline Phosphatase 111 (46-116 NOTE: New Reference Range 05/02/14)  SGPT (ALT) 20 (14-63 NOTE: New Reference Range 05/02/14)  SGOT (AST) 20  Total Protein, Serum  8.3  Albumin, Serum 4.0  General Ref:  16-Nov-15 10:06   Thyroxine (T4) ========== TEST NAME ==========  ========= RESULTS =========  = REFERENCE RANGE =  THYROXINE(T4)  Thyroxine (T4) Thyroxine (T4)                  [   11.3 ug/dL           ]          4.5-12.0               [facility]            No: [id number]           [address] [place], [place] [address]           Lindon Romp, MD         [phone]   Result(s) reported on 29 Aug 2014 at 06:50AM.  Routine Chem:  16-Nov-15 10:15   Hemoglobin A1c [facility])  9.6 (The American Diabetes Association recommends that a primary goal of therapy should be <7% and that physicians should reevaluate the treatment regimen in patients with HbA1c values consistently >8%.)  17-Nov-15 04:31   Cholesterol, Serum 110  Triglycerides, Serum  215  HDL (INHOUSE)  38  VLDL Cholesterol Calculated  43  LDL Cholesterol Calculated 29 (Result(s) reported on 29 Aug 2014 at 05:12AM.)  Glucose, Serum  214   BUN 10  Creatinine (comp) 0.77  Sodium, Serum 141  Potassium, Serum 3.8  Chloride, Serum 107  CO2, Serum 25  Calcium (Total), Serum  8.1  Anion Gap 9  Osmolality (calc) 287  eGFR (African American) >60  eGFR (Non-African American) >60 (eGFR values <57m/min/1.73 m2 may be an indication of chronic kidney disease (CKD). Calculated eGFR, using the MRDR Study equation, is useful in  patients with stable renal function. The eGFR calculation will not be reliable in acutely ill patients when serum creatinine  is changing rapidly. It is not useful in patients on dialysis. The eGFR calculation may not be applicable to patients at the low and high extremes of body sizes, pregnant women, and vegetarians.)  Urine Drugs:  [id number] 57:84   Tricyclic Antidepressant, Ur Qual (comp) NEGATIVE (Result(s) reported on 28 Aug 2014 at 08:10PM.)  Amphetamines, Urine Qual. NEGATIVE  MDMA, Urine Qual. NEGATIVE  Cocaine Metabolite, Urine Qual. NEGATIVE  Opiate, Urine qual NEGATIVE  Phencyclidine, Urine Qual. NEGATIVE  Cannabinoid, Urine Qual. NEGATIVE  Barbiturates, Urine Qual. NEGATIVE  Benzodiazepine, Urine Qual. NEGATIVE (----------------- The URINE DRUG SCREEN provides only a preliminary, unconfirmed analytical test result and should not be used for non-medical  purposes.  Clinical consideration and professional judgment should be  applied to any positive drug screen result due to possible interfering substances.  A more specific alternate chemical method must be used in order to obtain a confirmed analytical result.  Gas chromatography/mass spectrometry (GC/MS) is the preferred confirmatory method.)  Methadone, Urine Qual. NEGATIVE  Cardiac:  16-Nov-15 10:15   Troponin I < 0.02 (0.00-0.05 0.05 ng/mL or less: NEGATIVE  Repeat testing in 3-6 hrs  if clinically indicated. >0.05 ng/mL: POTENTIAL  MYOCARDIAL INJURY. Repeat  testing in 3-6 hrs if  clinically indicated. NOTE: An increase or  decrease  of 30% or more on serial  testing suggests a  clinically important change)  CK, Total 74 (26-192 NOTE: NEW REFERENCE RANGE  11/14/2013)  CPK-MB, Serum 2.7 (Result(s) reported on 28 Aug 2014 at 10:57AM.)  Routine UA:  16-Nov-15 10:04   Color (UA) Yellow  Clarity (UA) Hazy  Glucose (UA) >=500  Bilirubin (UA) Negative  Ketones (UA) 2+  Specific Gravity (UA) 1.016  Blood (UA) 1+  pH (UA) 5.0  Protein (UA) 100 mg/dL  Nitrite (UA) Negative  Leukocyte Esterase (UA) Negative (Result(s) reported on 28 Aug 2014 at 10:51AM.)  RBC (UA) 1 /HPF  WBC (UA) <1 /HPF  Bacteria (UA) TRACE  Epithelial Cells (UA) NONE SEEN  Hyaline Cast (UA) 3 /LPF (Result(s) reported on 28 Aug 2014 at 10:51AM.)  Routine Coag:  16-Nov-15 10:15   Prothrombin 13.6  INR 1.1 (INR reference interval applies to patients on anticoagulant therapy. A single INR therapeutic range for coumarins is not optimal for all indications; however, the suggested range for most indications is 2.0 - 3.0. Exceptions to the INR Reference Range may include: Prosthetic heart valves, acute myocardial infarction, prevention of myocardial infarction, and combinations of aspirin and anticoagulant. The need for a higher or lower target INR must be assessed individually. Reference: The Pharmacology and Management of the Vitamin K  antagonists: the seventh ACCP Conference on Antithrombotic and Thrombolytic Therapy. ONGEX.5284 Sept:126 (3suppl): [id number]. A HCT value >55% may artifactually increase the PT.  In one study,  the increase was an average of 25%. Reference:  "Effect on Routine and Special Coagulation Testing Values of Citrate Anticoagulant Adjustment in Patients with High HCT Values." American Journal of Clinical Pathology 2006;126:400-405.)  Routine Hem:  17-Nov-15 04:31   WBC (CBC) 9.6  RBC (CBC) 3.85  Hemoglobin (CBC)  11.6  Hematocrit (CBC) 36.1  Platelet Count (CBC)  148  MCV 94  MCH 30.1  MCHC 32.1  RDW  13.5  Neutrophil % 70.1  Lymphocyte % 19.1  Monocyte % 10.2  Eosinophil % 0.3  Basophil % 0.3  Neutrophil #  6.7  Lymphocyte # 1.8  Monocyte #  1.0  Eosinophil # 0.0  Basophil # 0.0 (Result(s) reported on 29 Aug 2014  at 05:05AM.)   Radiology Results: MRI:    17-Nov-15 09:29, MRI Brain Without Contrast  MRI Brain Without Contrast   REASON FOR EXAM:    CVA  COMMENTS:       PROCEDURE: MR  - MR BRAIN WO CONTRAST  - Aug 29 2014  9:29AM     CLINICAL DATA:  Unwitnessed fall. Altered mental status. Evaluate  for CVA. Initial encounter.    EXAM:  MRI HEAD WITHOUT CONTRAST    TECHNIQUE:  Multiplanar, multiecho pulse sequences of the brain and surrounding  structures were obtained without intravenous contrast.  COMPARISON:  CT head 08/28/2014.    FINDINGS:  The patient was unable to remain motionless for the exam. Small or  subtle lesions could be overlooked. Fast protocol was employed. The  study is marginally diagnostic.    No evidence for acute infarction, hemorrhage, mass lesion,  hydrocephalus, or extra-axial fluid. Generalized cerebral and  cerebellar atrophy.    Moderatelyadvanced T2 and FLAIR hyperintensities throughout  periventricular and subcortical white matter, consistent with  chronic microvascular ischemic change. There are numerous areas  lacunar infarction involving the deep gray nuclei and brainstem of a  chronic nature. These correspond to the hypodensities noted on the  recent CT.    Partial empty sella. Flow voids are maintained. No acute sinus or  mastoid disease.     IMPRESSION:  Motion degraded exam demonstrating no acute intracranial findings.    Advanced chronic microvascular ischemic change.    With regard to the deep nuclei hypodensities seen on CT, these  represent areas of chronic lacunar infarction.    No visible posttraumatic sequelae.  Electronically Signed    By: Rolla Flatten M.D.  On: 08/29/2014 09:51         Verified By: Staci Righter, M.D.,  CT:    16-Nov-15 11:00, CT Head Without Contrast  CT Head Without Contrast   REASON FOR EXAM:    confusion, delerium, altered mental  COMMENTS:       PROCEDURE: CT  - CT HEAD WITHOUT CONTRAST  - Aug 28 2014 11:00AM     CLINICAL DATA:  Found on floor, unwitnessed fall. Pain. Altered  mental status.    EXAM:  CT HEAD WITHOUT CONTRAST    CT CERVICAL SPINE WITHOUT CONTRAST    TECHNIQUE:  Multidetector CT imaging of the head and cervical spine was  performed following the standard protocol without intravenous  contrast. Multiplanar CT image reconstructions of the cervical spine  were also generated.    COMPARISON:  None.    FINDINGS:  CT HEAD FINDINGS    There are scattered small foci of low attenuation in the caudate  nuclei, basal ganglia and thalami. No additional evidence of an  acute infarct, acute hemorrhage, mass lesion, mass effect or  hydrocephalus. Atrophy with confluent low-attenuation in the  periventricular deep white matter. Near-complete opacification of  the left sphenoid sinus. No air-fluid levels in the visualized  portions of the paranasal sinuses ormastoid air cells.    CT CERVICAL SPINE FINDINGS    There is slight reversal of the normal cervical lordosis. Alignment  is otherwise anatomic. No fracture. Endplate degenerative changes,  loss of disc space height and uncovertebral hypertrophy are worst at  C5-6.    Moderate to severe left neural foraminal narrowing at C2-3. Mild  neural foraminal narrowing on the left at C4-5.    Low-attenuation nodules in the thyroid measured 1.5 cm on the left.  Visualized lung apices  are clear.   IMPRESSION:  1. No evidence of acute trauma. Scattered low densities in the  caudate nuclei, basal ganglia and thalamus may represent lacunar  infarcts, age indeterminate.  2. Reversal of the normal cervical lordosis without subluxation or  fracture.  3. Atrophyand chronic microvascular white matter  ischemic changes.  4. Spondylosis, worst at C5-6.  5. Low-attenuation thyroid nodules. Consider further evaluation with  thyroid ultrasound. If patient is clinically hyperthyroid, consider  nuclear medicine thyroid uptake and scan.      Electronically Signed    By: Lorin Picket M.D.    On: 08/28/2014 11:47         Verified By: Luretha Rued, M.D.,   Radiology Impression: Radiology Impression: MRI of brain personally reviewed by me and shows moderate atrophy and mild white matter changes   Impression/Recommendations: Recommendations:   prior notes reviewed by me reviewed by me   Possible seizure-  pt was unresponsive for 2 days without recollection with a mildly elevated WBC which could be seizure vs. some encephalopathy Probable dementia-  pt has atrophy on MRI plus hx consistent with this Neuropsychiatric testing as outpatient EEG check B12/folate, RPR, LFTs needs ASA 7m daily will follow  Electronic Signatures: SJamison Neighbor(MD)  (Signed 17-Nov-15 11:12)  Authored: REFERRING PHYSICIAN, Primary Care Physician, Consult, History of Present Illness, Review of Systems, PAST MEDICAL/SURGICAL HISTORY, HOME MEDICATIONS, ALLERGIES, Social/Family History, NURSING VITAL SIGNS, Physical Exam-, LAB RESULTS, RADIOLOGY RESULTS, Recommendations   Last Updated: 17-Nov-15 11:12 by SJamison Neighbor(MD)

## 2015-02-03 NOTE — Discharge Summary (Signed)
PATIENT NAME:  Yvette Gutierrez, Yvette Gutierrez MR#:  [id number] DATE OF BIRTH:  10-Aug-1946  DATE OF ADMISSION:  10/07/2014 DATE OF DISCHARGE:  10/07/2014  DISCHARGE DIAGNOSIS:  Left supracondylar femur fracture, status post fall, not a surgical candidate. (The patient would not want surgery. Pain under control. The patient was placed in a knee immobilizer.)   SECONDARY DIAGNOSES:  1.  Diabetes.  2.  Hypertension.  3.  Cerebrovascular accident/transient ischemic attack.  4.  Dementia.  5.  Depression.  6.  Hyperlipidemia.   CONSULTATIONS: Orthopedics, Ruthe Mannan, MD  PROCEDURES AND RADIOLOGY: Left femur x-ray on the 25th of December showed severely comminuted fracture of the distal left femoral impaction, override and dorsal angulation.   Chest x-ray on the 26th of December showed no acute cardiopulmonary disease. Severe degenerative changes of the shoulders.   UA on admission was negative.   HISTORY AND SHORT HOSPITAL COURSE: The patient is a 69 year old female with the above-mentioned medical problems who was admitted with an injury to the left lower extremity following a mechanical fall. She was found to have a severely comminuted fracture of the distal left metaphysis of the femur with impaction and dorsal angulation. Please see Dr. Reddy's dictated history and physical for further details. Orthopedic consultation was obtained with Dr. Ruthe Mannan who recommended nonsurgical management as the patient also preferred not to undergo  surgery. At baseline, the patient is a very minimal ambulator, and even after surgery she was considered to be less likely able to ambulate so the decision was made to do conservative management. The patient was placed in a knee immobilizer and recommended assisted transfer with nonweightbearing on her left lower extremity.   The patient's pain was much better controlled and was discharged back to her assisted living facility on the 26th of December. On the date of discharge, her  vital signs were as follows: Temperature 98, heart rate 90 per minute, respirations 18 per minute, blood pressure 131/81. She was saturating 94% on room air.   PERTINENT PHYSICAL EXAMINATION ON THE DATE OF DISCHARGE:  CARDIOVASCULAR: S1, S2 normal. No murmurs, rubs or gallop.  LUNGS: Clear to auscultation bilaterally. No wheezes, rales, rhonchi or crepitation.  ABDOMEN: Soft, benign.  NEUROLOGIC: Nonfocal examination.  LOWER EXTREMITIES: She has significant tenderness in the left lower extremity with only a knee immobilizer in place. All other physical examination remained at baseline.   DISCHARGE MEDICATIONS:   Medication Instructions  amitriptyline 25 mg oral tablet  1 tab(s) orally once a day (at bedtime)   carvedilol 6.25 mg oral tablet  1 tab(s) orally 2 times a day   coenzyme q10 100 mg oral capsule  1 cap(s) orally once a day   furosemide 20 mg oral tablet  1 tab(s) orally once a day   lantus 100 units/ml subcutaneous solution  74 unit(s) subcutaneous once a day (at bedtime)   lisinopril 40 mg oral tablet  1 tab(s) orally once a day   nifedipine 60 mg oral tablet, extended release  1 tab(s) orally once a day   pioglitazone 45 mg oral tablet  0.5 tab(s) orally once a day   simvastatin 20 mg oral tablet  1 tab(s) orally once a day (at bedtime)   venlafaxine extended release 150 mg oral capsule, extended release  1 cap(s) orally once a day   metformin 1000 mg oral tablet  1 tab(s) orally once a day   vimpat 50 mg oral tablet  1 tab(s) orally 2 times a day  vitamin d3 1000 intl units oral tablet  1 tab(s) orally 2 times a day   aggrenox 25 mg-200 mg oral capsule, extended release  1 cap(s) orally 2 times a day   tylenol extra strength 500 mg oral tablet  1 tab(s) orally every 6 hours   acetaminophen-oxycodone 325 mg-5 mg oral tablet  1 tab(s) orally every 6 hours, As Needed, moderate pain (4-6/10) , As needed, moderate pain (4-6/10)   docusate-senna 50 mg-8.6 mg oral tablet  2 tab(s)  orally once a day (at bedtime) - for Constipation     DISCHARGE DIET: Low sodium.   DISCHARGE ACTIVITY: Nonweightbearing to her left lower extremity with knee immobilizer. Assisted transfers.   DISCHARGE INSTRUCTIONS AND FOLLOWUP: The patient was instructed to follow up with her primary care physician, Dr. Crawford Givens, in 1 to 2 weeks. She will need followup with [facility] in 2 to 4 weeks.   TOTAL TIME DISCHARGING THIS PATIENT: 40 minutes.    ____________________________ Ellamae Sia. Sherryll Burger, MD vss:JT D: 10/08/2014 05:25:54 ET T: 10/08/2014 06:34:05 ET JOB#: [id number]  cc: Ziggy Reveles S. Sherryll Burger, MD, <Dictator> Dwana Curd. Para March, MD [facility] Ruthe Mannan, MD Ellamae Sia [facility] MD ELECTRONICALLY SIGNED 10/09/2014 8:35

## 2015-02-03 NOTE — H&P (Signed)
PATIENT NAME:  Yvette, Gutierrez MR#:  [id number] DATE OF BIRTH:  [birth year]  DATE OF ADMISSION:  08/28/2014  PRIMARY CARE PHYSICIAN:  Dwana Curd. Para March, MD, in [place].  REFERRING EMERGENCY ROOM PHYSICIAN:  Loraine Leriche R. Fanny Bien, MD   CHIEF COMPLAINT:  Altered mental status.   HISTORY OF PRESENT ILLNESS:  This 69 year old woman with past medical history of diabetes, CVA in the remote past, and hypertension presents today with greater than one week of confusion. The history is provided by her son, as the patient is very confused at this time. He reports that she has had progressive memory loss for short-term memory for several months. However, over the past few weeks this has gotten worse. In the last week, she became acutely disoriented. He noted that she was sometimes not making sense when she was speaking. She was occasionally hallucinating. One of her sons called her yesterday, and she did not answer the phone, which is unusual for her. She presents to the Emergency Room today after she was found on the floor at her living facility, [facility]. She was screaming out in pain, but was not clear about the site of pain. It is unclear how long she had been on the floor.   PAST MEDICAL HISTORY: 1.  Diabetes mellitus type 2.  2.  History of CVA.  3.  Hypertension.   SOCIAL HISTORY:  She has lived in [facility] assisted living facility for about one year. She has a Copy. She does not use any equipment for ambulation. She does not smoke cigarettes or drink alcohol or use any illicit substances.   FAMILY MEDICAL HISTORY:  Her father had a CVA. Her mother had dementia. There is no family history of coronary artery disease or colon or breast cancer.   ALLERGIES:  No known allergies.   HOME MEDICATIONS: 1.  Venlafaxine 150 mg 1 capsule daily.  2.  Simvastatin 20 mg 1 tablet daily.  3.  Pioglitazone 45 mg 0.5 tablets orally once a day.  4.  Nystatin 100,000 units per gram topical cream applied to  affected area twice a day.  5.  Nifedipine 60 mg 1 tablet once a day.  6.  Metformin 1000 mg 1 tablet once a day in the morning.  7.  Loratadine 10 mg 1 tablet once a day.  8.  Lisinopril 40 mg 1 tablet once a day.  9.  Lantus 74 units once a day at bedtime.  10.  Furosemide 20 mg 1 tablet once a day.  11.  Coenzyme Q 100 mg 1 capsule once a day.  12.  Cholecalciferol 1000 international units 1 tablet twice a day.  13.  Carvedilol 6.25 mg 1 tablet orally twice a day.  14.  Aspirin dipyridamole 25 mg/200 mg extended release 1 capsule twice a day.  15.  Amitriptyline 25 mg 1 tablet once a day at bedtime.  16.  Acetaminophen/hydrocodone 325 mg/5 mg 1 tablet every 8 hours as needed for pain.   REVIEW OF SYSTEMS:  The patient is unable to give the review of systems due to confusion.   PHYSICAL EXAMINATION: VITAL SIGNS:  Temperature is 98.3, pulse 104, respirations 18, blood pressure 145/75, oxygenation 92% on room air.  GENERAL:  No acute distress, slightly anxious.  HEENT:  Pupils are equal, round, and reactive to light. Conjunctivae are clear. Extraocular motion is intact. Mucous membranes are dry. Good dentition. Posterior oropharynx is clear with no exudate or edema.  NECK:  Obese.  RESPIRATORY:  Lungs are clear to auscultation bilaterally with good air movement.  CARDIOVASCULAR:  Distant heart sounds, regular rate and rhythm. No murmurs, rubs, or gallops.  ABDOMEN:  Soft, nontender, nondistended. Bowel sounds are normal.  MUSCULOSKELETAL:  Strength is 4/5 throughout, diffuse weakness. No swollen tender joints. Range of motion is normal.  NEUROLOGIC:  Cranial nerves II through XII are grossly intact. Strength is generally decreased. Sensation is intact.  PSYCHIATRIC:  The patient is completely disoriented, cannot answer most questions, and refers to her son or says she does not know. She does seem slightly anxious, may be aware of her lack of cognition.   LABORATORY DATA:  Sodium is 141,  potassium 4.2, chloride 106, bicarbonate 19, BUN 14, creatinine 0.92, and glucose is 378. Hemoglobin A1c is 9.6. LFTs are normal. Cardiac enzymes are negative. TSH is 1.2. Urine drug screen is negative. White blood cell count is 12.2, hemoglobin 14.2, platelets 193,000, and MCV is 94. Urinalysis is negative for signs of infection.   IMAGING:   1.  CT of the head and cervical spine showed no evidence of acute trauma.  Scattered low densities in caudate nuclei, basal ganglia, and thalamus may represent lacunar infarcts, age indeterminate. Also, chronic atrophy and microvascular white matter ischemic changes. Also, low-attenuation thyroid nodules.  2.  Chest x-ray:  No acute findings. Chronic fracture deformities involving humeral heads and lower thoracic spine.  3.  X-ray of the lumbar spine shows no acute findings. Chronic T11 compression fracture with atherosclerosis.   ASSESSMENT AND PLAN: 1.  Altered mental status. CT shows several strokes of indeterminate age. We will get an MRI. Check urine toxicology screen, TSH, and T4. Check a B12 level. Check an ABG and obtain neurology consult. The patient seems delirious. She has a nonfocal neurologic examination, but is acutely disoriented. Urine shows no signs of infection. Chest x-ray does not indicate any sort of pneumonia. She is afebrile. I am not starting empiric antibiotics on this patient. I will start aspirin and statin therapy for possibility of acute stroke. I will not order a 2-D echocardiogram and carotid Dopplers unless MRI results are positive.  2.  Progressive decline. We will consult physical therapy for evaluation. She will need case management and social work for possible placement.  3.  Compression fracture. On x-ray, it looks like this is a chronic compression fracture; however, in speaking with her son, there does not seem to be a history of compression fracture, and she does complain of back pain. In order to avoid sedation or further  confusion, we will provide Tylenol for pain. The compression fracture may need to be evaluated further in the future.  4.  Diabetes mellitus type 2. Check hemoglobin A1c. Continue with sliding scale and Lantus.  5.  Hypertension, controlled at this time. We will allow for permissive hypertension in the setting of possible stroke.  6.  Deep vein thrombosis prophylaxis with heparin.   TIME SPENT ON ADMISSION:  45 minutes.    ____________________________ Ena Dawley. Clent Ridges, MD cpw:nb D: 08/28/2014 21:36:06 ET T: 08/28/2014 21:57:21 ET JOB#: [id number]  cc: Santina Evans P. Clent Ridges, MD, <Dictator> Gale Journey MD ELECTRONICALLY SIGNED 08/31/2014 13:21

## 2015-02-03 NOTE — Discharge Summary (Signed)
PATIENT NAME:  Yvette Gutierrez, Yvette Gutierrez MR#:  [id number] DATE OF BIRTH:  [birth year]  DATE OF ADMISSION:  08/28/2014 DATE OF DISCHARGE:  08/30/2014  ADMISSION DIAGNOSIS: Altered mental status.   DISCHARGE DIAGNOSES:  1. Encephalopathy, acute, with possible seizure 2. Probable dementia. 3. Essential hypertension.  4. Diabetes.   CONSULTATIONS: Neurology.   IMAGING: The patient had MRI of the brain which showed atrophy, but nothing acute.  EEG slow waves ESR was slightly elevated at 52, RPR was negative.   LDL 29, cholesterol 110.   HOSPITAL COURSE: This is a 69 year old female who was admitted on 08/28/2014 with altered mental status. For further details, please refer to H and P. 1. Acute encephalopathy: Neurology was consulted regarding her acute encephalopathy. It was thought that she may have had a seizure. EEG showed slow waves. Given these findings Neurology recommended to start VIMPAT 50 mg PO BID and no dricing for 6 months. She appears at her baseline. MRI Head was negative for CVA or an acute abnormalities. 2. Probably dementia as per neurology. The patient will need outpatient evaluation and was scheduled an outpatient evaluation at first available with neurology. 3. Essential hypertension. The patient will remain on her outpatient medications.  4. Diabetes without complications. The patient will continue outpatient medications.   DISCHARGE MEDICATIONS:  1. Amitriptyline 25 mg at bedtime.  2. Aggrenox 1 tablet p.o. b.i.d.  3. Coreg 6.25 b.i.d.  4. Cholecalciferol 1000 international units b.i.d.  5. Co-Enzyme Q 100 mg b.i.d.  6. Lasix 20 mg daily.  7. Lantus 34 units at bedtime.  8. Lisinopril 40 mg daily.  9. Metformin 1000 mg daily.  10. Nifedipine 60 mg daily.  11. Pioglitazone 45 mg 1/2 tablet daily.  12. Simvastatin 20 mg at bedtime.  13. Effexor 150 mg daily.  14. Acetaminophen hydrocodone  5/325 mg q.8 hours p.r.n.  15. Vipmat 50 mg PO BID  DISCHARGE DIET: ADA diet.    DISCHARGE ACTIVITY: As tolerated.   DISCHARGE FOLLOWUP:   1. The patient will follow up with neurology as scheduled.  2. We will also go ahead and order a Holter monitor, she already has a lifeline..  3. The patient will need to go to rehab at discharge. She sees Elsie Stain, MD, in [place].   TIME SPENT: Approximately 40 minutes.  ____________________________ Donell Beers. Benjie Karvonen, MD spm:kl D: 08/30/2014 12:30:49 ET T: 08/30/2014 19:16:41 ET JOB#: [id number]  cc: Marlin Brys P. Benjie Karvonen, MD, <Dictator> Donell Beers Joby Hershkowitz MD ELECTRONICALLY SIGNED 08/30/2014 20:52

## 2015-02-03 NOTE — H&P (Signed)
PATIENT NAME:  Yvette Gutierrez, Yvette Gutierrez MR#:  [id number] DATE OF BIRTH:  Sep 05, 1946  DATE OF ADMISSION:  10/07/2014  PRIMARY CARE PHYSICIAN: Dwana Curd. Para March, MD  REFERRING PHYSICIAN: Eartha Inch. York Cerise, MD  ORTHOPEDIC PHYSICIAN: Ruthe Mannan, MD  CHIEF COMPLAINT: Injury to left lower extremity following a mechanical fall.    HISTORY OF PRESENT ILLNESS: A 69 year old Caucasian female with a past medical history significant for hypertension, diabetes mellitus type 2, history of CVA, dementia, depression, hyperlipidemia, and probable seizure disorder, who presents to the Emergency Room with a history of a fall with injury to the left lower extremity. The patient states while she was ambulating she tripped and fell down and sustained injury to the left lower extremity, following which she was brought to the Emergency Room for further evaluation. The patient was evaluated by the ED physician and was found to have a left supracondylar fracture of the femur, which is comminuted with angulation. The orthopedic service on-call was consulted by the ED physician, who evaluated the patient and had a lengthy discussion with the patient and the patient's family, and in view of her multiple medical problems and the current physical condition of the patient, the patient was deemed not a surgical candidate for the femur fracture; hence, was advised medical admission for pain control and supportive care. The patient denies any history of chest pain, shortness of breath, loss of consciousness, or syncopal episodes causing the fall, and she said this was purely a mechanical fall secondary to tripping. The patient received multiple doses of IV pain medications in the Emergency Room; still she has significant pain. Otherwise, denies any complaints such as chest pain, shortness of breath, or dizziness. No nausea, vomiting, or diarrhea.    PAST MEDICAL HISTORY:  1.  Diabetes mellitus type 2.  2.  Hypertension.  3.  CVA/TIA.  4.   Dementia.  5.  Depression.  6.  Hyperlipidemia.   7.  Probable seizure, which was diagnosed in November 2015.   ALLERGIES: No known drug allergies.   HOME MEDICATIONS: 1.  Acetaminophen/hydrocodone 325/5 mg tablet 1 tablet orally q. 8 hours p.r.n. for pain.  2.  Aggrenox 25/200 mg capsule orally 1 capsule 2 times a day.  3.  Amitriptyline 25 mg tablet 1 tablet orally once a day.  4.  Carvedilol 6.25 mg oral tablet 1 tablet orally 2 times a day.  5.  Coenzyme Q10 at 100 mg capsule orally 1 capsule once a day.  6.  Furosemide 20 mg tablet orally 1 tablet orally once a day.   7.  Lantus 74 units subcutaneously once a day at bedtime.  8.  Lisinopril 40 mg tablet 1 tablet orally once a day.  9.  Metformin 1000 mg tablet 1 tablet orally twice a day.  10.  Nifedipine extended release 60 mg tablet orally 1 tablet once a day.  11.  Pioglitazone 45 mg tablet 1/2 tablet orally once a day.  12.  Simvastatin 20 mg tablet 1 tablet orally once a day.  13.  Tylenol extra strength 500 mg tablet 1 tablet orally q. 8 hours p.r.n.  14.  Venlafaxine extended release 150 mg oral capsule 1 capsule orally once a day.  15.  Vimpat 50 mg tablet 1 tablet orally 2 times a day.  16.  Vitamin D3 at 1000 units 1 tablet orally 2 times a day.   SOCIAL HISTORY: She lives in an assisted living place. 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 No history of  smoking, alcohol, or illicit substance usage.   FAMILY HISTORY: Father with CVA. Mother with dementia.   REVIEW OF SYSTEMS:  CONSTITUTIONAL: Negative for fever, fatigue, generalized weakness.  EYES: Negative for blurred vision, double vision. No pain. No redness. No inflammation.  EARS, NOSE, AND THROAT: Negative for tinnitus, ear pain, hearing loss, epistaxis, nasal discharge, or difficulty swallowing.  RESPIRATORY: Negative for cough, wheezing, hemoptysis, dyspnea, painful respirations.  CARDIOVASCULAR: Negative for chest pain, orthopnea, pedal edema, dyspnea on  exertion, palpitations, syncope, or dizziness.  GASTROINTESTINAL: Negative for nausea, vomiting, diarrhea, abdominal pain, hematemesis, melena, GERD symptoms, or rectal bleeding.  GENITOURINARY: Negative for dysuria, hematuria, frequency, or urgency.  ENDOCRINE: Negative for polyuria or nocturia. No thyroid problems.  HEMATOLOGIC: Negative for anemia, easy bruising, bleeding, or swollen glands.  INTEGUMENTARY: Negative for acne, skin rash, or lesions.  MUSCULOSKELETAL: Significant pain of left lower extremity following fall secondary to tripping.   NEUROLOGICAL: Negative for focal weakness or numbness. No history of CVA or TIA. History of seizure disorder, which was diagnosed in November 2015 and is stable.   PSYCHIATRIC: Positive for depression, takes medication, and is under control.   PHYSICAL EXAMINATION:  VITAL SIGNS: Pulse rate 80 per minute, respirations 14 per minute, blood pressure 169/71, O2 saturation is 96% on room air.  GENERAL: Well-nourished, well-developed elderly female, alert, awake, and oriented. In moderate distress secondary to left lower extremity pain.   HEAD: Atraumatic, normocephalic.  EYES: Pupils equal, react to light and accommodation. No conjunctival pallor. No scleral icterus. Extraocular movements intact.  NOSE: No nasal lesions. No drainage.  EARS: No drainage. No external lesions.  ORAL CAVITY: No mucosal lesions. No exudates.  NECK: Supple. No JVD. No thyromegaly. No carotid bruit. Range of motion of neck normal.  RESPIRATORY: Good respiratory effort. Not using accessory muscles of respiration. Clear to auscultation bilaterally. No rales or rhonchi.  CARDIOVASCULAR: S1, S2 regular. No murmurs appreciated. No gallops. No clicks. Peripheral pulses equal at carotid, femoral, and pedal pulses. No peripheral edema.  GASTROINTESTINAL: Abdomen is soft, obese, nontender. Bowel sounds present and equal in all 4 quadrants. No rebound. No guarding. No rigidity.   MUSCULOSKELETAL: Left lower extremity with external rotation deformity. Severe tenderness in the lower end of the femur. Superficial bruising present. Range of motion of left lower extremity limited secondary to pain. Neuromuscular intact in the left foot.  SKIN: Inspection within normal limits.  LYMPHATIC: No cervical lymphadenopathy.  VASCULAR: Good dorsalis pedis and posterior tibial pulses bilaterally.  NEUROLOGIC: Alert, awake, and oriented x 3. Cranial nerves II to XII grossly intact. DTRs 2+ bilateral symmetrical. Motor strength 5/5 in other extremities. The patient is not able to move the left lower extremity because of severe pain secondary to fracture.  PSYCHIATRIC: Judgment and insight are adequate. Alert and oriented x 3. Memory and mood within normal limits.   LABORATORY DATA: Serum glucose 166, BUN 26, creatinine 1.2, serum sodium 138, potassium 4.1, chloride 105, bicarbonate 27, total calcium 9.1. Magnesium 2.0. Lipase 278. Total protein 7.5, albumin 3.8, bilirubin 0.2, alkaline phosphatase 76, AST 13, ALT 19. Troponin less than 0.02. WBC 8.2, hemoglobin 12.3, hematocrit 37.9, platelet count 178,000. Prothrombin time 13.1, INR 1.0.   IMAGING STUDIES: Left femur x-ray: Severely comminuted fracture of the distal left femur  metaphysis with impaction, and dorsal angulation.    EKG: Normal sinus rhythm with ventricular rate of 85 beats per minute, PVCs present, and anterior infarct, age undetermined.   ASSESSMENT AND PLAN: A 69 year old Caucasian female  with a past medical history significant for diabetes mellitus type 2, hypertension, cerebrovascular accident, dementia, depression, hyperlipidemia, probable seizure disorder, who presents to the Emergency Room with complaints of severe acute pain, left lower extremity, following a mechanical fall.  1.  Left supracondylar fracture of the femur following a mechanical fall secondary to tripping. The patient was evaluated by the orthopedic  surgeon on-call in the Emergency Room and was deemed to be a nonsurgical candidate, and according to the Emergency Department physician, the patient's family also refused for any surgical intervention. Hence, advised admission to the medical floor for pain management and supportive care. Plan: Admit to medical floor. Bed rest at this time. Aggressive pain control measures. Occupational therapy/physical therapy consultations and orthopedic followup and recommendations.  2.  Diabetes mellitus type 2. The patient is stable clinically. We will continue oral medications. We also continue Lantus at a reduced dosage. Sliding scale insulin and followup blood sugars.  3.  Hypertension, stable on home medications. Continue the same.  4.  History of cerebrovascular accident, transient ischemic attack, stable clinically. Continue home medications.  5.  History of probable dementia, stable. Continue supportive care.  6.  History of probable seizure disorder, stable on Vimpat. Continue same.  7.  History of depression, stable on home medications. Continue same.  8.  History of hyperlipidemia, stable on statins. Continue same.  9.  Deep vein thrombosis prophylaxis with subcutaneous Lovenox.  10.  Gastrointestinal prophylaxis with Protonix.   CODE STATUS: FULL CODE.   TIME SPENT: 55 minutes.  ____________________________ Crissie Figures, MD enr:ts D: 10/07/2014 02:02:35 ET T: 10/07/2014 02:56:26 ET JOB#: [id number]  cc: Crissie Figures, MD, <Dictator> Dwana Curd. Para March, MD Crissie Figures MD ELECTRONICALLY SIGNED 10/08/2014 20:39

## 2015-02-13 ENCOUNTER — Non-Acute Institutional Stay (SKILLED_NURSING_FACILITY): Payer: Medicare Other | Admitting: Adult Health

## 2015-02-13 DIAGNOSIS — E1340 Other specified diabetes mellitus with diabetic neuropathy, unspecified: Secondary | ICD-10-CM | POA: Diagnosis not present

## 2015-02-13 DIAGNOSIS — E1142 Type 2 diabetes mellitus with diabetic polyneuropathy: Secondary | ICD-10-CM | POA: Diagnosis not present

## 2015-02-13 DIAGNOSIS — G629 Polyneuropathy, unspecified: Secondary | ICD-10-CM

## 2015-02-13 DIAGNOSIS — K429 Umbilical hernia without obstruction or gangrene: Secondary | ICD-10-CM

## 2015-02-13 DIAGNOSIS — E785 Hyperlipidemia, unspecified: Secondary | ICD-10-CM | POA: Diagnosis not present

## 2015-02-13 DIAGNOSIS — G40909 Epilepsy, unspecified, not intractable, without status epilepticus: Secondary | ICD-10-CM | POA: Diagnosis not present

## 2015-02-13 DIAGNOSIS — I10 Essential (primary) hypertension: Secondary | ICD-10-CM | POA: Diagnosis not present

## 2015-02-13 DIAGNOSIS — I639 Cerebral infarction, unspecified: Secondary | ICD-10-CM

## 2015-02-13 DIAGNOSIS — F330 Major depressive disorder, recurrent, mild: Secondary | ICD-10-CM

## 2015-02-13 DIAGNOSIS — K5901 Slow transit constipation: Secondary | ICD-10-CM | POA: Diagnosis not present

## 2015-02-14 ENCOUNTER — Non-Acute Institutional Stay (SKILLED_NURSING_FACILITY): Payer: Medicare Other | Admitting: Internal Medicine

## 2015-02-14 DIAGNOSIS — E785 Hyperlipidemia, unspecified: Secondary | ICD-10-CM

## 2015-02-14 DIAGNOSIS — M19012 Primary osteoarthritis, left shoulder: Secondary | ICD-10-CM

## 2015-02-14 DIAGNOSIS — K429 Umbilical hernia without obstruction or gangrene: Secondary | ICD-10-CM | POA: Diagnosis not present

## 2015-02-14 DIAGNOSIS — F330 Major depressive disorder, recurrent, mild: Secondary | ICD-10-CM

## 2015-02-14 DIAGNOSIS — G40909 Epilepsy, unspecified, not intractable, without status epilepticus: Secondary | ICD-10-CM | POA: Diagnosis not present

## 2015-02-14 DIAGNOSIS — I10 Essential (primary) hypertension: Secondary | ICD-10-CM | POA: Diagnosis not present

## 2015-02-14 DIAGNOSIS — I639 Cerebral infarction, unspecified: Secondary | ICD-10-CM

## 2015-02-14 DIAGNOSIS — M19011 Primary osteoarthritis, right shoulder: Secondary | ICD-10-CM | POA: Diagnosis not present

## 2015-02-14 DIAGNOSIS — E1142 Type 2 diabetes mellitus with diabetic polyneuropathy: Secondary | ICD-10-CM | POA: Diagnosis not present

## 2015-02-14 DIAGNOSIS — G629 Polyneuropathy, unspecified: Secondary | ICD-10-CM

## 2015-02-14 DIAGNOSIS — K5901 Slow transit constipation: Secondary | ICD-10-CM | POA: Diagnosis not present

## 2015-02-14 NOTE — Progress Notes (Signed)
Patient ID: Yvette Gutierrez, female   DOB: 1946-05-23, 69 y.o.   MRN: [id number]     [facility]   PCP: Crawford Givens, MD  Code Status: full code  No Known Allergies  Chief Complaint  Patient presents with  . New Admit To SNF     HPI:  69 year old patient is here for long term care. She was residing at [facility] prior to this. She has PMH of HTN, type 2 DM, constipation, old CVA, dementia, compression fracture of vertebrae, OSA, neuropathy, depression, OA among others. She is seen in her room today. She is sitting on her wheelchair and reading a magazine. She is in no distress. She denies any concerns this visit. No new concern from staff. She is incontinent with her bowel and bladder. She can make her needs known. She needs assistance with bathing and dressing. No skin concerns. No fall in the facility.   Review of Systems:  Constitutional: Negative for fever, chills, malaise/fatigue and diaphoresis.  HENT: Negative for headache, congestion, nasal discharge, hearing loss, earache, sore throat, difficulty swallowing.   Eyes: Negative for eye pain, blurred vision, double vision and discharge.  Respiratory: Negative for cough, shortness of breath and wheezing.   Cardiovascular: Negative for chest pain, palpitations, leg swelling.  Gastrointestinal: Negative for heartburn, nausea, vomiting, abdominal pain, constipation. appetite is good. Genitourinary: Negative for dysuria, flank pain.  Musculoskeletal: Negative for falls. Has back and shoulder pain Skin: Negative for itching, rash.  Neurological: Negative for dizziness, tingling. Has lower extremity weakness Psychiatric/Behavioral: Negative for memory loss. Has history of depression but mood has been stable as per patient   Past Medical History  Diagnosis Date  . Allergic rhinitis   . Depression   . Diabetes mellitus type II   . Neuropathy due to secondary diabetes   . HTN (hypertension)    . Obesity   . CVA (cerebral infarction) 2002    Chronic unsteadiness  . Bilateral shoulder pain     chronic-uses vicodin a few times a month for pain control  . Femur fracture, left 09/2014    nonsurgical treatment, immobilized  . Dementia   . Hyperlipidemia    Past Surgical History  Procedure Laterality Date  . Nsvd  [phone]  . Ventral hernia repair  1999   Social History:   reports that she has never smoked. She has never used smokeless tobacco. She reports that she does not drink alcohol or use illicit drugs.  Family History  Problem Relation Age of Onset  . Heart attack Father 63  . Stroke Father 20  . Coronary artery disease Mother   . Diabetes Mother   . Hypertension Mother   . Stroke Mother   . Diabetes Sister   . Sudden death Maternal Uncle   . Colon cancer Neg Hx   . Dementia Mother     Medications: Patient's Medications  New Prescriptions   No medications on file  Previous Medications   ACETAMINOPHEN (TYLENOL) 500 MG TABLET    Take 500 mg by mouth every 6 (six) hours as needed.   AMITRIPTYLINE (ELAVIL) 25 MG TABLET    Take 1 tablet (25 mg total) by mouth at bedtime.   CARVEDILOL (COREG) 6.25 MG TABLET    Take 1 tablet (6.25 mg total) by mouth 2 (two) times daily with a meal.   CHOLECALCIFEROL (VITAMIN D3) 1000 UNITS TABLET    Take 1,000 Units by mouth 2 (two) times  daily.     CO-ENZYME Q-10 30 MG CAPSULE    Take 100 mg by mouth daily.     DIPYRIDAMOLE-ASPIRIN (AGGRENOX) 200-25 MG PER 12 HR CAPSULE    Take 1 capsule by mouth 2 (two) times daily.   FUROSEMIDE (LASIX) 20 MG TABLET    Take 1 tablet (20 mg total) by mouth daily.   GLUCOSE BLOOD (PRECISION PCX PLUS TEST) TEST STRIP    Dx: 250.00Inject 1 each as directed 2 (two) times daily. Dx: 250.00   INSULIN GLARGINE (LANTUS SOLOSTAR) 100 UNIT/ML SOLOSTAR PEN    Inject 74 Units into the skin daily.   INSULIN PEN NEEDLE (RA PEN NEEDLES) 31G X 5 MM MISC    Use daily with insulin pen   LACOSAMIDE  (VIMPAT) 50 MG TABS TABLET    Take 50 mg by mouth 2 (two) times daily.   LANCETS THIN MISC    Precision Thin, use twice daily for insulin dosing   LISINOPRIL (PRINIVIL,ZESTRIL) 40 MG TABLET    Take 1 tablet (40 mg total) by mouth daily.   METFORMIN (GLUCOPHAGE) 1000 MG TABLET    Take 1 tablet (1,000 mg total) by mouth daily with breakfast.   NIFEDIPINE (PROCARDIA-XL/ADALAT CC) 60 MG 24 HR TABLET    Take 1 tablet (60 mg total) by mouth daily.   PIOGLITAZONE (ACTOS) 45 MG TABLET    Take 0.5 tablets (22.5 mg total) by mouth daily.   SENNA-DOCUSATE (SENOKOT-S) 8.6-50 MG PER TABLET    Take 2 tablets by mouth at bedtime.   SIMVASTATIN (ZOCOR) 20 MG TABLET    Take 1 tablet (20 mg total) by mouth at bedtime.   VENLAFAXINE XR (EFFEXOR-XR) 150 MG 24 HR CAPSULE    Take 1 capsule (150 mg total) by mouth daily.  Modified Medications   No medications on file  Discontinued Medications   HYDROCODONE-ACETAMINOPHEN (NORCO/VICODIN) 5-325 MG PER TABLET    Take 1 tablet by mouth every 8 (eight) hours as needed.     Physical Exam: Filed Vitals:   02/14/15 1325  BP: 157/89  Pulse: 86  Temp: 97 F (36.1 C)  Resp: 18  SpO2: 96%    General- elderly female, obese, in no acute distress Head- normocephalic, atraumatic Throat- moist mucus membrane Eyes- PERRLA, EOMI, no pallor, no icterus, no discharge, normal conjunctiva, normal sclera Neck- no cervical lymphadenopathy Cardiovascular- normal s1,s2, no murmurs, palpable dorsalis pedis and radial pulses, trace leg edema Respiratory- bilateral clear to auscultation, no wheeze, no rhonchi, no crackles, no use of accessory muscles Abdomen- bowel sounds present, soft, non tender, umbilical hernia present Musculoskeletal- able to move all 4 extremities, limited ROM of both shoulder upto 90 degrees, lower extremity weakness left > right  Neurological- no focal deficit Skin- warm and dry Psychiatry- alert and oriented to person and place, normal mood and  affect    Labs reviewed: Basic Metabolic Panel:  Recent Labs  41/32/44 1015 08/29/14 0431 10/06/14 2244  NA 141 141 138  K 4.2 3.8 4.1  CL 106 107 105  CO2 19* 25 27  GLUCOSE 378* 214* 166*  BUN 14 10 26*  CREATININE 0.92 0.77 1.20  CALCIUM 9.3 8.1* 9.1   Liver Function Tests:  Recent Labs  08/28/14 1015 10/06/14 2244  AST 20 13*  ALT 20 19  ALKPHOS 111 76  PROT 8.3* 7.5  ALBUMIN 4.0 3.8   No results for input(s): LIPASE, AMYLASE in the last 8760 hours. No results for input(s): AMMONIA in the last  8760 hours. CBC:  Recent Labs  08/28/14 1015 08/29/14 0431 10/06/14 2244  WBC 12.2* 9.6 8.2  NEUTROABS  --  6.7* 5.4  HGB 14.2 11.6* 12.3  HCT 44.2 36.1 37.9  MCV 94 94 93  PLT 193 148* 178    Assessment/Plan  Uncontrolled hypertension Elevated bp reading in facility. Increase nifedipine to 90 mg daily. Continue coreg 6.25 mg bid and lisinopril 40 mg daily  OA Continue prn tylenol with vitamin d supplement  Old CVA with weakness Continue aggrenox and statin. Continue bp medication. Monitor bp and lipid panel. Fall precautions. Uses wheelchair. Needs hoyer transfer and assistance with bathing and feeding. Skin care with pressure ulcer prophylaxis.  Hyperlipidemia Check lipid panel. Continue zocor 20 mg daily with coenzyme Q  DM type 2 with neurological complication Monitor cbg. Continue lantus 74 u with glucophage 1000 mg daily and actos 45 mg half a tablet. Monitor a1c. Continue statin and lisinopril. Continue amitriptyline for neuropathic pain  Epilepsy Remains seizure free. Continue vimpat 50 mg bid and monitor  Constipation Stable, continue senna s  Depression Stable mood. Continue venlafaxine 150 mg daily  Umbilical hernia No signs of incarceration/ torsion. Monitor clinically  Goals of care: long term care   Labs/tests ordered: cbc, cmp, tsh, a1c, lipid panel, vitamin d  Family/ staff Communication: reviewed care plan with patient and  nursing supervisor    Oneal Grout, MD  [facility] [phone] (Monday-Friday 8 am - 5 pm) [phone] (afterhours)

## 2015-02-15 LAB — BASIC METABOLIC PANEL
BUN: 18 mg/dL (ref 4–21)
Creatinine: 0.8 mg/dL (ref 0.5–1.1)
GLUCOSE: 47 mg/dL
Potassium: 4.3 mmol/L (ref 3.4–5.3)
Sodium: 143 mmol/L (ref 137–147)

## 2015-02-15 LAB — CBC AND DIFFERENTIAL
HEMATOCRIT: 35 % — AB (ref 36–46)
Hemoglobin: 11.1 g/dL — AB (ref 12.0–16.0)
PLATELETS: 158 10*3/uL (ref 150–399)
WBC: 7.6 10*3/mL

## 2015-02-15 LAB — LIPID PANEL
Cholesterol: 101 mg/dL (ref 0–200)
HDL: 27 mg/dL — AB (ref 35–70)
LDL CALC: 48 mg/dL
TRIGLYCERIDES: 129 mg/dL (ref 40–160)

## 2015-02-15 LAB — HEPATIC FUNCTION PANEL
ALK PHOS: 66 U/L (ref 25–125)
ALT: 8 U/L (ref 7–35)
AST: 10 U/L — AB (ref 13–35)
BILIRUBIN, TOTAL: 0.2 mg/dL

## 2015-02-15 LAB — HEMOGLOBIN A1C: HEMOGLOBIN A1C: 6.1

## 2015-03-08 ENCOUNTER — Encounter: Payer: Self-pay | Admitting: Adult Health

## 2015-03-08 NOTE — Progress Notes (Signed)
Patient ID: Yvette Gutierrez, female   DOB: 1945-12-17, 69 y.o.   MRN: [id number]   02/13/15  Facility:  Nursing Home Location:  [facility] Room Number: 204-1 LEVEL OF CARE:  SNF (31)  Chief Complaint  Patient presents with  . New admission follow-up    Seizure, diabetes mellitus type 2, hypertension, depression, neuropathy, hyperlipidemia, constipation, history of CVA and umbilical hernia    HISTORY OF PRESENT ILLNESS:  This is a 69 year old female was been admitted to [facility] on 5/0 216 from [facility] for long-term care. She has PMH of hypertension, diabetes mellitus type 2, old CVA, dementia, OSA, compression fracture of vertebrae, neuropathy and depression.  Patient is alert and able to make needs known. She has no complaints of pain.  PAST MEDICAL HISTORY:  Past Medical History  Diagnosis Date  . Allergic rhinitis   . Depression   . Diabetes mellitus type II   . Neuropathy due to secondary diabetes   . HTN (hypertension)   . Obesity   . CVA (cerebral infarction) 2002    Chronic unsteadiness  . Bilateral shoulder pain     chronic-uses vicodin a few times a month for pain control  . Femur fracture, left 09/2014    nonsurgical treatment, immobilized  . Dementia   . Hyperlipidemia     CURRENT MEDICATIONS: Reviewed per MAR/see medication list  No Known Allergies   REVIEW OF SYSTEMS:  GENERAL: no change in appetite, no fatigue, no weight changes, no fever, chills or weakness RESPIRATORY: no cough, SOB, DOE, wheezing, hemoptysis CARDIAC: no chest pain, edema or palpitations GI: no abdominal pain, diarrhea, constipation, heart burn, nausea or vomiting  PHYSICAL EXAMINATION  GENERAL: no acute distress, obese EYES: conjunctivae normal, sclerae normal, normal eye lids NECK: supple, trachea midline, no neck masses, no thyroid tenderness, no thyromegaly LYMPHATICS: no LAN in the neck, no supraclavicular LAN RESPIRATORY: breathing is  even & unlabored, BS CTAB CARDIAC: RRR, no murmur,no extra heart sounds, no edema GI: abdomen soft, normal BS, no masses, no tenderness, no hepatomegaly, no splenomegaly, has umbilical hernia EXTREMITIES: limited ROM on bilateral shoulders; BLE weakness PSYCHIATRIC: the patient is alert & oriented to person, affect & behavior appropriate  LABS/RADIOLOGY: Labs reviewed: Basic Metabolic Panel:  Recent Labs  30/86/57 1015 08/29/14 0431 10/06/14 2244  NA 141 141 138  K 4.2 3.8 4.1  CL 106 107 105  CO2 19* 25 27  GLUCOSE 378* 214* 166*  BUN 14 10 26*  CREATININE 0.92 0.77 1.20  CALCIUM 9.3 8.1* 9.1   Liver Function Tests:  Recent Labs  08/28/14 1015 10/06/14 2244  AST 20 13*  ALT 20 19  ALKPHOS 111 76  PROT 8.3* 7.5  ALBUMIN 4.0 3.8   CBC:  Recent Labs  08/28/14 1015 08/29/14 0431 10/06/14 2244  WBC 12.2* 9.6 8.2  NEUTROABS  --  6.7* 5.4  HGB 14.2 11.6* 12.3  HCT 44.2 36.1 37.9  MCV 94 94 93  PLT 193 148* 178    ASSESSMENT/PLAN:  Seizure disorder - continue Vimpat 50 mg by mouth every 12 hours Diabetes mellitus, type II - continue metformin 1000 mg by mouth daily, Lantus 74 units subcutaneous daily at bedtime and Pioglitazone Hcl 45 mg take 22.5 mg PO BID Hypertension - continue Lasix 20 mg by mouth daily, Indocin of breath 40 mg by mouth daily, carvedilol 6.25 mg by mouth twice a day and nifedipine ER 60 mg by mouth daily Depression -  mood is stable; continue venlafaxine 150 mg by mouth daily Neuropathy - continue amitriptyline HCL 25 mg by mouth daily at bedtime Hyperlipidemia - continue simvastatin 20 mg by mouth daily at bedtime Constipation - continue senna S2 tabs by mouth daily at bedtime History of CVA - continue Aggrenox 25-200 milligrams 1 by mouth twice a day; continue supportive care Umbilical hernia - no complaints of pain; will monitor    Goals of care:  Long-term care  Spent 50 minutes in patient care.    [facility],  NP [employer] [phone]

## 2015-03-21 LAB — HM DIABETES EYE EXAM

## 2015-03-22 ENCOUNTER — Encounter: Payer: Self-pay | Admitting: *Deleted

## 2015-04-24 ENCOUNTER — Non-Acute Institutional Stay (SKILLED_NURSING_FACILITY): Payer: Medicare Other | Admitting: Adult Health

## 2015-04-24 ENCOUNTER — Encounter: Payer: Self-pay | Admitting: Adult Health

## 2015-04-24 DIAGNOSIS — I639 Cerebral infarction, unspecified: Secondary | ICD-10-CM | POA: Diagnosis not present

## 2015-04-24 DIAGNOSIS — F330 Major depressive disorder, recurrent, mild: Secondary | ICD-10-CM | POA: Diagnosis not present

## 2015-04-24 DIAGNOSIS — E1340 Other specified diabetes mellitus with diabetic neuropathy, unspecified: Secondary | ICD-10-CM

## 2015-04-24 DIAGNOSIS — I10 Essential (primary) hypertension: Secondary | ICD-10-CM | POA: Diagnosis not present

## 2015-04-24 DIAGNOSIS — E1142 Type 2 diabetes mellitus with diabetic polyneuropathy: Secondary | ICD-10-CM

## 2015-04-24 DIAGNOSIS — E785 Hyperlipidemia, unspecified: Secondary | ICD-10-CM

## 2015-04-24 DIAGNOSIS — K429 Umbilical hernia without obstruction or gangrene: Secondary | ICD-10-CM

## 2015-04-24 DIAGNOSIS — K5901 Slow transit constipation: Secondary | ICD-10-CM | POA: Diagnosis not present

## 2015-04-24 DIAGNOSIS — G629 Polyneuropathy, unspecified: Secondary | ICD-10-CM

## 2015-04-24 DIAGNOSIS — G40909 Epilepsy, unspecified, not intractable, without status epilepticus: Secondary | ICD-10-CM

## 2015-04-24 NOTE — Progress Notes (Signed)
Patient ID: Yvette Gutierrez, female   DOB: May 30, 1946, 69 y.o.   MRN: [id number]    04/24/15  Facility:  Nursing Home Location:  [facility] Room Number: 204-1 LEVEL OF CARE:  SNF (31)  Chief Complaint  Patient presents with  . Medical Management of Chronic Illnesses    Seizure, diabetes mellitus type 2, uncontrolled hypertension, depression, neuropathy, hyperlipidemia, constipation, history of CVA and umbilical hernia    HISTORY OF PRESENT ILLNESS:  This is a 69 year old female who is being seen for a routine visit. She has been admitted to [facility] on 02/12/15 from [facility] for long-term care. She has PMH of hypertension, diabetes mellitus type 2, old CVA, dementia, OSA, compression fracture of vertebrae, neuropathy and depression.  Upon BP review, BPs 153/92, 143/84, 137/75 - noted to be elevated. No complaints of dizziness nor headache. No seizure episode. Lantus was recently decreased to 40 units SQ @ HS. Latest hgba1c  6.1.   PAST MEDICAL HISTORY:  Past Medical History  Diagnosis Date  . Allergic rhinitis   . Depression   . Diabetes mellitus type II   . Neuropathy due to secondary diabetes   . HTN (hypertension)   . Obesity   . CVA (cerebral infarction) 2002    Chronic unsteadiness  . Bilateral shoulder pain     chronic-uses vicodin a few times a month for pain control  . Femur fracture, left 09/2014    nonsurgical treatment, immobilized  . Dementia   . Hyperlipidemia     CURRENT MEDICATIONS: Reviewed per MAR/see medication list  No Known Allergies   REVIEW OF SYSTEMS:  GENERAL: no change in appetite, no fatigue, no weight changes, no fever, chills or weakness RESPIRATORY: no cough, SOB, DOE, wheezing, hemoptysis CARDIAC: no chest pain, edema or palpitations GI: no abdominal pain, diarrhea, constipation, heart burn, nausea or vomiting  PHYSICAL EXAMINATION  GENERAL: no acute distress, obese NECK: supple, trachea midline,  no neck masses, no thyroid tenderness, no thyromegaly LYMPHATICS: no LAN in the neck, no supraclavicular LAN RESPIRATORY: breathing is even & unlabored, BS CTAB CARDIAC: RRR, no murmur,no extra heart sounds, no edema GI: abdomen soft, normal BS, no masses, no tenderness, no hepatomegaly, no splenomegaly, has umbilical hernia EXTREMITIES: limited ROM on bilateral shoulders; BLE weakness PSYCHIATRIC: the patient is alert & oriented to person, affect & behavior appropriate  LABS/RADIOLOGY: Labs reviewed: 02/15/15  WBC 7.6 hemoglobin 11.1 hematocrit 35.3 MCV 87.4 platelet 158 sodium 143 potassium 4.3 glucose 47 BUN 18 creatinine 0.80 total bilirubin 0.2 alkaline phosphatase 66 SGOT 10 SGPT 8 total protein 6.3 albumin 3.7 calcium 8.8 cholesterol 101 triglycerides 129 HDL 27 LDL 48 TSH 2.787 hemoglobin A1c 6.1 vitamin D 46 Basic Metabolic Panel:  Recent Labs  11/91/47 1015 08/29/14 0431 10/06/14 2244  NA 141 141 138  K 4.2 3.8 4.1  CL 106 107 105  CO2 19* 25 27  GLUCOSE 378* 214* 166*  BUN 14 10 26*  CREATININE 0.92 0.77 1.20  CALCIUM 9.3 8.1* 9.1   Liver Function Tests:  Recent Labs  08/28/14 1015 10/06/14 2244  AST 20 13*  ALT 20 19  ALKPHOS 111 76  PROT 8.3* 7.5  ALBUMIN 4.0 3.8   CBC:  Recent Labs  08/28/14 1015 08/29/14 0431 10/06/14 2244  WBC 12.2* 9.6 8.2  NEUTROABS  --  6.7* 5.4  HGB 14.2 11.6* 12.3  HCT 44.2 36.1 37.9  MCV 94 94 93  PLT 193 148* 178  ASSESSMENT/PLAN:  Seizure disorder - continue Vimpat 50 mg by mouth every 12 hours  Diabetes mellitus, type II -  hgbA1c 6.1continue metformin 1000 mg by mouth daily, recently decreased Lantus 40 units subcutaneous daily at bedtime and Pioglitazone Hcl 45 mg take 22.5 mg PO daily  Hypertension - continue Lasix 20 mg by mouth daily, Lisinopril 40 mg by mouth daily, increase carvedilol to 12.5 mg by mouth twice a day and nifedipine ER 90 mg by mouth daily  Depression - mood is stable; continue venlafaxine  150 mg by mouth daily  Neuropathy - continue amitriptyline HCL 25 mg by mouth daily at bedtime  Hyperlipidemia - continue simvastatin 20 mg by mouth daily at bedtime  Constipation - continue senna S2 tabs by mouth daily at bedtime  History of CVA - continue Aggrenox 25-200 milligrams 1 by mouth twice a day; continue supportive care  Umbilical hernia - no complaints of pain; will continue to monitor    Goals of care:  Long-term care    [facility], NP [facility] [phone]

## 2015-06-01 ENCOUNTER — Non-Acute Institutional Stay (SKILLED_NURSING_FACILITY): Payer: Medicare Other | Admitting: Adult Health

## 2015-06-01 ENCOUNTER — Encounter: Payer: Self-pay | Admitting: Adult Health

## 2015-06-01 DIAGNOSIS — F330 Major depressive disorder, recurrent, mild: Secondary | ICD-10-CM | POA: Diagnosis not present

## 2015-06-01 DIAGNOSIS — K5901 Slow transit constipation: Secondary | ICD-10-CM

## 2015-06-01 DIAGNOSIS — K429 Umbilical hernia without obstruction or gangrene: Secondary | ICD-10-CM

## 2015-06-01 DIAGNOSIS — I10 Essential (primary) hypertension: Secondary | ICD-10-CM

## 2015-06-01 DIAGNOSIS — G40909 Epilepsy, unspecified, not intractable, without status epilepticus: Secondary | ICD-10-CM | POA: Diagnosis not present

## 2015-06-01 DIAGNOSIS — I639 Cerebral infarction, unspecified: Secondary | ICD-10-CM

## 2015-06-01 DIAGNOSIS — E1142 Type 2 diabetes mellitus with diabetic polyneuropathy: Secondary | ICD-10-CM | POA: Diagnosis not present

## 2015-06-01 DIAGNOSIS — E785 Hyperlipidemia, unspecified: Secondary | ICD-10-CM

## 2015-06-01 DIAGNOSIS — G629 Polyneuropathy, unspecified: Secondary | ICD-10-CM | POA: Diagnosis not present

## 2015-06-01 DIAGNOSIS — E1340 Other specified diabetes mellitus with diabetic neuropathy, unspecified: Secondary | ICD-10-CM

## 2015-09-07 NOTE — Progress Notes (Signed)
Patient ID: Yvette Gutierrez, female   DOB: [birth year], 69 y.o.   MRN: [id number]   DATE:  06/01/15  Facility:  Nursing Home Location:  [facility] Room Number: 204-1 LEVEL OF CARE:  SNF (31)  Chief Complaint  Patient presents with  . Medical Management of Chronic Illnesses    Seizure, diabetes mellitus type 2, uncontrolled hypertension, depression, neuropathy, hyperlipidemia, constipation, history of CVA and umbilical hernia    HISTORY OF PRESENT ILLNESS:  This is a 69 year old female who is being seen for a routine visit. She is long-term care resident. She has PMH of hypertension, diabetes mellitus type 2, old CVA, dementia, OSA, compression fracture of vertebrae, neuropathy and depression. Coreg was recently increased due to elevated BP.  PAST MEDICAL HISTORY:  Past Medical History  Diagnosis Date  . Allergic rhinitis   . Depression   . Diabetes mellitus type II   . Neuropathy due to secondary diabetes   . HTN (hypertension)   . Obesity   . CVA (cerebral infarction) 2002    Chronic unsteadiness  . Bilateral shoulder pain     chronic-uses vicodin a few times a month for pain control  . Femur fracture, left 09/2014    nonsurgical treatment, immobilized  . Dementia   . Hyperlipidemia     CURRENT MEDICATIONS: Reviewed per MAR/see medication list   Medication List       This list is accurate as of: 06/01/15 11:59 PM.  Always use your most recent med list.               acetaminophen 500 MG tablet  Commonly known as:  TYLENOL  Take 500 mg by mouth every 6 (six) hours as needed.     amitriptyline 25 MG tablet  Commonly known as:  ELAVIL  Take 1 tablet (25 mg total) by mouth at bedtime.     carvedilol 12.5 MG tablet  Commonly known as:  COREG  Take 12.5 mg by mouth 2 (two) times daily with a meal.     cholecalciferol 1000 UNITS tablet  Commonly known as:  VITAMIN D  Take 1,000 Units by mouth 2 (two) times daily.     co-enzyme Q-10 30  MG capsule  Take 100 mg by mouth daily.     dipyridamole-aspirin 200-25 MG 12hr capsule  Commonly known as:  AGGRENOX  Take 1 capsule by mouth 2 (two) times daily.     furosemide 20 MG tablet  Commonly known as:  LASIX  Take 1 tablet (20 mg total) by mouth daily.     glucose blood test strip  Commonly known as:  PRECISION PCX PLUS TEST  Dx: 250.00Inject 1 each as directed 2 (two) times daily. Dx: 250.00     insulin glargine 100 UNIT/ML injection  Commonly known as:  LANTUS  Inject 40 Units into the skin at bedtime.     Insulin Pen Needle 31G X 5 MM Misc  Commonly known as:  RA PEN NEEDLES  Use daily with insulin pen     lacosamide 50 MG Tabs tablet  Commonly known as:  VIMPAT  Take 50 mg by mouth 2 (two) times daily.     Lancets Thin Misc  Precision Thin, use twice daily for insulin dosing     lisinopril 40 MG tablet  Commonly known as:  PRINIVIL,ZESTRIL  Take 1 tablet (40 mg total) by mouth daily.     metFORMIN 1000 MG tablet  Commonly known as:  GLUCOPHAGE  Take 1 tablet (1,000 mg total) by mouth daily with breakfast.     NIFEdipine 60 MG 24 hr tablet  Commonly known as:  PROCARDIA-XL/ADALAT CC  Take 1 tablet (60 mg total) by mouth daily.     pioglitazone 45 MG tablet  Commonly known as:  ACTOS  Take 0.5 tablets (22.5 mg total) by mouth daily.     senna-docusate 8.6-50 MG tablet  Commonly known as:  Senokot-S  Take 2 tablets by mouth at bedtime.     simvastatin 20 MG tablet  Commonly known as:  ZOCOR  Take 1 tablet (20 mg total) by mouth at bedtime.     venlafaxine XR 150 MG 24 hr capsule  Commonly known as:  EFFEXOR-XR  Take 1 capsule (150 mg total) by mouth daily.          No Known Allergies   REVIEW OF SYSTEMS:  GENERAL: no change in appetite, no fatigue, no weight changes, no fever, chills or weakness RESPIRATORY: no cough, SOB, DOE, wheezing, hemoptysis CARDIAC: no chest pain, edema or palpitations GI: no abdominal pain, diarrhea,  constipation, heart burn, nausea or vomiting  PHYSICAL EXAMINATION  GENERAL: no acute distress, obese NECK: supple, trachea midline, no neck masses, no thyroid tenderness, no thyromegaly LYMPHATICS: no LAN in the neck, no supraclavicular LAN RESPIRATORY: breathing is even & unlabored, BS CTAB CARDIAC: RRR, no murmur,no extra heart sounds, no edema GI: abdomen soft, normal BS, no masses, no tenderness, no hepatomegaly, no splenomegaly, has umbilical hernia EXTREMITIES: limited ROM on bilateral shoulders; BLE weakness PSYCHIATRIC: the patient is alert & oriented to person, affect & behavior appropriate  LABS/RADIOLOGY: Labs reviewed: 02/15/15  WBC 7.6 hemoglobin 11.1 hematocrit 35.3 MCV 87.4 platelet 158 sodium 143 potassium 4.3 glucose 47 BUN 18 creatinine 0.80 total bilirubin 0.2 alkaline phosphatase 66 SGOT 10 SGPT 8 total protein 6.3 albumin 3.7 calcium 8.8 cholesterol 101 triglycerides 129 HDL 27 LDL 48 TSH 2.787 hemoglobin A1c 6.1 vitamin D 46 Basic Metabolic Panel:  Recent Labs  16/10/96 2244  NA 138  K 4.1  CL 105  CO2 27  GLUCOSE 166*  BUN 26*  CREATININE 1.20  CALCIUM 9.1   Liver Function Tests:  Recent Labs  10/06/14 2244  AST 13*  ALT 19  ALKPHOS 76  BILITOT 0.2  PROT 7.5  ALBUMIN 3.8   CBC:  Recent Labs  10/06/14 2244  WBC 8.2  NEUTROABS 5.4  HGB 12.3  HCT 37.9  MCV 93  PLT 178    ASSESSMENT/PLAN:  Seizure disorder - continue Vimpat 50 mg by mouth every 12 hours  Diabetes mellitus, type II -  hgbA1c 6.1;  continue metformin 1000 mg by mouth daily, recently decreased Lantus 40 units subcutaneous daily at bedtime and Pioglitazone Hcl 45 mg take 22.5 mg PO daily  Hypertension - continue Lasix 20 mg by mouth daily, Lisinopril 40 mg by mouth daily, carvedilol to 12.5 mg by mouth twice a day and nifedipine ER 90 mg by mouth daily  Depression - mood is stable; continue venlafaxine 150 mg by mouth daily  Neuropathy - continue amitriptyline HCL 25 mg  by mouth daily at bedtime  Hyperlipidemia - continue simvastatin 20 mg by mouth daily at bedtime  Constipation - continue senna S2 tabs by mouth daily at bedtime  History of CVA - continue Aggrenox 25-200 milligrams 1 by mouth twice a day; continue supportive care  Umbilical hernia - no complaints of pain; will continue to monitor    Goals  of care:  Long-term care    [facility], NP [facility] [phone]

## 2015-09-20 ENCOUNTER — Other Ambulatory Visit: Payer: Self-pay | Admitting: *Deleted

## 2015-09-20 MED ORDER — LACOSAMIDE 50 MG PO TABS: ORAL_TABLET | ORAL | Status: DC

## 2015-09-20 NOTE — Telephone Encounter (Signed)
[facility] 

## 2015-10-11 ENCOUNTER — Encounter: Payer: Self-pay | Admitting: Adult Health

## 2015-10-11 ENCOUNTER — Non-Acute Institutional Stay (SKILLED_NURSING_FACILITY): Payer: Medicare Other | Admitting: Adult Health

## 2015-10-11 DIAGNOSIS — E1142 Type 2 diabetes mellitus with diabetic polyneuropathy: Secondary | ICD-10-CM | POA: Diagnosis not present

## 2015-10-11 DIAGNOSIS — F330 Major depressive disorder, recurrent, mild: Secondary | ICD-10-CM | POA: Diagnosis not present

## 2015-10-11 DIAGNOSIS — K429 Umbilical hernia without obstruction or gangrene: Secondary | ICD-10-CM | POA: Diagnosis not present

## 2015-10-11 DIAGNOSIS — K5901 Slow transit constipation: Secondary | ICD-10-CM | POA: Diagnosis not present

## 2015-10-11 DIAGNOSIS — Z8673 Personal history of transient ischemic attack (TIA), and cerebral infarction without residual deficits: Secondary | ICD-10-CM | POA: Diagnosis not present

## 2015-10-11 DIAGNOSIS — I10 Essential (primary) hypertension: Secondary | ICD-10-CM | POA: Diagnosis not present

## 2015-10-11 DIAGNOSIS — G40909 Epilepsy, unspecified, not intractable, without status epilepticus: Secondary | ICD-10-CM | POA: Diagnosis not present

## 2015-10-11 DIAGNOSIS — E785 Hyperlipidemia, unspecified: Secondary | ICD-10-CM

## 2015-10-11 NOTE — Progress Notes (Signed)
Patient ID: Yvette Gutierrez, female   DOB: Nov 08, 1945, 69 y.o.   MRN: [id number]   DATE:  10/11/15  Facility:  Nursing Home Location:  [facility] Room Number: 204-1 LEVEL OF CARE:  SNF (31)  Chief Complaint  Patient presents with  . Medical Management of Chronic Illnesses    Seizure, diabetes mellitus type 2, uncontrolled hypertension, depression, neuropathy, hyperlipidemia, constipation, history of CVA and umbilical hernia    HISTORY OF PRESENT ILLNESS:  This is a 69 year old female who is being seen for a routine visit. She is long-term care resident. She has PMH of hypertension, diabetes mellitus type 2, old CVA, dementia, OSA, compression fracture of vertebrae, neuropathy and depression. She has been stable for the past month.  PAST MEDICAL HISTORY:  Past Medical History  Diagnosis Date  . Allergic rhinitis   . Depression   . Diabetes mellitus type II   . Neuropathy due to secondary diabetes (HCC)   . HTN (hypertension)   . Obesity   . CVA (cerebral infarction) 2002    Chronic unsteadiness  . Bilateral shoulder pain     chronic-uses vicodin a few times a month for pain control  . Femur fracture, left (HCC) 09/2014    nonsurgical treatment, immobilized  . Dementia   . Hyperlipidemia     CURRENT MEDICATIONS: Reviewed per MAR/see medication list   Medication List       This list is accurate as of: 10/11/15  8:08 PM.  Always use your most recent med list.               acetaminophen 500 MG tablet  Commonly known as:  TYLENOL  Take 500 mg by mouth every 6 (six) hours as needed.     carvedilol 12.5 MG tablet  Commonly known as:  COREG  Take 12.5 mg by mouth 2 (two) times daily with a meal.     cholecalciferol 1000 units tablet  Commonly known as:  VITAMIN D  Take 1,000 Units by mouth 2 (two) times daily.     co-enzyme Q-10 30 MG capsule  Take 100 mg by mouth daily.     dipyridamole-aspirin 200-25 MG 12hr capsule  Commonly known  as:  AGGRENOX  Take 1 capsule by mouth 2 (two) times daily.     docusate sodium 100 MG capsule  Commonly known as:  COLACE  Take 100 mg by mouth at bedtime. Take 2 capsules=200 mg PO @@ HS     furosemide 20 MG tablet  Commonly known as:  LASIX  Take 1 tablet (20 mg total) by mouth daily.     insulin glargine 100 UNIT/ML injection  Commonly known as:  LANTUS  Inject 40 Units into the skin at bedtime.     lacosamide 50 MG Tabs tablet  Commonly known as:  VIMPAT  Take one tablet by mouth every 12 hours     lisinopril 40 MG tablet  Commonly known as:  PRINIVIL,ZESTRIL  Take 1 tablet (40 mg total) by mouth daily.     metFORMIN 1000 MG tablet  Commonly known as:  GLUCOPHAGE  Take 1 tablet (1,000 mg total) by mouth daily with breakfast.     NIFEdipine 90 MG 24 hr tablet  Commonly known as:  PROCARDIA XL/ADALAT-CC  Take 90 mg by mouth daily.     pioglitazone 45 MG tablet  Commonly known as:  ACTOS  Take 0.5 tablets (22.5 mg total) by mouth daily.  simvastatin 20 MG tablet  Commonly known as:  ZOCOR  Take 1 tablet (20 mg total) by mouth at bedtime.     venlafaxine XR 150 MG 24 hr capsule  Commonly known as:  EFFEXOR-XR  Take 1 capsule (150 mg total) by mouth daily.          No Known Allergies   REVIEW OF SYSTEMS:  GENERAL: no change in appetite, no fatigue, no weight changes, no fever, chills or weakness RESPIRATORY: no cough, SOB, DOE, wheezing, hemoptysis CARDIAC: no chest pain, edema or palpitations GI: no abdominal pain, diarrhea, constipation, heart burn, nausea or vomiting  PHYSICAL EXAMINATION  GENERAL: no acute distress, obese NECK: supple, trachea midline, no neck masses, no thyroid tenderness, no thyromegaly LYMPHATICS: no LAN in the neck, no supraclavicular LAN RESPIRATORY: breathing is even & unlabored, BS CTAB CARDIAC: RRR, no murmur,no extra heart sounds, no edema GI: abdomen soft, normal BS, no masses, no tenderness, no hepatomegaly, no  splenomegaly, has umbilical hernia EXTREMITIES: limited ROM on bilateral shoulders; BLE weakness PSYCHIATRIC: the patient is alert & oriented to person, affect & behavior appropriate  LABS/RADIOLOGY: Labs reviewed: 02/15/15  WBC 7.6 hemoglobin 11.1 hematocrit 35.3 MCV 87.4 platelet 158 sodium 143 potassium 4.3 glucose 47 BUN 18 creatinine 0.80 total bilirubin 0.2 alkaline phosphatase 66 SGOT 10 SGPT 8 total protein 6.3 albumin 3.7 calcium 8.8 cholesterol 101 triglycerides 129 HDL 27 LDL 48 TSH 2.787 hemoglobin A1c 6.1 vitamin D 46  ASSESSMENT/PLAN:  Seizure disorder - continue Vimpat 50 mg by mouth every 12 hours  Diabetes mellitus, type II -   continue metformin 1000 mg by mouth daily, recently decreased Lantus 40 units subcutaneous daily at bedtime and Pioglitazone Hcl 45 mg take 22.5 mg PO daily; check CMP and hgbA1c  Hypertension - continue Lasix 20 mg by mouth daily, Lisinopril 40 mg by mouth daily, carvedilol to 12.5 mg by mouth twice a day and nifedipine ER 90 mg by mouth daily  Depression - mood is stable; continue venlafaxine 150 mg by mouth daily  Hyperlipidemia - continue simvastatin 20 mg by mouth daily at bedtime; check lipid panel  Constipation - continue Colace 100 mg take 2 capsules = 200 mg @ HS  History of CVA - continue Aggrenox 25-200 milligrams 1 by mouth twice a day; continue supportive care; check CBC  Umbilical hernia - no complaints of pain; will continue to monitor    Goals of care:  Long-term care    [facility], NP [facility] [phone]

## 2015-10-12 LAB — LIPID PANEL
Cholesterol: 132 mg/dL (ref 0–200)
HDL: 30 mg/dL — AB (ref 35–70)
LDL CALC: 47 mg/dL
Triglycerides: 272 mg/dL — AB (ref 40–160)

## 2015-10-12 LAB — CBC AND DIFFERENTIAL
HEMATOCRIT: 38 % (ref 36–46)
HEMOGLOBIN: 11.7 g/dL — AB (ref 12.0–16.0)
PLATELETS: 161 10*3/uL (ref 150–399)
WBC: 7.5 10*3/mL

## 2015-10-12 LAB — BASIC METABOLIC PANEL
BUN: 20 mg/dL (ref 4–21)
Creatinine: 0.7 mg/dL (ref 0.5–1.1)
GLUCOSE: 105 mg/dL
Potassium: 4.1 mmol/L (ref 3.4–5.3)
Sodium: 142 mmol/L (ref 137–147)

## 2015-10-12 LAB — HEMOGLOBIN A1C: Hemoglobin A1C: 8.1

## 2015-10-12 LAB — HEPATIC FUNCTION PANEL
ALK PHOS: 69 U/L (ref 25–125)
ALT: 6 U/L — AB (ref 7–35)
AST: 10 U/L — AB (ref 13–35)
BILIRUBIN, TOTAL: 0.3 mg/dL

## 2015-10-12 IMAGING — CT CT HEAD WITHOUT CONTRAST
4 series · 16 of 37 positions shown, 18 images · non-contrast
Comparison: None.

CLINICAL DATA: Found on floor, unwitnessed fall. Pain. Altered
mental status.

EXAM:
CT HEAD WITHOUT CONTRAST
CT CERVICAL SPINE WITHOUT CONTRAST
TECHNIQUE: Multidetector CT imaging of the head and cervical spine was
performed following the standard protocol without intravenous
contrast. Multiplanar CT image reconstructions of the cervical spine
were also generated.

[Series 2: head wo · axial · 0.40mm/px · z∈[-104,-5]mm · 6 of 32 slices shown, 8 images (1 of 2)]
[im 5/32  brain]
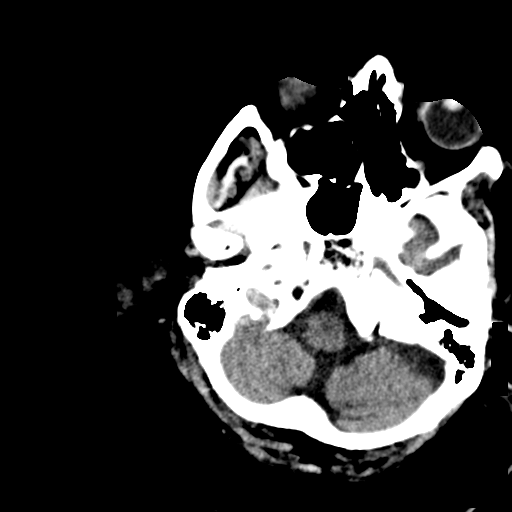
[im 5/32  bone]
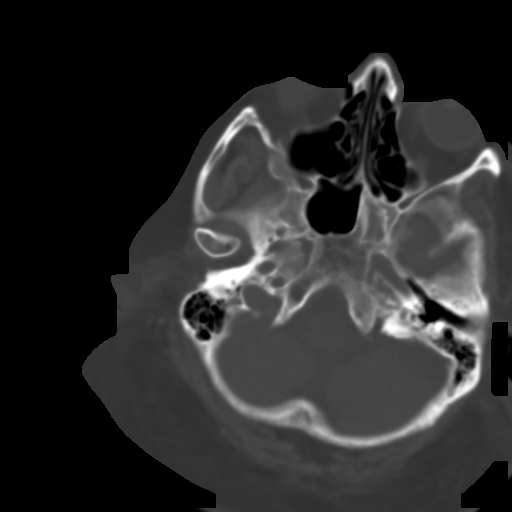
[im 9/32  brain]
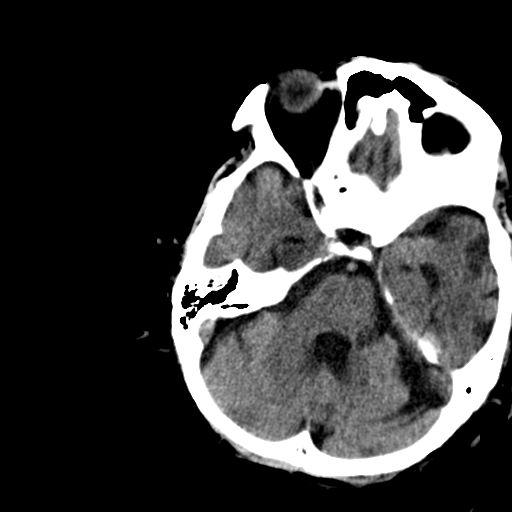
[im 14/32  brain]
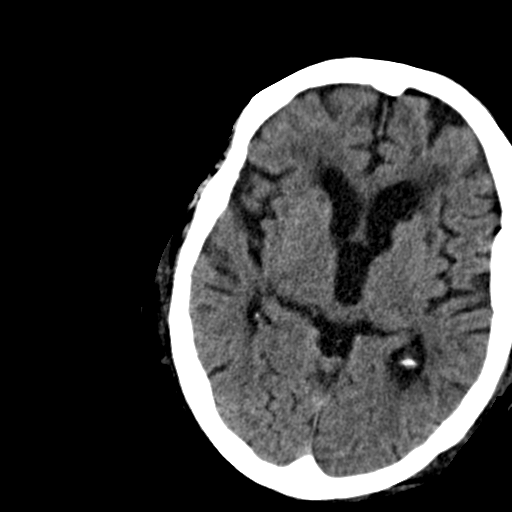
[im 18/32  brain]
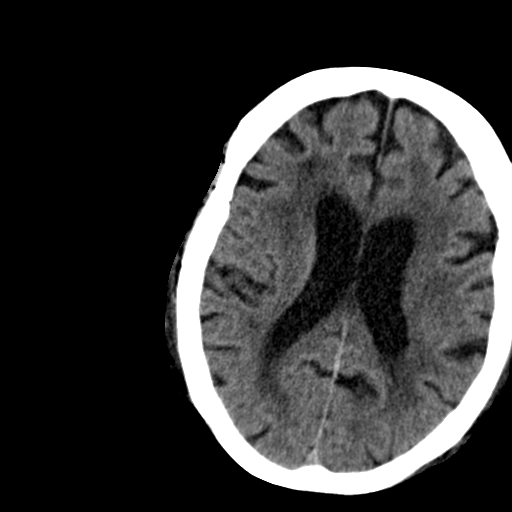
[im 23/32  brain]
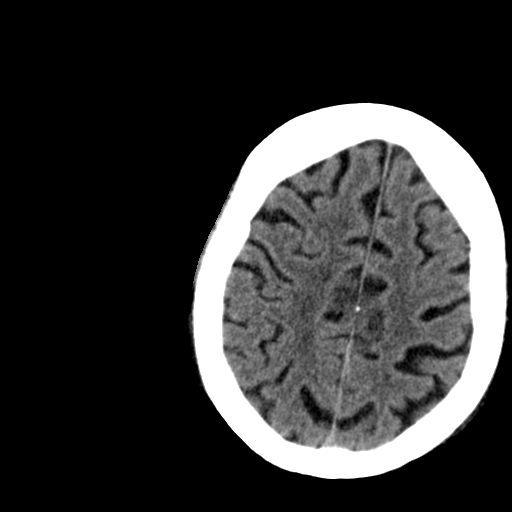
[im 23/32  bone]
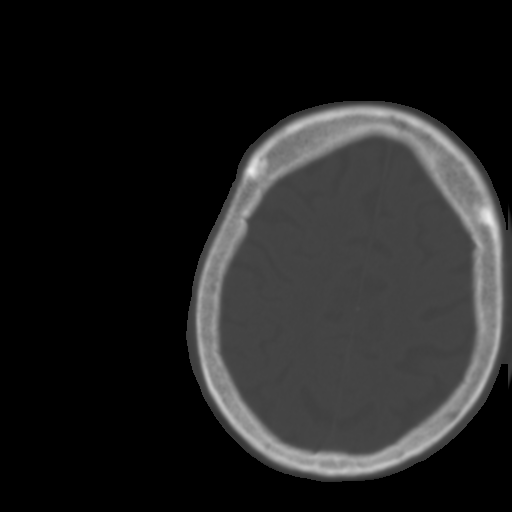
[im 27/32  brain]
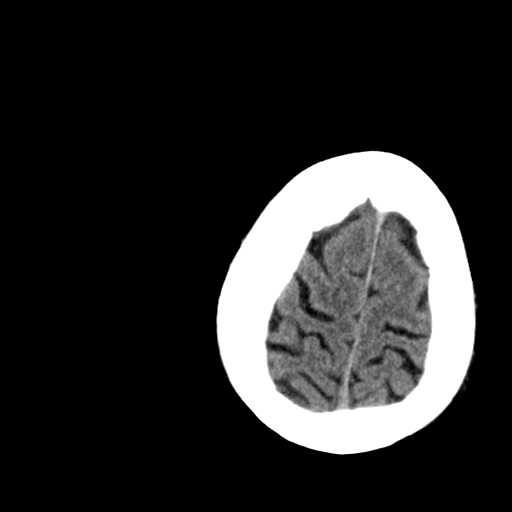

[Series 3: head wo · axial · 0.40mm/px · z∈[-100,-10]mm · 5 of 32 slices shown (2 of 2)]
[im 6/32  brain]
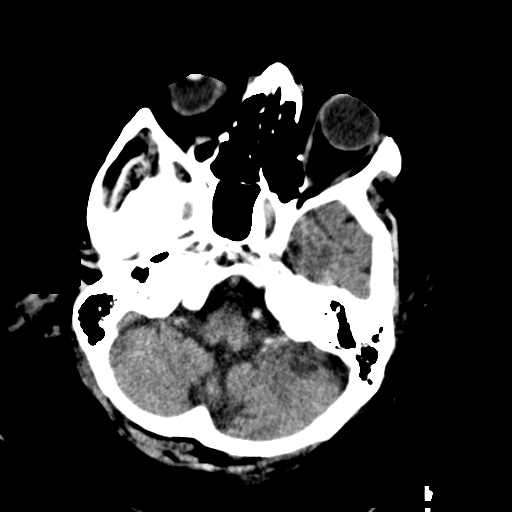
[im 11/32  brain]
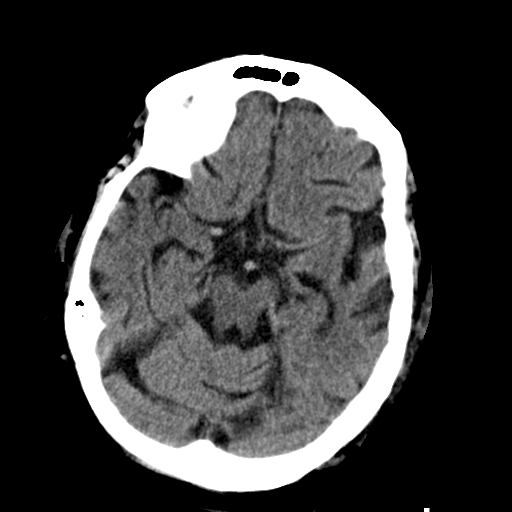
[im 16/32  brain]
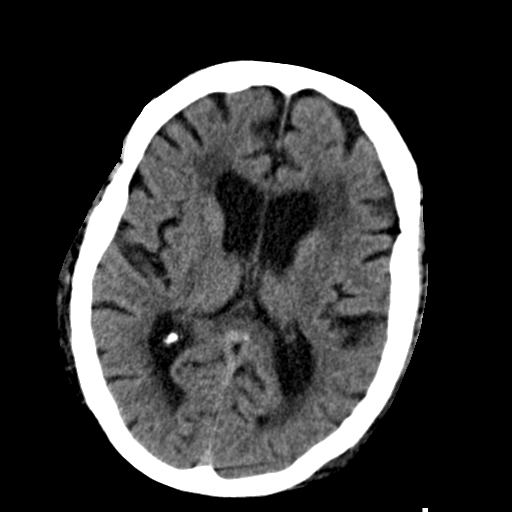
[im 21/32  brain]
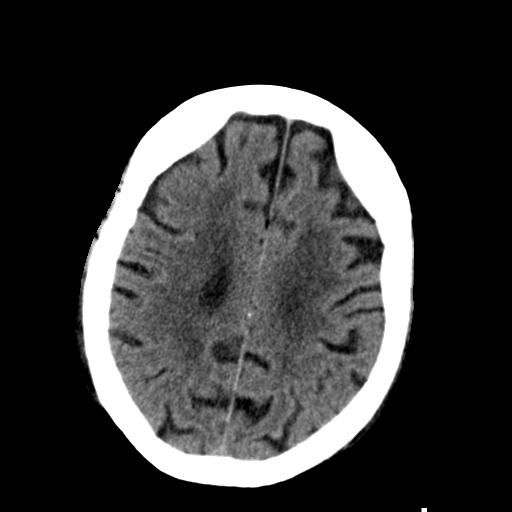
[im 26/32  brain]
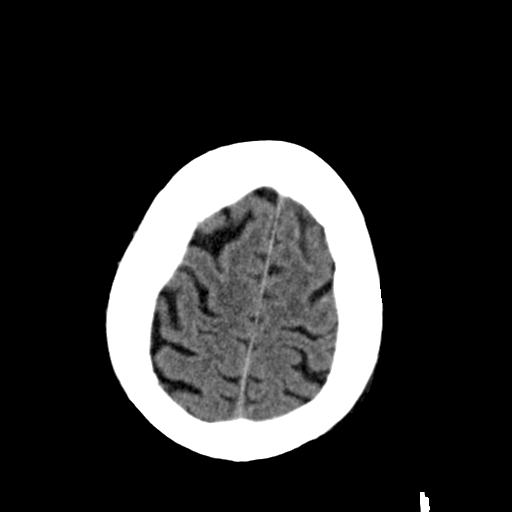

[Series 6: c spine soft · axial · 0.38mm/px · z∈[-234,-214]mm · 2 of 68 slices shown]
[im 5/68  brain]
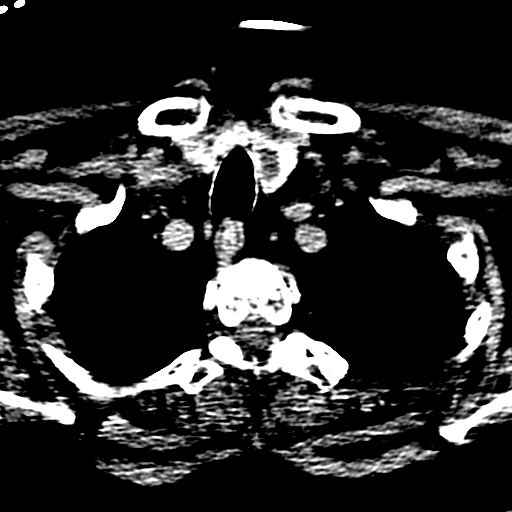
[im 15/68  brain]
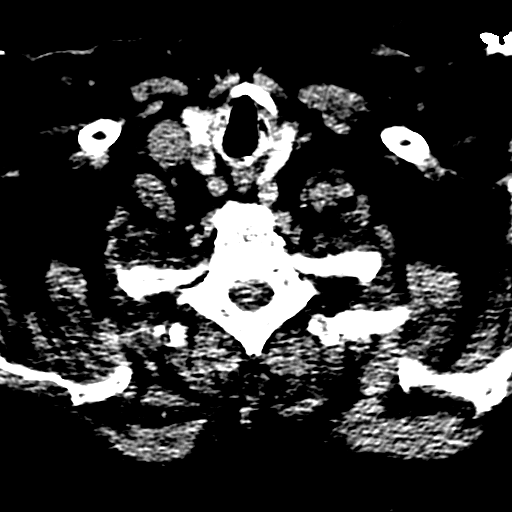

[Series 7: sag bone · sagittal · 0.31mm/px · 3 of 99 slices shown]
[im 33/99  brain]
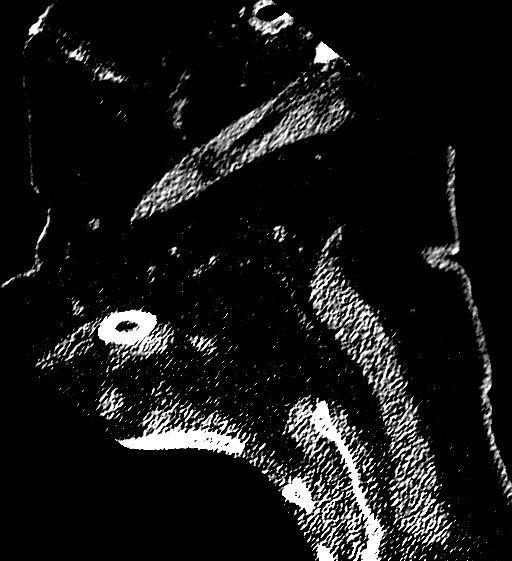
[im 50/99  brain]
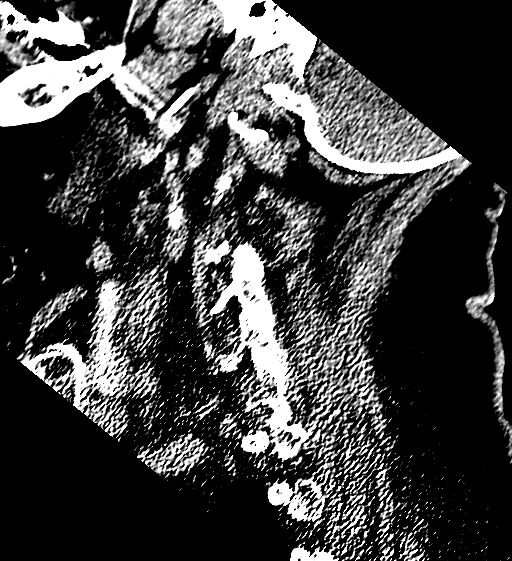
[im 66/99  brain]
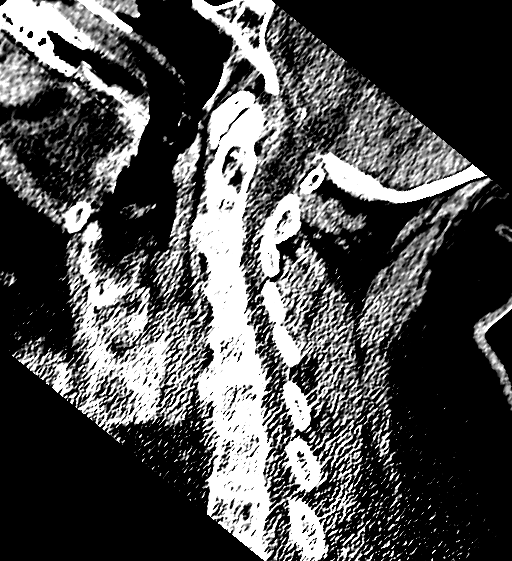

[16 of 37 positions shown; findings below may reference images not displayed]

FINDINGS: CT HEAD FINDINGS

There are scattered small foci of low attenuation in the caudate
nuclei, basal ganglia and thalami. No additional evidence of an
acute infarct, acute hemorrhage, mass lesion, mass effect or
hydrocephalus. Atrophy with confluent low-attenuation in the
periventricular deep white matter. Near-complete opacification of
the left sphenoid sinus. No air-fluid levels in the visualized
portions of the paranasal sinuses or mastoid air cells.

CT CERVICAL SPINE FINDINGS

There is slight reversal of the normal cervical lordosis. Alignment
is otherwise anatomic. No fracture. Endplate degenerative changes,
loss of disc space height and uncovertebral hypertrophy are worst at
C5-6.

Moderate to severe left neural foraminal narrowing at C2-3. Mild
neural foraminal narrowing on the left at C4-5.

Low-attenuation nodules in the thyroid measured 1.5 cm on the left.
Visualized lung apices are clear.
IMPRESSION: 1. No evidence of acute trauma. Scattered low densities in the
caudate nuclei, basal ganglia and thalamus may represent lacunar
infarcts, age indeterminate.
2. Reversal of the normal cervical lordosis without subluxation or
fracture.
3. Atrophy and chronic microvascular white matter ischemic changes.
4. Spondylosis, worst at C5-6.
5. Low-attenuation thyroid nodules. Consider further evaluation with
thyroid ultrasound. If patient is clinically hyperthyroid, consider
nuclear medicine thyroid uptake and scan.

## 2015-10-12 IMAGING — CR DG LUMBAR SPINE 2-3V
1 series · 3 of 3 positions shown · non-contrast
Comparison: None.

CLINICAL DATA: Unwitnessed fall.  Severe pain

EXAM:
LUMBAR SPINE - 2-3 VIEW

[Series 1: dxr lumbar spine ap and lateral · 0.14mm/px · 3 of 3 slices shown]
[im 1/3]
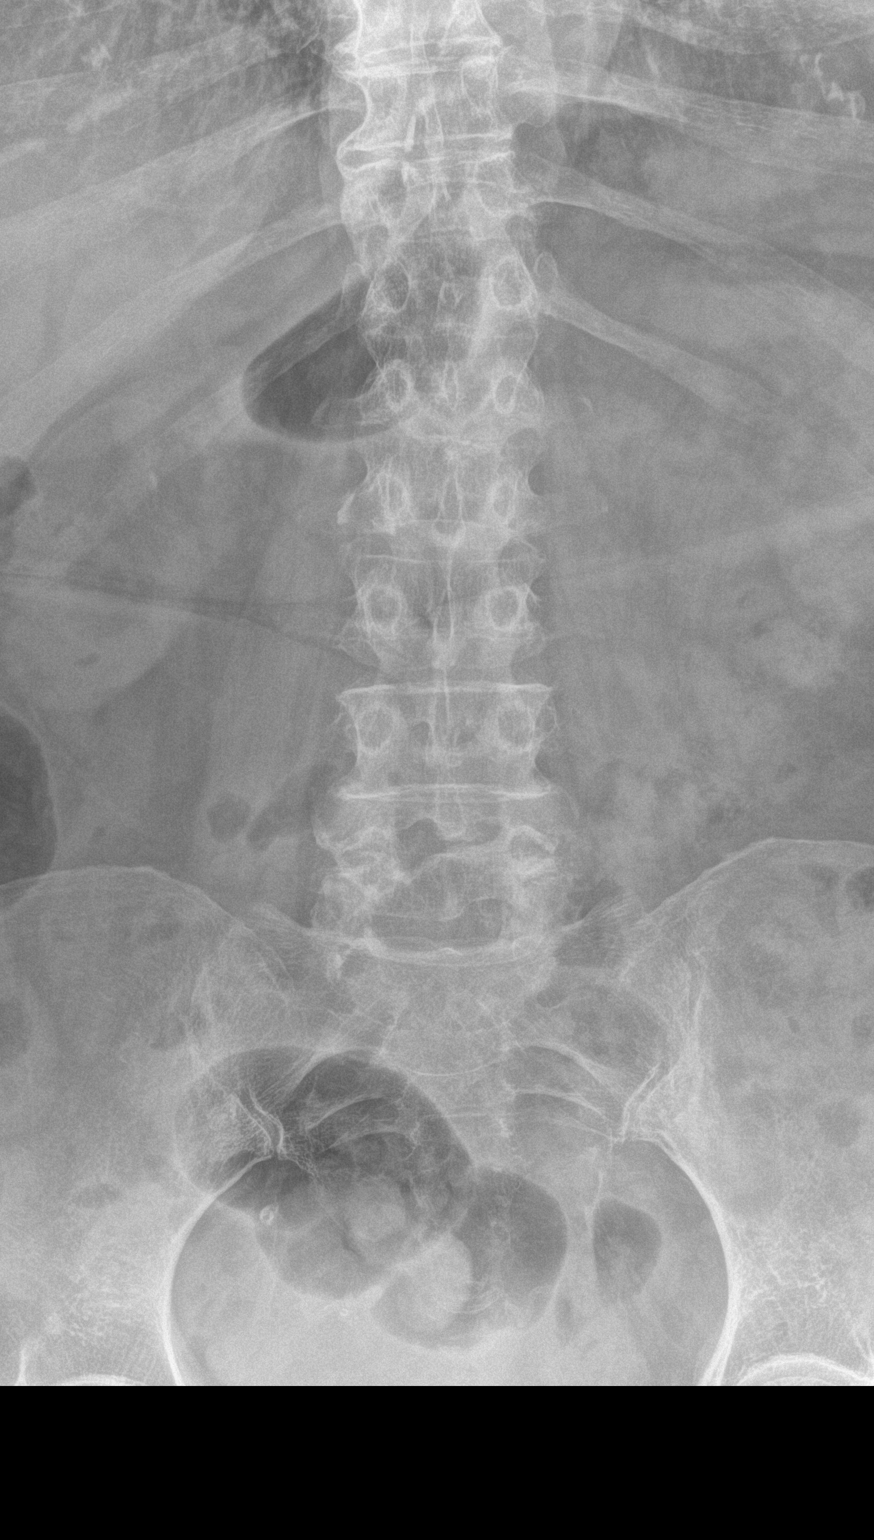
[im 2/3]
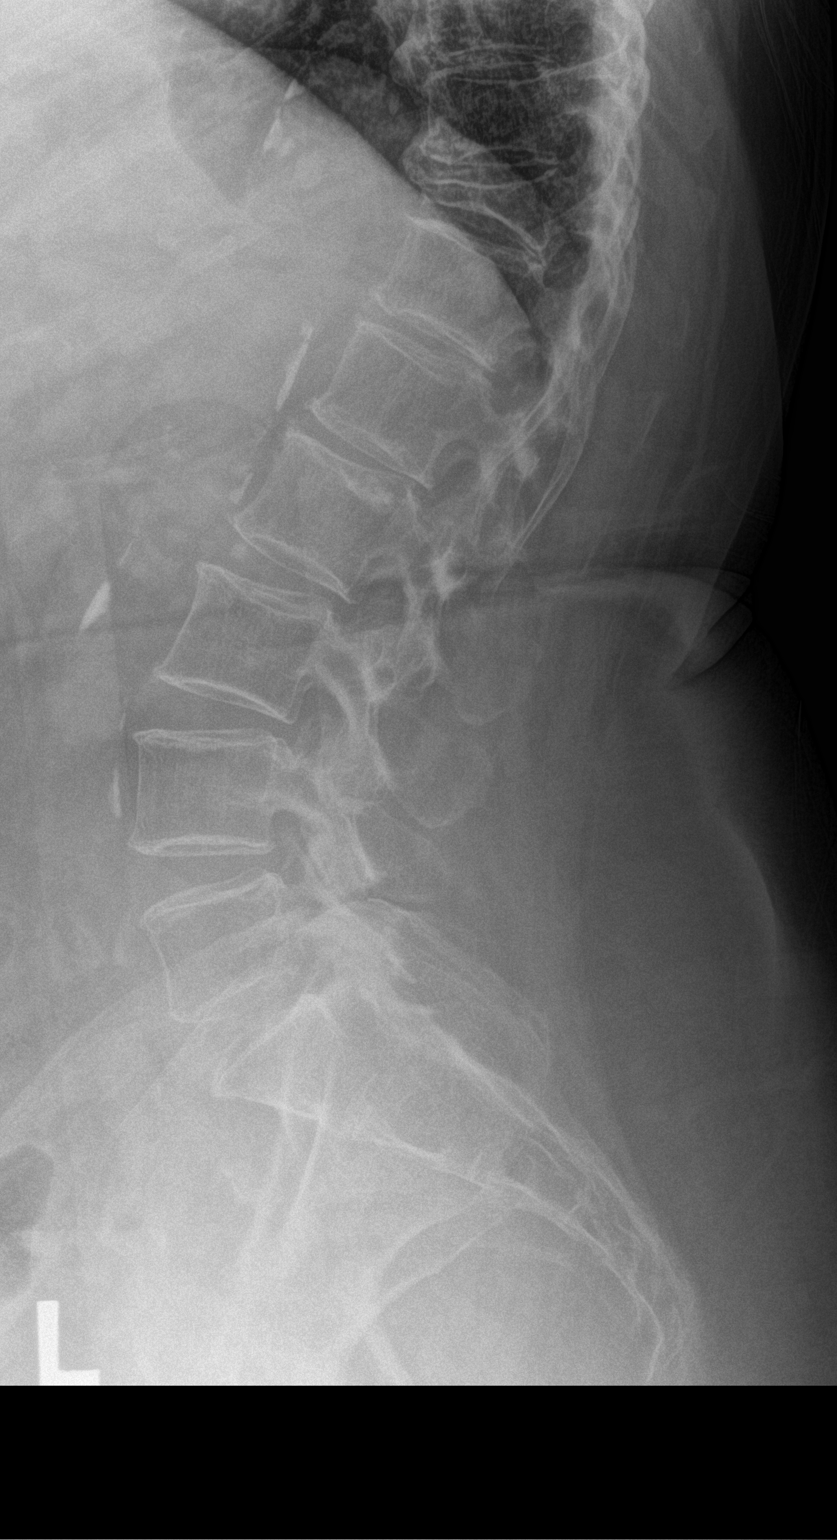
[im 3/3]
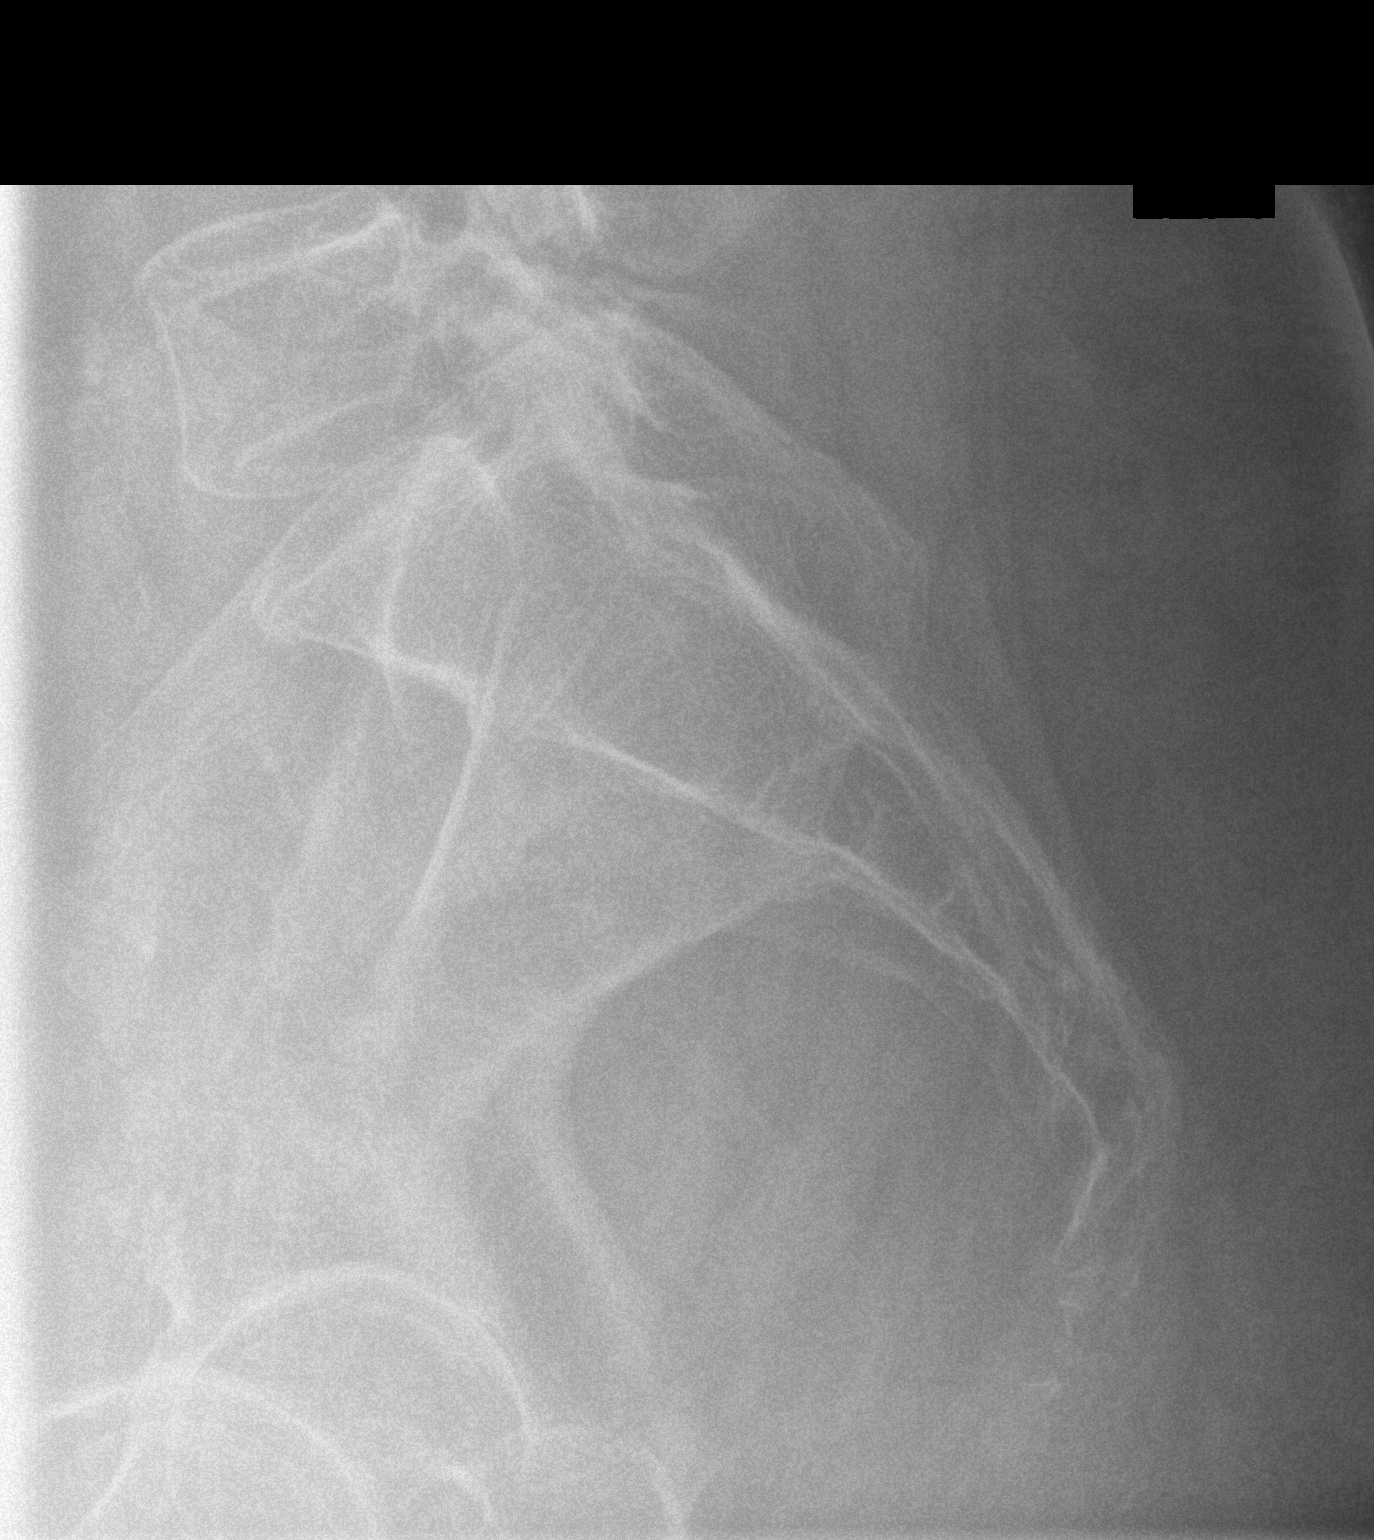

[3 of 3 positions shown; findings below may reference images not displayed]

FINDINGS: The lumbar spine alignment appears normal. The lumbar vertebral body
heights are well preserved. There is a chronic fracture deformity
involving the T11 vertebra. Mild multi level disc space narrowing
and ventral endplate spurring noted. Calcified atherosclerotic
disease involves the abdominal aorta.
IMPRESSION: 1. No acute findings.
2. Chronic T11 compression fracture.
3. Atherosclerosis.

## 2015-10-23 ENCOUNTER — Other Ambulatory Visit: Payer: Self-pay | Admitting: *Deleted

## 2015-10-23 MED ORDER — LACOSAMIDE 50 MG PO TABS: ORAL_TABLET | ORAL | Status: DC

## 2015-10-23 NOTE — Telephone Encounter (Signed)
[facility] 

## 2015-10-29 ENCOUNTER — Non-Acute Institutional Stay (SKILLED_NURSING_FACILITY): Payer: Medicare Other | Admitting: Internal Medicine

## 2015-10-29 ENCOUNTER — Encounter: Payer: Self-pay | Admitting: Internal Medicine

## 2015-10-29 DIAGNOSIS — F330 Major depressive disorder, recurrent, mild: Secondary | ICD-10-CM

## 2015-10-29 DIAGNOSIS — N183 Chronic kidney disease, stage 3 (moderate): Secondary | ICD-10-CM

## 2015-10-29 DIAGNOSIS — E118 Type 2 diabetes mellitus with unspecified complications: Secondary | ICD-10-CM | POA: Diagnosis not present

## 2015-10-29 DIAGNOSIS — Z794 Long term (current) use of insulin: Secondary | ICD-10-CM

## 2015-10-29 DIAGNOSIS — I10 Essential (primary) hypertension: Secondary | ICD-10-CM | POA: Diagnosis not present

## 2015-10-29 DIAGNOSIS — E1122 Type 2 diabetes mellitus with diabetic chronic kidney disease: Secondary | ICD-10-CM | POA: Insufficient documentation

## 2015-10-29 DIAGNOSIS — E785 Hyperlipidemia, unspecified: Secondary | ICD-10-CM

## 2015-10-29 DIAGNOSIS — I639 Cerebral infarction, unspecified: Secondary | ICD-10-CM | POA: Diagnosis not present

## 2015-10-29 NOTE — Progress Notes (Signed)
Patient ID: Yvette Gutierrez, female   DOB: 1946-03-15, 70 y.o.   MRN: [id number]     [facility]   PCP: Crawford Givens, MD  Code Status: full code  No Known Allergies  Chief Complaint  Patient presents with  . Medical Management of Chronic Issues    Routine visit     HPI:  70 year old patient is seen for routine visit. Per staff, she gets angry and mean with staff and refuses care and hygiene several times and cusses them out. When seen in her room, she is pleasant and participates in conversation. She denies any concerns to me. She has PMH of HTN, type 2 DM, constipation, old CVA, dementia, compression fracture of vertebrae, OSA, neuropathy, depression, OA among others. No skin concerns. No fall in the facility.   Review of Systems:  Constitutional: Negative for fever, chills, malaise/fatigue and diaphoresis.  HENT: Negative for headache, congestion, nasal discharge, hearing loss, earache, sore throat, difficulty swallowing.   Eyes: Negative for eye pain, blurred vision, double vision and discharge.  Respiratory: Negative for cough, shortness of breath and wheezing.   Cardiovascular: Negative for chest pain, palpitations, leg swelling.  Gastrointestinal: Negative for heartburn, nausea, vomiting, abdominal pain, constipation. appetite is good. Genitourinary: Negative for dysuria, flank pain.  Musculoskeletal: Negative for falls.  Skin: Negative for itching, rash.  Neurological: Negative for dizziness, tingling. Has lower extremity weakness Psychiatric/Behavioral: Negative for memory loss. Has history of depression   Past Medical History  Diagnosis Date  . Allergic rhinitis   . Depression   . Diabetes mellitus type II   . Neuropathy due to secondary diabetes (HCC)   . HTN (hypertension)   . Obesity   . CVA (cerebral infarction) 2002    Chronic unsteadiness  . Bilateral shoulder pain     chronic-uses vicodin a few times a month for pain  control  . Femur fracture, left (HCC) 09/2014    nonsurgical treatment, immobilized  . Dementia   . Hyperlipidemia     Medications: Patient's Medications  New Prescriptions   No medications on file  Previous Medications   ACETAMINOPHEN (TYLENOL) 500 MG TABLET    Take 500 mg by mouth every 6 (six) hours as needed.   CARVEDILOL (COREG) 12.5 MG TABLET    Take 12.5 mg by mouth 2 (two) times daily with a meal.   CHOLECALCIFEROL (VITAMIN D) 1000 UNITS TABLET    Take 1,000 Units by mouth 2 (two) times daily.   CO-ENZYME Q10 100 MG CAPS    Take 100 mg by mouth daily.   DIPYRIDAMOLE-ASPIRIN (AGGRENOX) 200-25 MG PER 12 HR CAPSULE    Take 1 capsule by mouth 2 (two) times daily.   DOCUSATE SODIUM (COLACE) 100 MG CAPSULE    Take 100 mg by mouth at bedtime. Take 2 capsules=200 mg PO @@ HS   FUROSEMIDE (LASIX) 20 MG TABLET    Take 1 tablet (20 mg total) by mouth daily.   INSULIN GLARGINE (LANTUS) 100 UNIT/ML INJECTION    Inject 40 Units into the skin at bedtime. Do not mix with other insulins.   LACOSAMIDE (VIMPAT) 50 MG TABS TABLET    Take one tablet by mouth every 12 hours   LISINOPRIL (PRINIVIL,ZESTRIL) 40 MG TABLET    Take 1 tablet (40 mg total) by mouth daily.   METFORMIN (GLUCOPHAGE) 1000 MG TABLET    Take 1 tablet (1,000 mg total) by mouth daily with breakfast.   NIFEDIPINE (PROCARDIA XL/ADALAT-CC)  90 MG 24 HR TABLET    Take 90 mg by mouth daily.   PIOGLITAZONE (ACTOS) 45 MG TABLET    Take 0.5 tablets (22.5 mg total) by mouth daily.   SIMVASTATIN (ZOCOR) 20 MG TABLET    Take 1 tablet (20 mg total) by mouth at bedtime.   VENLAFAXINE XR (EFFEXOR-XR) 150 MG 24 HR CAPSULE    Take 1 capsule (150 mg total) by mouth daily.  Modified Medications   No medications on file  Discontinued Medications   CHOLECALCIFEROL (VITAMIN D3) 1000 UNITS TABLET    Take 1,000 Units by mouth 2 (two) times daily.     CO-ENZYME Q-10 30 MG CAPSULE    Take 100 mg by mouth daily.       Physical Exam: Filed Vitals:    10/29/15 1350  BP: 159/72  Pulse: 85  Temp: 99.3 F (37.4 C)  TempSrc: Oral  Resp: 16  Height: 5' (1.524 m)  Weight: 198 lb 3 oz (89.897 kg)  SpO2: 92%    General- elderly female, obese, in no acute distress Head- normocephalic, atraumatic Throat- moist mucus membrane Eyes- PERRLA, EOMI, no pallor, no icterus, no discharge, normal conjunctiva, normal sclera Neck- no cervical lymphadenopathy Cardiovascular- normal s1,s2, no murmurs, palpable dorsalis pedis and radial pulses, trace leg edema Respiratory- bilateral clear to auscultation, no wheeze, no rhonchi, no crackles, no use of accessory muscles Abdomen- bowel sounds present, soft, non tender, umbilical hernia present Musculoskeletal- able to move all 4 extremities, limited ROM of both shoulder upto 90 degrees, lower extremity weakness left > right  Neurological- no focal deficit Skin- warm and dry Psychiatry- alert and oriented to person, time and place, normal mood and affect    Labs reviewed: Basic Metabolic Panel:  Recent Labs  29/56/21  NA 143  K 4.3  BUN 18  CREATININE 0.8   Liver Function Tests:  Recent Labs  02/15/15  AST 10*  ALT 8  ALKPHOS 66   No results for input(s): LIPASE, AMYLASE in the last 8760 hours. No results for input(s): AMMONIA in the last 8760 hours. CBC:  Recent Labs  02/15/15  WBC 7.6  HGB 11.1*  HCT 35*  PLT 158   Lab Results  Component Value Date   HGBA1C 6.1 02/15/2015   Lab Results  Component Value Date   TSH 1.20 08/28/2014   02/15/15 tsh 2.787, vitamin d 46  Assessment/Plan  HTN Elevated BP. Currently on coreg 12.5 mg bid, lisinopril 40 mg daily, lasix 20 mg daily and nifedipine 90 mg daily. Start hctz 25 mg daily for now and monitor. Check bp bid for a week. Check cmp  DM Reviewed a1c from 5/16. Reviewed cbg, no hypoglycemia. Check a1c. Continue lantus 40 u daily for now with metformin 1000 mg daily and actos 22.5 mg daily. Continue statin  HLD Lipid Panel      Component Value Date/Time   CHOL 101 02/15/2015   CHOL 110 08/29/2014 0431   TRIG 129 02/15/2015   TRIG 215* 08/29/2014 0431   HDL 27* 02/15/2015   HDL 38* 08/29/2014 0431   CHOLHDL 3 10/25/2013 1122   VLDL 43* 08/29/2014 0431   VLDL 29.4 10/25/2013 1122   LDLCALC 48 02/15/2015   LDLCALC 29 08/29/2014 0431   LDLDIRECT 52.3 01/04/2010 0914  LDL at goal on review from May. Continue zocor 20 mg daily and check lipid panel  Old CVA  Continue aggrenox and statin. Continue bp medication and statin. Needs hoyer transfer and assistance with  bathing and feeding. Skin care with pressure ulcer prophylaxis. Remains seizure free. Continue vimpat 50 mg bid for her seizure  Chronic Depression Stable mood today. Per staff have behavioral issue. Continue venlafaxine 150 mg daily and get psych consult   Labs/tests ordered: cmp, a1c, lipid panel, cbc  Family/ staff Communication: reviewed care plan with patient and nursing supervisor    Oneal Grout, MD  [facility] [phone] (Monday-Friday 8 am - 5 pm) [phone] (afterhours)

## 2015-10-30 LAB — BASIC METABOLIC PANEL
BUN: 22 mg/dL — AB (ref 4–21)
Creatinine: 0.8 mg/dL (ref 0.5–1.1)
Glucose: 196 mg/dL
Potassium: 4.1 mmol/L (ref 3.4–5.3)
SODIUM: 139 mmol/L (ref 137–147)

## 2015-10-30 LAB — HEPATIC FUNCTION PANEL
ALK PHOS: 72 U/L (ref 25–125)
ALT: 5 U/L — AB (ref 7–35)
AST: 8 U/L — AB (ref 13–35)
BILIRUBIN, TOTAL: 0.2 mg/dL

## 2015-10-30 LAB — CBC AND DIFFERENTIAL
HEMATOCRIT: 38 % (ref 36–46)
Hemoglobin: 11.8 g/dL — AB (ref 12.0–16.0)
NEUTROS ABS: 4 /uL
Platelets: 163 10*3/uL (ref 150–399)
WBC: 7.2 10^3/mL

## 2015-10-30 LAB — HEMOGLOBIN A1C: Hemoglobin A1C: 8.6

## 2015-11-19 ENCOUNTER — Encounter: Payer: Self-pay | Admitting: Adult Health

## 2015-11-19 ENCOUNTER — Non-Acute Institutional Stay (SKILLED_NURSING_FACILITY): Payer: Medicare Other | Admitting: Adult Health

## 2015-11-19 DIAGNOSIS — I10 Essential (primary) hypertension: Secondary | ICD-10-CM

## 2015-11-19 DIAGNOSIS — G40909 Epilepsy, unspecified, not intractable, without status epilepticus: Secondary | ICD-10-CM | POA: Diagnosis not present

## 2015-11-19 DIAGNOSIS — F330 Major depressive disorder, recurrent, mild: Secondary | ICD-10-CM | POA: Diagnosis not present

## 2015-11-19 DIAGNOSIS — Z8673 Personal history of transient ischemic attack (TIA), and cerebral infarction without residual deficits: Secondary | ICD-10-CM | POA: Diagnosis not present

## 2015-11-19 DIAGNOSIS — E559 Vitamin D deficiency, unspecified: Secondary | ICD-10-CM

## 2015-11-19 DIAGNOSIS — E118 Type 2 diabetes mellitus with unspecified complications: Secondary | ICD-10-CM

## 2015-11-19 DIAGNOSIS — K5901 Slow transit constipation: Secondary | ICD-10-CM | POA: Diagnosis not present

## 2015-11-19 DIAGNOSIS — E785 Hyperlipidemia, unspecified: Secondary | ICD-10-CM | POA: Diagnosis not present

## 2015-11-19 NOTE — Progress Notes (Addendum)
Patient ID: Yvette Gutierrez, female   DOB: 23-Jan-1946, 70 y.o.   MRN: [id number]   DATE:  11/19/15  Facility:  Nursing Home Location:  [facility] Room Number: 204-1 LEVEL OF CARE:  SNF (31)  Chief Complaint  Patient presents with  . Medical Management of Chronic Illnesses    Seizure, diabetes mellitus type 2, hypertension, depression, hyperlipidemia, constipation, history of CVA and vitamin D deficiency    HISTORY OF PRESENT ILLNESS:  This is a 70 year old female who is being seen for a routine visit. She is long-term care resident. She has PMH of hypertension, diabetes mellitus type 2, old CVA, dementia, OSA, compression fracture of vertebrae, neuropathy and depression. Her Effexor was recently increased to 225 mg. Her mood is stable.  PAST MEDICAL HISTORY:  Past Medical History  Diagnosis Date  . Allergic rhinitis   . Depression   . Diabetes mellitus type II   . Neuropathy due to secondary diabetes (HCC)   . HTN (hypertension)   . Obesity   . CVA (cerebral infarction) 2002    Chronic unsteadiness  . Bilateral shoulder pain     chronic-uses vicodin a few times a month for pain control  . Femur fracture, left (HCC) 09/2014    nonsurgical treatment, immobilized  . Dementia   . Hyperlipidemia     CURRENT MEDICATIONS: Reviewed per MAR/see medication list   Medication List       This list is accurate as of: 11/19/15 11:59 PM.  Always use your most recent med list.               acetaminophen 500 MG tablet  Commonly known as:  TYLENOL  Take 500 mg by mouth every 6 (six) hours as needed.     carvedilol 12.5 MG tablet  Commonly known as:  COREG  Take 12.5 mg by mouth 2 (two) times daily with a meal.     cholecalciferol 1000 units tablet  Commonly known as:  VITAMIN D  Take 1,000 Units by mouth 2 (two) times daily.     Co-Enzyme Q10 100 MG Caps  Take 100 mg by mouth daily.     dipyridamole-aspirin 200-25 MG 12hr capsule  Commonly known  as:  AGGRENOX  Take 1 capsule by mouth 2 (two) times daily.     docusate sodium 100 MG capsule  Commonly known as:  COLACE  Take 100 mg by mouth at bedtime. Take 2 capsules=200 mg PO @@ HS     furosemide 20 MG tablet  Commonly known as:  LASIX  Take 1 tablet (20 mg total) by mouth daily.     hydrochlorothiazide 25 MG tablet  Commonly known as:  HYDRODIURIL  Take 25 mg by mouth daily. Hold for SBP <110     insulin glargine 100 UNIT/ML injection  Commonly known as:  LANTUS  Inject 40 Units into the skin at bedtime. Do not mix with other insulins.     lacosamide 50 MG Tabs tablet  Commonly known as:  VIMPAT  Take one tablet by mouth every 12 hours     lisinopril 40 MG tablet  Commonly known as:  PRINIVIL,ZESTRIL  Take 1 tablet (40 mg total) by mouth daily.     metFORMIN 1000 MG tablet  Commonly known as:  GLUCOPHAGE  Take 1 tablet (1,000 mg total) by mouth daily with breakfast.     NIFEdipine 90 MG 24 hr tablet  Commonly known as:  PROCARDIA XL/ADALAT-CC  Take  90 mg by mouth daily.     pioglitazone 45 MG tablet  Commonly known as:  ACTOS  Take 0.5 tablets (22.5 mg total) by mouth daily.     simvastatin 20 MG tablet  Commonly known as:  ZOCOR  Take 1 tablet (20 mg total) by mouth at bedtime.     venlafaxine XR 150 MG 24 hr capsule  Commonly known as:  EFFEXOR-XR  Take 150 mg by mouth daily with breakfast. Take 1-1/2 tablets (225 mg) daily          No Known Allergies   REVIEW OF SYSTEMS:  GENERAL: no change in appetite, no fatigue, no weight changes, no fever, chills or weakness RESPIRATORY: no cough, SOB, DOE, wheezing, hemoptysis CARDIAC: no chest pain, edema or palpitations GI: no abdominal pain, diarrhea, constipation, heart burn, nausea or vomiting  PHYSICAL EXAMINATION  GENERAL: no acute distress, obese NECK: supple, trachea midline, no neck masses, no thyroid tenderness, no thyromegaly LYMPHATICS: no LAN in the neck, no supraclavicular  LAN RESPIRATORY: breathing is even & unlabored, BS CTAB CARDIAC: RRR, no murmur,no extra heart sounds, no edema GI: abdomen soft, normal BS, no masses, no tenderness, no hepatomegaly, no splenomegaly, has umbilical hernia EXTREMITIES: limited ROM on bilateral shoulders; BLE weakness PSYCHIATRIC: the patient is alert & oriented to person, affect & behavior appropriate  LABS/RADIOLOGY: Labs reviewed: Basic Metabolic Panel:  Recent Labs  30/86/57 10/12/15  NA 143 142  K 4.3 4.1  BUN 18 20  CREATININE 0.8 0.7   Liver Function Tests:  Recent Labs  02/15/15 10/12/15  AST 10* 10*  ALT 8 6*  ALKPHOS 66 69    CBC:  Recent Labs  02/15/15 10/12/15  WBC 7.6 7.5  HGB 11.1* 11.7*  HCT 35* 38  PLT 158 161   Lipid Panel:  Recent Labs  02/15/15 10/12/15  HDL 27* 30*      ASSESSMENT/PLAN:  Seizure disorder - continue Vimpat 50 mg by mouth every 12 hours  Diabetes mellitus, type II -   hgbA1c 8.1; continue metformin 1000 mg by mouth daily,  Lantus 40 units subcutaneous daily at bedtime and Pioglitazone Hcl 45 mg take 22.5 mg PO daily  Hypertension - continue Lasix 20 mg by mouth daily, Lisinopril 40 mg by mouth daily, carvedilol to 12.5 mg by mouth twice a day, HCTZ 25 mg daily and nifedipine ER 90 mg by mouth daily  Depression - mood is stable; continue venlafaxine ER 225 mg by mouth daily  Hyperlipidemia - continue simvastatin 20 mg by mouth daily at bedtime  Constipation - continue Colace 100 mg take 2 capsules = 200 mg @ HS  History of CVA - continue Aggrenox 200-25 mg 1 by mouth twice a day; continue supportive care; check CBC  Vitamin D deficiency - continue Vitamin D  1000 units twice daily    Goals of care:  Long-term care    [facility], NP [facility] [phone]

## 2015-11-20 IMAGING — CR DG FEMUR 2V*L*
1 series · 4 of 4 positions shown · non-contrast
Comparison: None.

CLINICAL DATA: fall from standing onto left knee tonight around
284pm. Left hip pain.

EXAM:
LEFT FEMUR - 2 VIEW

[Series 2: t femur proximal ap left · 0.14mm/px · 4 of 4 slices shown]
[im 1/4]
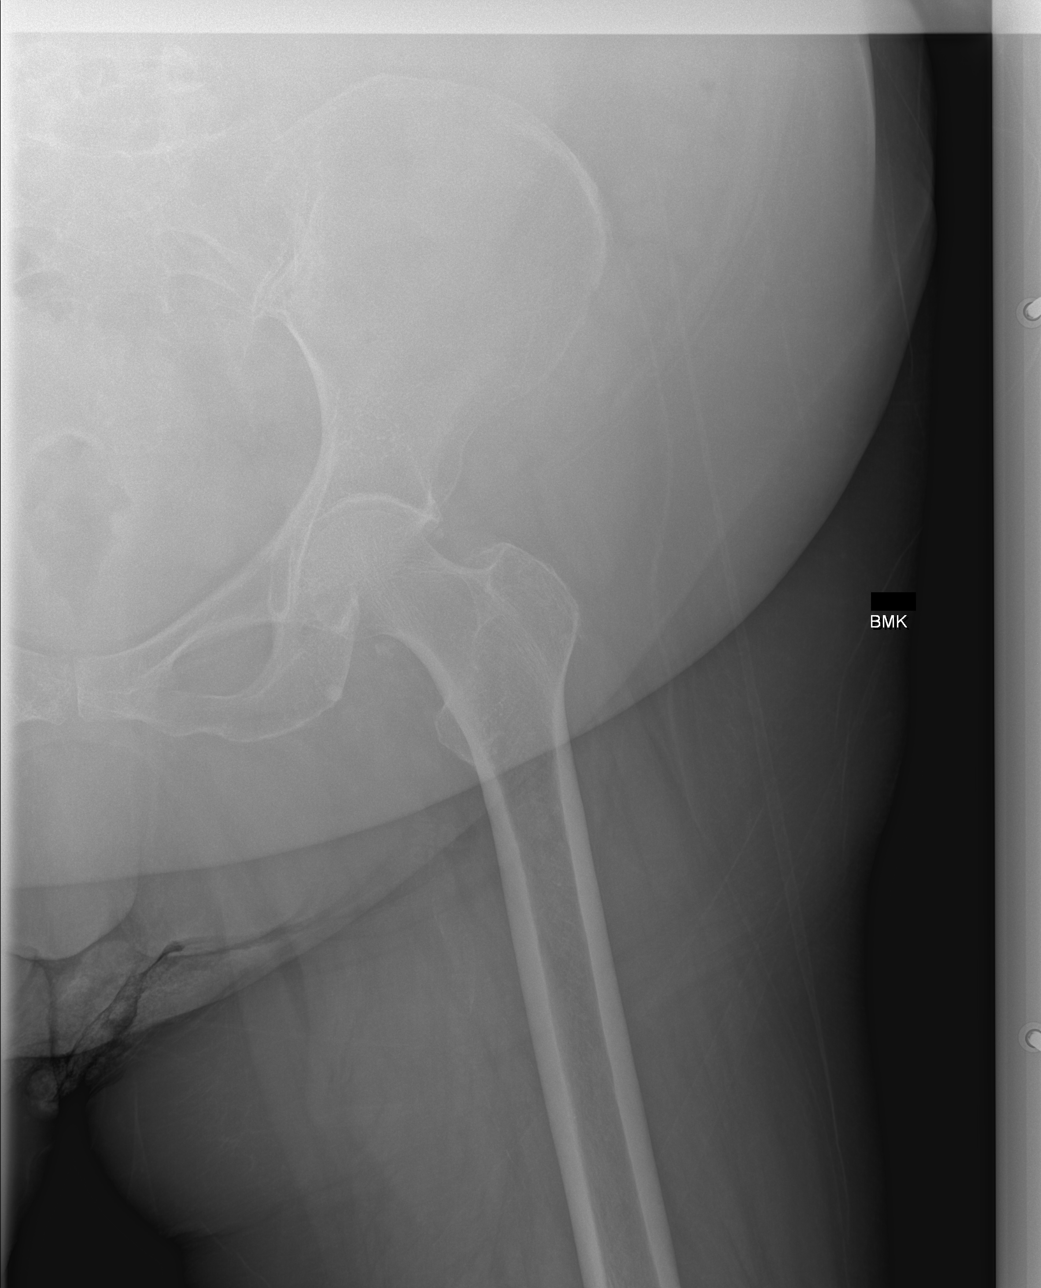
[im 2/4]
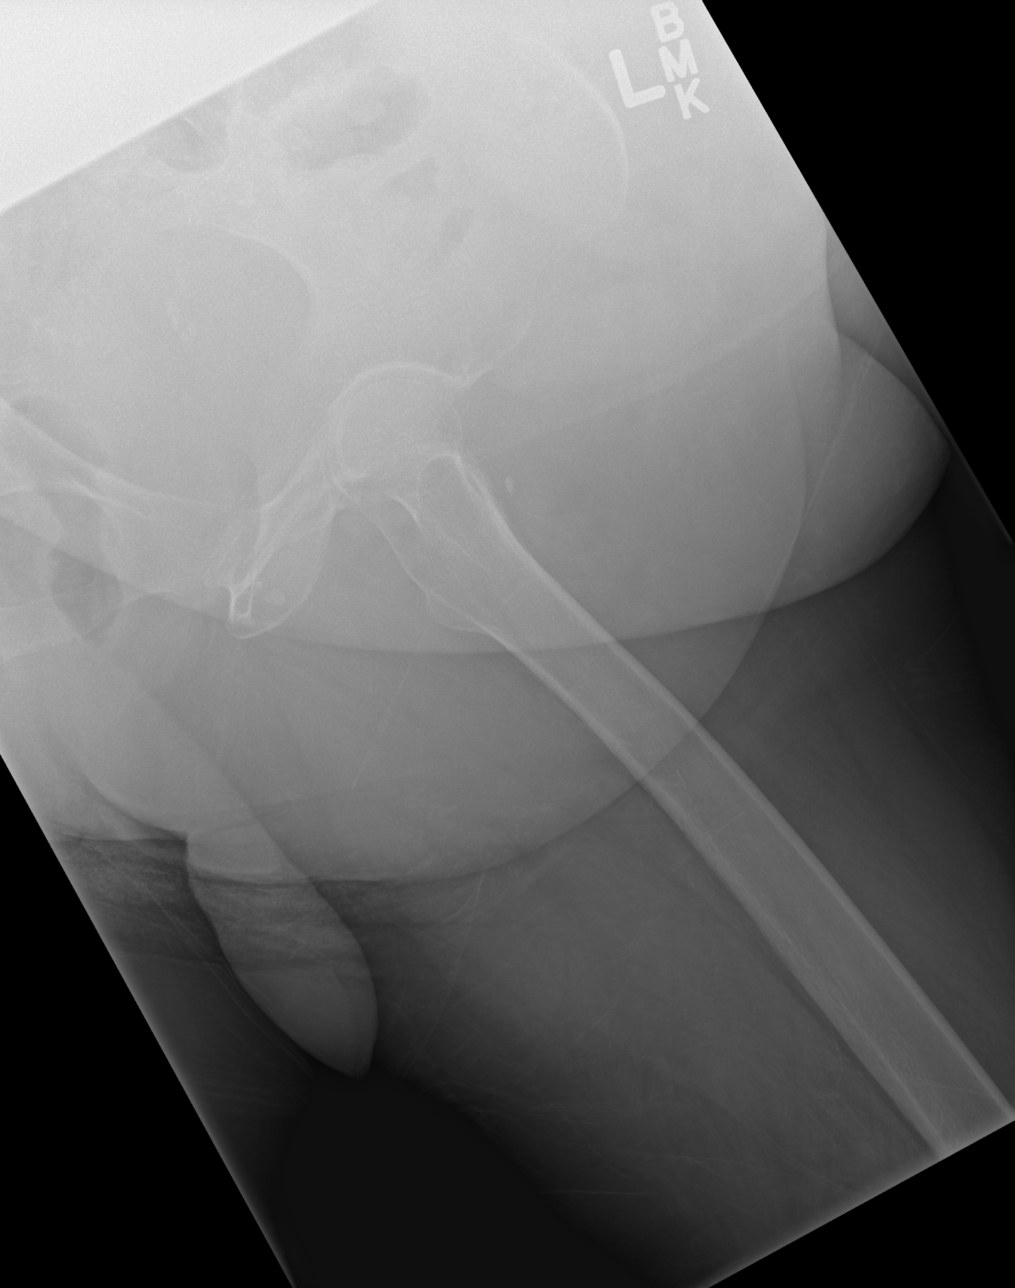
[im 3/4]
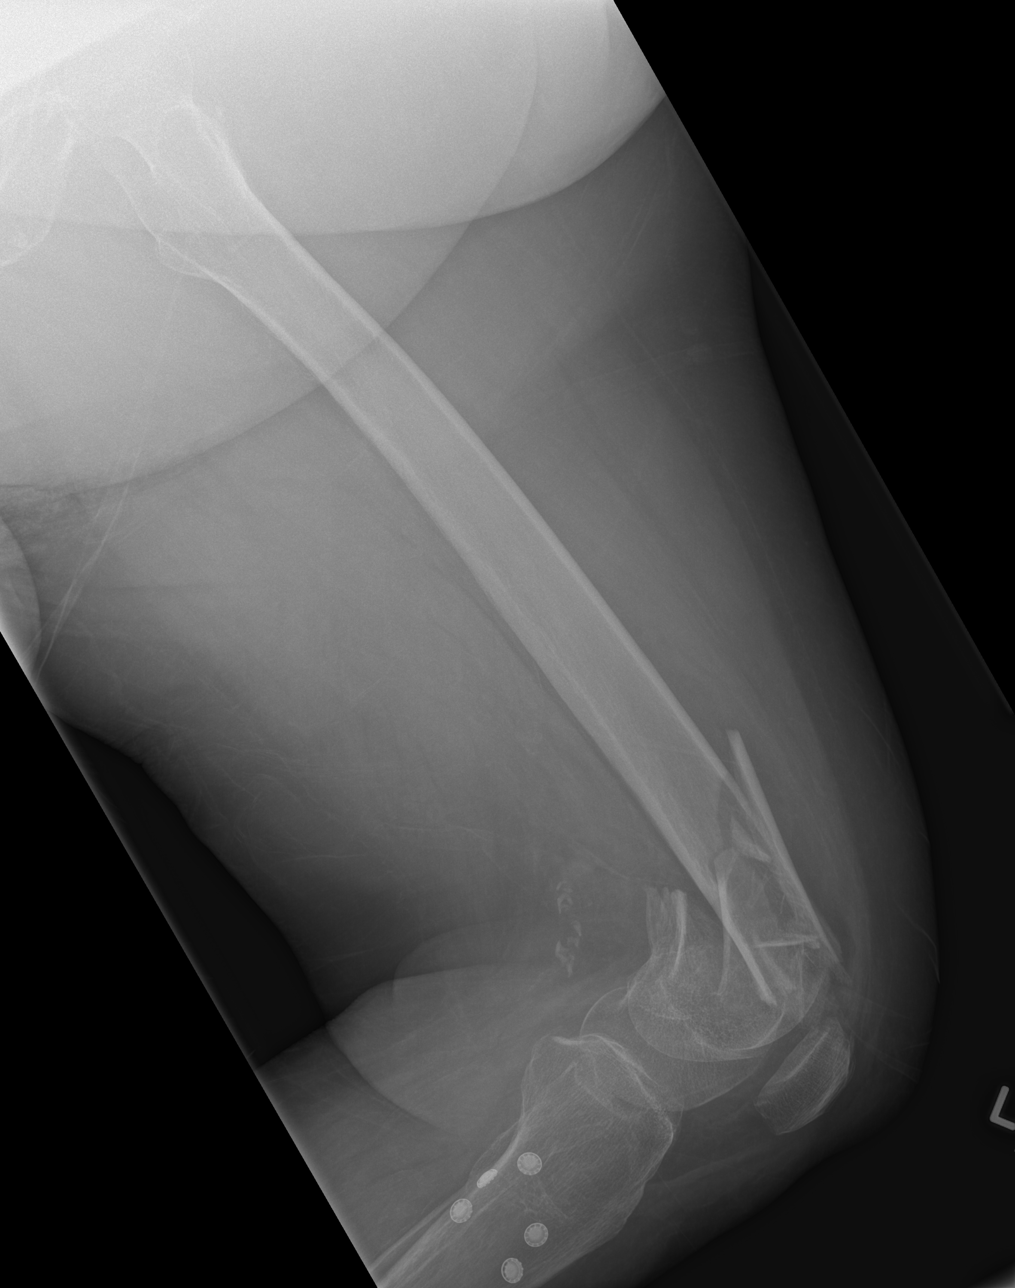
[im 4/4]
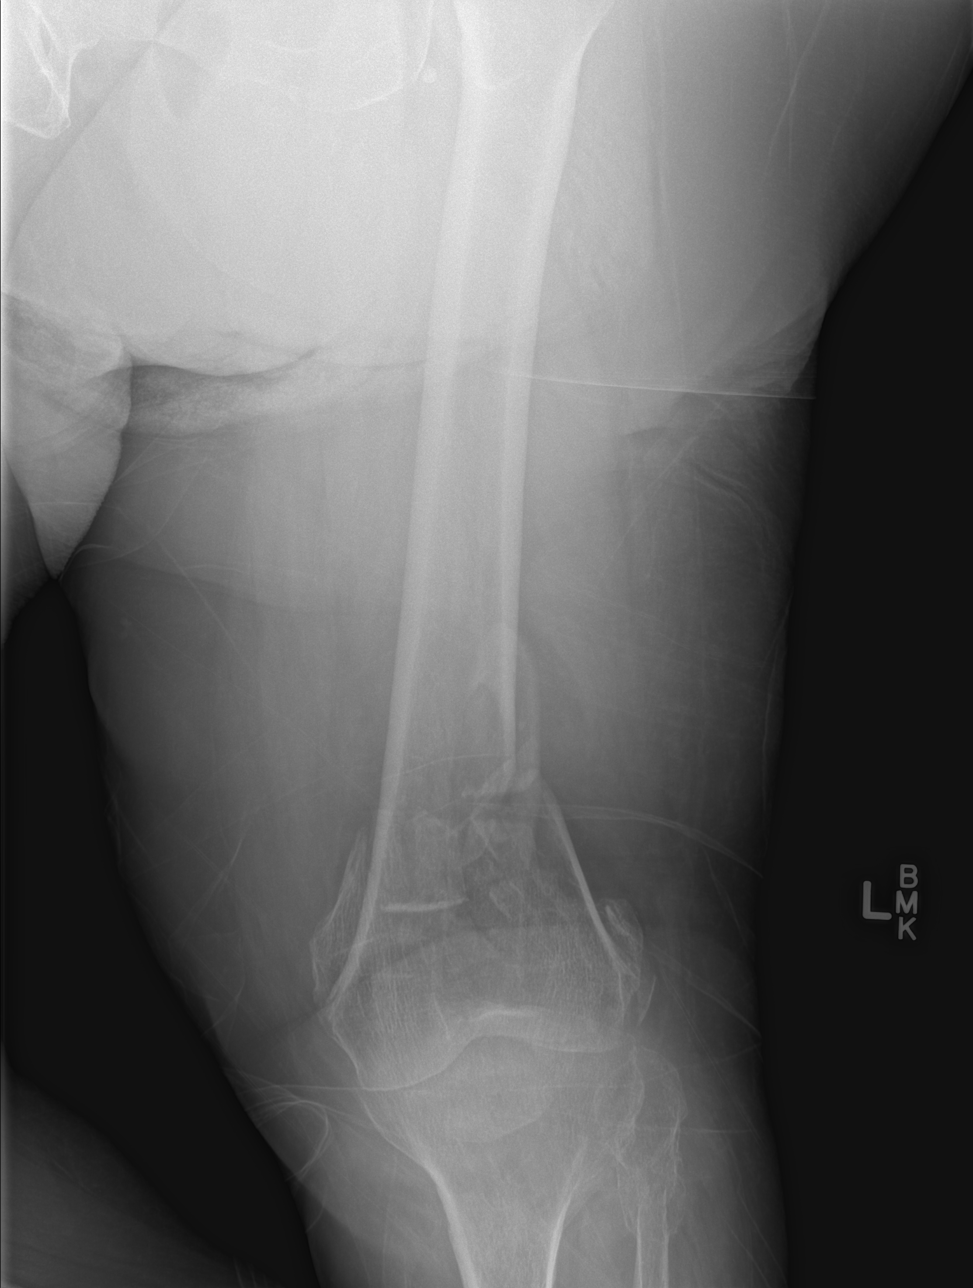

[4 of 4 positions shown; findings below may reference images not displayed]

FINDINGS: No fracture dislocation of the left hip.  No femoral neck fracture.

Evaluation the distal femur demonstrates a comminuted fracture of
the distal left femoral metaphysis. There is impaction of the
metaphysis into the femoral head with override. There is dorsal
displacement of the fracture fragment.
IMPRESSION: Severely comminuted fracture of the distal left femoral metaphysis
with impaction, override and dorsal angulation.

## 2015-11-23 ENCOUNTER — Other Ambulatory Visit: Payer: Self-pay

## 2015-11-23 ENCOUNTER — Encounter: Payer: Self-pay | Admitting: Adult Health

## 2015-11-23 ENCOUNTER — Other Ambulatory Visit: Payer: Self-pay | Admitting: *Deleted

## 2015-11-23 ENCOUNTER — Non-Acute Institutional Stay (SKILLED_NURSING_FACILITY): Payer: Medicare Other | Admitting: Adult Health

## 2015-11-23 DIAGNOSIS — E118 Type 2 diabetes mellitus with unspecified complications: Secondary | ICD-10-CM

## 2015-11-23 MED ORDER — LACOSAMIDE 50 MG PO TABS: ORAL_TABLET | ORAL | Status: DC

## 2015-11-23 NOTE — Telephone Encounter (Signed)
[facility] 

## 2015-11-23 NOTE — Progress Notes (Signed)
Patient ID: Yvette Gutierrez, female   DOB: [birth year], 70 y.o.   MRN: [id number]   DATE:  11/23/15  Facility:  Nursing Home Location:  [facility] Room Number: 204-1 LEVEL OF CARE:  SNF (31)  Chief Complaint  Patient presents with  . Acute Visit:  Diabetes Management        HISTORY OF PRESENT ILLNESS:  This is a 70 year old female who was noted to have CBGs 289, 215, 258, 236, 264. She is currently taking Actos, Glucophage and Lantus. Latest hgbA1c 8.6.  PAST MEDICAL HISTORY:  Past Medical History  Diagnosis Date  . Allergic rhinitis   . Major depressive disorder, recurrent episode, mild (HCC)   . Diabetes mellitus type 2 with complications (HCC)   . Neuropathy due to secondary diabetes (HCC)   . HTN (hypertension)   . Obesity   . CVA (cerebral infarction) 2002    Chronic unsteadiness  . Bilateral shoulder pain     chronic-uses vicodin a few times a month for pain control  . Femur fracture, left (HCC) 09/2014    nonsurgical treatment, immobilized  . Dementia   . Hyperlipidemia     CURRENT MEDICATIONS: Reviewed per MAR/see medication list   Medication List       This list is accurate as of: 11/23/15 11:59 PM.  Always use your most recent med list.               acetaminophen 500 MG tablet  Commonly known as:  TYLENOL  Take 500 mg by mouth every 6 (six) hours as needed.     carvedilol 12.5 MG tablet  Commonly known as:  COREG  Take 12.5 mg by mouth 2 (two) times daily with a meal.     Co-Enzyme Q10 100 MG Caps  Take 100 mg by mouth daily.     dipyridamole-aspirin 200-25 MG 12hr capsule  Commonly known as:  AGGRENOX  Take 1 capsule by mouth 2 (two) times daily.     docusate sodium 100 MG capsule  Commonly known as:  COLACE  Take 100 mg by mouth at bedtime. Take 2 capsules=200 mg PO @ HS     furosemide 20 MG tablet  Commonly known as:  LASIX  Take 1 tablet (20 mg total) by mouth daily.     hydrochlorothiazide 25 MG tablet   Commonly known as:  HYDRODIURIL  Take 25 mg by mouth daily. Hold for SBP <110     insulin glargine 100 UNIT/ML injection  Commonly known as:  LANTUS  Inject 40 Units into the skin at bedtime. Do not mix with other insulins.     lacosamide 50 MG Tabs tablet  Commonly known as:  VIMPAT  Take one tablet by mouth every 12 hours     lisinopril 40 MG tablet  Commonly known as:  PRINIVIL,ZESTRIL  Take 1 tablet (40 mg total) by mouth daily.     metFORMIN 1000 MG tablet  Commonly known as:  GLUCOPHAGE  Take 1 tablet (1,000 mg total) by mouth daily with breakfast.     NIFEdipine 90 MG 24 hr tablet  Commonly known as:  PROCARDIA XL/ADALAT-CC  Take 90 mg by mouth daily.     pioglitazone 45 MG tablet  Commonly known as:  ACTOS  Take 0.5 tablets (22.5 mg total) by mouth daily.     simvastatin 20 MG tablet  Commonly known as:  ZOCOR  Take 1 tablet (20 mg total) by mouth at bedtime.  venlafaxine XR 150 MG 24 hr capsule  Commonly known as:  EFFEXOR-XR  Take 150 mg by mouth daily with breakfast. Take 1-1/2 tablets (225 mg) daily     VITAMIN D-3 PO  Take 1,000 Units by mouth 2 (two) times daily.          No Known Allergies   REVIEW OF SYSTEMS:  GENERAL: no change in appetite, no fatigue, no weight changes, no fever, chills or weakness RESPIRATORY: no cough, SOB, DOE, wheezing, hemoptysis CARDIAC: no chest pain, edema or palpitations GI: no abdominal pain, diarrhea, constipation, heart burn, nausea or vomiting  PHYSICAL EXAMINATION  GENERAL: no acute distress, obese NECK: supple, trachea midline, no neck masses, no thyroid tenderness, no thyromegaly LYMPHATICS: no LAN in the neck, no supraclavicular LAN RESPIRATORY: breathing is even & unlabored, BS CTAB CARDIAC: RRR, no murmur,no extra heart sounds, no edema GI: abdomen soft, normal BS, no masses, no tenderness, no hepatomegaly, no splenomegaly, has umbilical hernia EXTREMITIES: limited ROM on bilateral shoulders; BLE  weakness PSYCHIATRIC: the patient is alert & oriented to person, affect & behavior appropriate  LABS/RADIOLOGY: Labs reviewed: Basic Metabolic Panel:  Recent Labs  16/10/96 10/12/15  NA 143 142  K 4.3 4.1  BUN 18 20  CREATININE 0.8 0.7   Liver Function Tests:  Recent Labs  02/15/15 10/12/15  AST 10* 10*  ALT 8 6*  ALKPHOS 66 69    CBC:  Recent Labs  02/15/15 10/12/15  WBC 7.6 7.5  HGB 11.1* 11.7*  HCT 35* 38  PLT 158 161   Lipid Panel:  Recent Labs  02/15/15 10/12/15  HDL 27* 30*      ASSESSMENT/PLAN:  Diabetes mellitus, type II -   hgbA1c 8.6; continue metformin 1000 mg by mouth daily,  Pioglitazone Hcl 45 mg take 22.5 mg PO daily and increase Lantus to 45 units SQ Q HS     Yvette Gutierrez,MONINA, NP [employer] [phone]

## 2015-12-13 ENCOUNTER — Encounter: Payer: Self-pay | Admitting: Adult Health

## 2015-12-13 ENCOUNTER — Non-Acute Institutional Stay (SKILLED_NURSING_FACILITY): Payer: Medicare Other | Admitting: Adult Health

## 2015-12-13 DIAGNOSIS — G40909 Epilepsy, unspecified, not intractable, without status epilepticus: Secondary | ICD-10-CM

## 2015-12-13 DIAGNOSIS — K5901 Slow transit constipation: Secondary | ICD-10-CM

## 2015-12-13 DIAGNOSIS — E785 Hyperlipidemia, unspecified: Secondary | ICD-10-CM | POA: Diagnosis not present

## 2015-12-13 DIAGNOSIS — E118 Type 2 diabetes mellitus with unspecified complications: Secondary | ICD-10-CM

## 2015-12-13 DIAGNOSIS — Z8673 Personal history of transient ischemic attack (TIA), and cerebral infarction without residual deficits: Secondary | ICD-10-CM

## 2015-12-13 DIAGNOSIS — I10 Essential (primary) hypertension: Secondary | ICD-10-CM

## 2015-12-13 DIAGNOSIS — F330 Major depressive disorder, recurrent, mild: Secondary | ICD-10-CM

## 2015-12-13 DIAGNOSIS — E559 Vitamin D deficiency, unspecified: Secondary | ICD-10-CM

## 2015-12-13 NOTE — Progress Notes (Signed)
Patient ID: Yvette Gutierrez, female   DOB: 12-Jul-1946, 70 y.o.   MRN: [id number]   DATE:    12/13/15  Facility:  Nursing Home Location:  [facility] Room Number: [id number] LEVEL OF CARE:  SNF (31)  Chief Complaint  Patient presents with  . Medical Management of Chronic Illnesses    Seizure, diabetes mellitus type 2, hypertension, depression, hyperlipidemia, constipation, history of CVA and vitamin D deficiency    HISTORY OF PRESENT ILLNESS:  This is a 70 year old female who is being seen for a routine visit. She is long-term care resident of [facility]. She has PMH of hypertension, diabetes mellitus type 2, old CVA, dementia, OSA, compression fracture of vertebrae, neuropathy and depression. Latest hgbA1c 8.1 dated 12/16  . Lantus was recently in creased to 47 units SQ Q HS. Psychiatric consultation was ordered for depression.   PAST MEDICAL HISTORY:  Past Medical History  Diagnosis Date  . Allergic rhinitis   . Major depressive disorder, recurrent episode, mild (HCC)   . Diabetes mellitus type 2 with complications (HCC)   . Neuropathy due to secondary diabetes (HCC)   . HTN (hypertension)   . Obesity   . CVA (cerebral infarction) 2002    Chronic unsteadiness  . Bilateral shoulder pain     chronic-uses vicodin a few times a month for pain control  . Femur fracture, left (HCC) 09/2014    nonsurgical treatment, immobilized  . Dementia   . Hyperlipidemia     CURRENT MEDICATIONS: Reviewed per MAR/see medication list   Medication List       This list is accurate as of: 12/13/15 11:59 PM.  Always use your most recent med list.               acetaminophen 500 MG tablet  Commonly known as:  TYLENOL  Take 500 mg by mouth every 6 (six) hours as needed.     carvedilol 12.5 MG tablet  Commonly known as:  COREG  Take 12.5 mg by mouth 2 (two) times daily with a meal.     Co-Enzyme Q10 100 MG Caps  Take 100 mg by mouth daily.     dipyridamole-aspirin  200-25 MG 12hr capsule  Commonly known as:  AGGRENOX  Take 1 capsule by mouth 2 (two) times daily.     docusate sodium 100 MG capsule  Commonly known as:  COLACE  Take 200 mg by mouth at bedtime. Take 2 capsules=200 mg PO @ HS     furosemide 20 MG tablet  Commonly known as:  LASIX  Take 1 tablet (20 mg total) by mouth daily.     hydrochlorothiazide 25 MG tablet  Commonly known as:  HYDRODIURIL  Take 25 mg by mouth daily. Hold for SBP <110     insulin glargine 100 UNIT/ML injection  Commonly known as:  LANTUS  Inject 47 Units into the skin at bedtime. Do not mix with other insulins.     lacosamide 50 MG Tabs tablet  Commonly known as:  VIMPAT  Take one tablet by mouth every 12 hours     lisinopril 40 MG tablet  Commonly known as:  PRINIVIL,ZESTRIL  Take 1 tablet (40 mg total) by mouth daily.     metFORMIN 1000 MG tablet  Commonly known as:  GLUCOPHAGE  Take 1 tablet (1,000 mg total) by mouth daily with breakfast.     NIFEdipine 90 MG 24 hr tablet  Commonly known as:  PROCARDIA XL/ADALAT-CC  Take 90 mg by mouth daily.     pioglitazone 45 MG tablet  Commonly known as:  ACTOS  Take 0.5 tablets (22.5 mg total) by mouth daily.     simvastatin 20 MG tablet  Commonly known as:  ZOCOR  Take 1 tablet (20 mg total) by mouth at bedtime.     venlafaxine XR 150 MG 24 hr capsule  Commonly known as:  EFFEXOR-XR  Take by mouth daily with breakfast. Take 1-1/2 tablets (225 mg) daily     VITAMIN D-3 PO  Take 1,000 Units by mouth 2 (two) times daily.         No Known Allergies   REVIEW OF SYSTEMS:  GENERAL: no change in appetite, no fatigue, no weight changes, no fever, chills or weakness RESPIRATORY: no cough, SOB, DOE, wheezing, hemoptysis CARDIAC: no chest pain, edema or palpitations GI: no abdominal pain, diarrhea, constipation, heart burn, nausea or vomiting  PHYSICAL EXAMINATION  GENERAL: no acute distress, obese SKIN:   Skin is warm and dry. NECK: supple,  trachea midline, no neck masses, no thyroid tenderness, no thyromegaly LYMPHATICS: no LAN in the neck, no supraclavicular LAN RESPIRATORY: breathing is even & unlabored, BS CTAB CARDIAC: RRR, no murmur,no extra heart sounds, no edema GI: abdomen soft, normal BS, no masses, no tenderness, no hepatomegaly, no splenomegaly, has umbilical hernia EXTREMITIES: limited ROM on bilateral shoulders; BLE weakness PSYCHIATRIC: the patient is alert & oriented to person, affect & behavior appropriate  LABS/RADIOLOGY: Labs reviewed: Basic Metabolic Panel:  Recent Labs  29/56/21 10/12/15  NA 143 142  K 4.3 4.1  BUN 18 20  CREATININE 0.8 0.7   Liver Function Tests:  Recent Labs  02/15/15 10/12/15  AST 10* 10*  ALT 8 6*  ALKPHOS 66 69    CBC:  Recent Labs  02/15/15 10/12/15  WBC 7.6 7.5  HGB 11.1* 11.7*  HCT 35* 38  PLT 158 161   Lipid Panel:  Recent Labs  02/15/15 10/12/15  HDL 27* 30*      ASSESSMENT/PLAN:  Seizure disorder -  No reported seizure activity; continue Vimpat 50 mg by mouth every 12 hours  Diabetes mellitus, type II -    continue metformin 1000 mg by mouth daily,  Lantus 47 units subcutaneous daily at bedtime and Pioglitazone Hcl 45 mg take 22.5 mg PO daily; check hgbA1c  Hypertension - continue Lasix 20 mg by mouth daily, Lisinopril 40 mg by mouth daily, carvedilol to 12.5 mg by mouth twice a day, HCTZ 25 mg daily and nifedipine ER 90 mg by mouth daily; check CMP  Depression - mood is stable; continue venlafaxine ER 225 mg by mouth daily; psychiatric consultation was ordered  Hyperlipidemia - continue simvastatin 20 mg by mouth daily at bedtime  Constipation - continue Colace 100 mg take 2 capsules = 200 mg @ HS  History of CVA - stable; continue Aggrenox 200-25 mg 1 by mouth twice a day; continue supportive care; check CBC  Vitamin D deficiency - continue Vitamin D  1000 units twice daily    Goals of care:  Long-term care    [facility],  NP [facility] [phone]

## 2015-12-17 LAB — CBC AND DIFFERENTIAL
HEMATOCRIT: 40 % (ref 36–46)
HEMOGLOBIN: 12.7 g/dL (ref 12.0–16.0)
NEUTROS ABS: 4 /uL
Platelets: 227 10*3/uL (ref 150–399)
WBC: 8.2 10^3/mL

## 2015-12-17 LAB — BASIC METABOLIC PANEL
BUN: 44 mg/dL — AB (ref 4–21)
CREATININE: 1.2 mg/dL — AB (ref 0.5–1.1)
GLUCOSE: 159 mg/dL
Potassium: 4.1 mmol/L (ref 3.4–5.3)
Sodium: 140 mmol/L (ref 137–147)

## 2015-12-17 LAB — HEMOGLOBIN A1C: HEMOGLOBIN A1C: 10

## 2015-12-17 LAB — HEPATIC FUNCTION PANEL
ALT: 7 U/L (ref 7–35)
AST: 13 U/L (ref 13–35)
Alkaline Phosphatase: 74 U/L (ref 25–125)
BILIRUBIN, TOTAL: 0.3 mg/dL

## 2015-12-18 ENCOUNTER — Non-Acute Institutional Stay (SKILLED_NURSING_FACILITY): Payer: Medicare Other | Admitting: Adult Health

## 2015-12-18 ENCOUNTER — Encounter: Payer: Self-pay | Admitting: Adult Health

## 2015-12-18 DIAGNOSIS — E1142 Type 2 diabetes mellitus with diabetic polyneuropathy: Secondary | ICD-10-CM | POA: Diagnosis not present

## 2015-12-18 NOTE — Progress Notes (Addendum)
Patient ID: Yvette Gutierrez, female   DOB: [birth year], 70 y.o.   MRN: [id number]   DATE:    12/18/15  Facility:  Nursing Home Location:  [facility] Room Number: 204-1 LEVEL OF CARE:  SNF (31)  Chief Complaint  Patient presents with  . Acute Visit :  Diabetes  Management        HISTORY OF PRESENT ILLNESS:  This is a 70 year old female who is being seen for an acute visit. CBGs in AM was noted to be ranging from 204 - 329. HgbA1C 10.0 . She is currently taking Actos, Metformin and Lantus.  PAST MEDICAL HISTORY:  Past Medical History  Diagnosis Date  . Allergic rhinitis   . Major depressive disorder, recurrent episode, mild (HCC)   . Diabetes mellitus type 2 with complications (HCC)   . Neuropathy due to secondary diabetes (HCC)   . HTN (hypertension)   . Obesity   . CVA (cerebral infarction) 2002    Chronic unsteadiness  . Bilateral shoulder pain     chronic-uses vicodin a few times a month for pain control  . Femur fracture, left (HCC) 09/2014    nonsurgical treatment, immobilized  . Dementia   . Hyperlipidemia   . Slow transit constipation   . Seizure disorder (HCC)   . Vitamin D deficiency     CURRENT MEDICATIONS: Reviewed per MAR/see medication list   Medication List       This list is accurate as of: 12/18/15  9:22 PM.  Always use your most recent med list.               acetaminophen 500 MG tablet  Commonly known as:  TYLENOL  Take 500 mg by mouth every 6 (six) hours as needed.     carvedilol 12.5 MG tablet  Commonly known as:  COREG  Take 12.5 mg by mouth 2 (two) times daily with a meal.     Co-Enzyme Q10 100 MG Caps  Take 100 mg by mouth daily.     dipyridamole-aspirin 200-25 MG 12hr capsule  Commonly known as:  AGGRENOX  Take 1 capsule by mouth 2 (two) times daily.     docusate sodium 100 MG capsule  Commonly known as:  COLACE  Take 200 mg by mouth at bedtime. Take 2 capsules=200 mg PO @ HS     furosemide 20 MG  tablet  Commonly known as:  LASIX  Take 1 tablet (20 mg total) by mouth daily.     hydrochlorothiazide 25 MG tablet  Commonly known as:  HYDRODIURIL  Take 25 mg by mouth daily. Hold for SBP <110     insulin glargine 100 UNIT/ML injection  Commonly known as:  LANTUS  Inject 50 Units into the skin at bedtime. Do not mix with other insulins.     lacosamide 50 MG Tabs tablet  Commonly known as:  VIMPAT  Take one tablet by mouth every 12 hours     lisinopril 40 MG tablet  Commonly known as:  PRINIVIL,ZESTRIL  Take 1 tablet (40 mg total) by mouth daily.     metFORMIN 1000 MG tablet  Commonly known as:  GLUCOPHAGE  Take 1 tablet (1,000 mg total) by mouth daily with breakfast.     NIFEdipine 90 MG 24 hr tablet  Commonly known as:  PROCARDIA XL/ADALAT-CC  Take 90 mg by mouth daily.     pioglitazone 45 MG tablet  Commonly known as:  ACTOS  Take 45  mg by mouth daily.     simvastatin 20 MG tablet  Commonly known as:  ZOCOR  Take 1 tablet (20 mg total) by mouth at bedtime.     venlafaxine XR 150 MG 24 hr capsule  Commonly known as:  EFFEXOR-XR  Take by mouth daily with breakfast. Take 1-1/2 tablets (225 mg) daily     VITAMIN D-3 PO  Take 1,000 Units by mouth 2 (two) times daily.          No Known Allergies   REVIEW OF SYSTEMS:  GENERAL: no change in appetite, no fatigue, no weight changes, no fever, chills or weakness RESPIRATORY: no cough, SOB, DOE, wheezing, hemoptysis CARDIAC: no chest pain, edema or palpitations GI: no abdominal pain, diarrhea, constipation, heart burn, nausea or vomiting  PHYSICAL EXAMINATION  GENERAL: no acute distress, obese SKIN:   Skin is warm and dry. NECK: supple, trachea midline, no neck masses, no thyroid tenderness, no thyromegaly LYMPHATICS: no LAN in the neck, no supraclavicular LAN RESPIRATORY: breathing is even & unlabored, BS CTAB CARDIAC: RRR, no murmur,no extra heart sounds, no edema GI: abdomen soft, normal BS, no masses, no  tenderness, no hepatomegaly, no splenomegaly, has umbilical hernia EXTREMITIES: limited ROM on bilateral shoulders; BLE weakness PSYCHIATRIC: the patient is alert & oriented to person, affect & behavior appropriate  LABS/RADIOLOGY: Labs reviewed: 12/17/15  HgbA1c 10.0 Basic Metabolic Panel:  Recent Labs  10/21/30 10/30/15 12/17/15  NA 142 139 140  K 4.1 4.1 4.1  BUN 20 22* 44*  CREATININE 0.7 0.8 1.2*   Liver Function Tests:  Recent Labs  10/12/15 10/30/15 12/17/15  AST 10* 8* 13  ALT 6* 5* 7  ALKPHOS 69 72 74    CBC:  Recent Labs  10/12/15 10/30/15 12/17/15  WBC 7.5 7.2 8.2  NEUTROABS  --  4 4  HGB 11.7* 11.8* 12.7  HCT 38 38 40  PLT 161 163 227   Lipid Panel:  Recent Labs  02/15/15 10/12/15  HDL 27* 30*      ASSESSMENT/PLAN:  Diabetes mellitus, type II -    continue metformin 1000 mg by mouth daily,  increase Lantus to  50 units subcutaneous daily at bedtime and increase Pioglitazone Hcl 45 mg 1 tab PO daily    MEDINA-VARGAS,Brennyn Ortlieb, NP [employer] [employer] [phone]

## 2015-12-19 LAB — TSH: TSH: 1.94 u[IU]/mL (ref 0.41–5.90)

## 2015-12-24 ENCOUNTER — Other Ambulatory Visit: Payer: Self-pay | Admitting: *Deleted

## 2015-12-24 MED ORDER — LACOSAMIDE 50 MG PO TABS: ORAL_TABLET | ORAL | Status: DC

## 2015-12-24 NOTE — Telephone Encounter (Signed)
[facility] 

## 2016-01-14 ENCOUNTER — Non-Acute Institutional Stay (SKILLED_NURSING_FACILITY): Payer: Medicare Other | Admitting: Adult Health

## 2016-01-14 ENCOUNTER — Encounter: Payer: Self-pay | Admitting: Adult Health

## 2016-01-14 DIAGNOSIS — Z8673 Personal history of transient ischemic attack (TIA), and cerebral infarction without residual deficits: Secondary | ICD-10-CM

## 2016-01-14 DIAGNOSIS — K5901 Slow transit constipation: Secondary | ICD-10-CM

## 2016-01-14 DIAGNOSIS — G40909 Epilepsy, unspecified, not intractable, without status epilepticus: Secondary | ICD-10-CM | POA: Diagnosis not present

## 2016-01-14 DIAGNOSIS — E559 Vitamin D deficiency, unspecified: Secondary | ICD-10-CM

## 2016-01-14 DIAGNOSIS — I10 Essential (primary) hypertension: Secondary | ICD-10-CM | POA: Diagnosis not present

## 2016-01-14 DIAGNOSIS — E785 Hyperlipidemia, unspecified: Secondary | ICD-10-CM

## 2016-01-14 DIAGNOSIS — E1142 Type 2 diabetes mellitus with diabetic polyneuropathy: Secondary | ICD-10-CM | POA: Diagnosis not present

## 2016-01-14 DIAGNOSIS — F330 Major depressive disorder, recurrent, mild: Secondary | ICD-10-CM | POA: Diagnosis not present

## 2016-01-14 NOTE — Progress Notes (Signed)
Patient ID: Yvette Gutierrez, female   DOB: [birth year], 70 y.o.   MRN: [id number]   DATE:      01/14/16  Facility:  Nursing Home Location:  [facility] Room Number: 204-1 LEVEL OF CARE:  SNF (31)  Chief Complaint  Patient presents with  . Medical Management of Chronic Illnesses    Seizure, diabetes mellitus type 2, hypertension, depression, hyperlipidemia, constipation, history of CVA and vitamin D deficiency    HISTORY OF PRESENT ILLNESS:  This is a 70 year old female who is being seen for a routine visit. She is long-term care resident of [facility]. She has PMH of hypertension, diabetes mellitus type 2, old CVA, dementia, OSA, compression fracture of vertebrae, neuropathy and depression. Her Lantus was recently increased to 50 units and Pioglitazone increased to 45 mg daily. Latest hgbA1c 10.0. Her mood is stable and currently takes Effexor. No seizure episode and currently on Vimpat daily.  PAST MEDICAL HISTORY:  Past Medical History  Diagnosis Date  . Allergic rhinitis   . Major depressive disorder, recurrent episode, mild (HCC)   . Diabetes mellitus type 2 with complications (HCC)   . Neuropathy due to secondary diabetes (HCC)   . HTN (hypertension)   . Obesity   . CVA (cerebral infarction) 2002    Chronic unsteadiness  . Bilateral shoulder pain     chronic-uses vicodin a few times a month for pain control  . Femur fracture, left (HCC) 09/2014    nonsurgical treatment, immobilized  . Dementia   . Hyperlipidemia   . Slow transit constipation   . Seizure disorder (HCC)   . Vitamin D deficiency     CURRENT MEDICATIONS: Reviewed per MAR/see medication list   Medication List       This list is accurate as of: 01/14/16 11:59 PM.  Always use your most recent med list.               acetaminophen 500 MG tablet  Commonly known as:  TYLENOL  Take 500 mg by mouth every 6 (six) hours as needed.     carvedilol 12.5 MG tablet  Commonly known as:   COREG  Take 12.5 mg by mouth 2 (two) times daily with a meal.     Co-Enzyme Q10 100 MG Caps  Take 100 mg by mouth daily.     dipyridamole-aspirin 200-25 MG 12hr capsule  Commonly known as:  AGGRENOX  Take 1 capsule by mouth 2 (two) times daily.     docusate sodium 100 MG capsule  Commonly known as:  COLACE  Take 200 mg by mouth at bedtime. Take 2 capsules=200 mg PO @ HS     furosemide 20 MG tablet  Commonly known as:  LASIX  Take 1 tablet (20 mg total) by mouth daily.     hydrochlorothiazide 25 MG tablet  Commonly known as:  HYDRODIURIL  Take 25 mg by mouth daily. Hold for SBP <110     insulin glargine 100 UNIT/ML injection  Commonly known as:  LANTUS  Inject 50 Units into the skin at bedtime. Do not mix with other insulins.     lacosamide 50 MG Tabs tablet  Commonly known as:  VIMPAT  Take one tablet by mouth every 12 hours     lisinopril 40 MG tablet  Commonly known as:  PRINIVIL,ZESTRIL  Take 1 tablet (40 mg total) by mouth daily.     metFORMIN 1000 MG tablet  Commonly known as:  GLUCOPHAGE  Take 1 tablet (1,000 mg total) by mouth daily with breakfast.     NIFEdipine 90 MG 24 hr tablet  Commonly known as:  PROCARDIA XL/ADALAT-CC  Take 90 mg by mouth daily.     pioglitazone 45 MG tablet  Commonly known as:  ACTOS  Take 45 mg by mouth daily.     simvastatin 20 MG tablet  Commonly known as:  ZOCOR  Take 1 tablet (20 mg total) by mouth at bedtime.     venlafaxine XR 150 MG 24 hr capsule  Commonly known as:  EFFEXOR-XR  Take by mouth daily with breakfast. Take 1-1/2 tablets (225 mg) daily     VITAMIN D-3 PO  Take 1,000 Units by mouth 2 (two) times daily.         No Known Allergies   REVIEW OF SYSTEMS:  GENERAL: no change in appetite, no fatigue, no weight changes, no fever, chills or weakness RESPIRATORY: no cough, SOB, DOE, wheezing, hemoptysis CARDIAC: no chest pain, edema or palpitations GI: no abdominal pain, diarrhea, constipation, heart burn,  nausea or vomiting  PHYSICAL EXAMINATION  GENERAL: no acute distress, obese SKIN:   Skin is warm and dry. NECK: supple, trachea midline, no neck masses, no thyroid tenderness, no thyromegaly LYMPHATICS: no LAN in the neck, no supraclavicular LAN RESPIRATORY: breathing is even & unlabored, BS CTAB CARDIAC: RRR, no murmur,no extra heart sounds, no edema GI: abdomen soft, normal BS, no masses, no tenderness, no hepatomegaly, no splenomegaly, has umbilical hernia EXTREMITIES: limited ROM on bilateral shoulders; BLE weakness PSYCHIATRIC: the patient is alert & oriented to person, affect & behavior appropriate  LABS/RADIOLOGY: Labs reviewed: Basic Metabolic Panel:  Recent Labs  69/62/95 10/30/15 12/17/15  NA 142 139 140  K 4.1 4.1 4.1  BUN 20 22* 44*  CREATININE 0.7 0.8 1.2*   Liver Function Tests:  Recent Labs  10/12/15 10/30/15 12/17/15  AST 10* 8* 13  ALT 6* 5* 7  ALKPHOS 69 72 74    CBC:  Recent Labs  10/12/15 10/30/15 12/17/15  WBC 7.5 7.2 8.2  NEUTROABS  --  4 4  HGB 11.7* 11.8* 12.7  HCT 38 38 40  PLT 161 163 227   Lipid Panel:  Recent Labs  02/15/15 10/12/15  HDL 27* 30*      ASSESSMENT/PLAN:  Hypertension - continue Lasix 20 mg by mouth daily, Lisinopril 40 mg by mouth daily, carvedilol to 12.5 mg by mouth twice a day, HCTZ 25 mg daily and nifedipine ER 90 mg by mouth daily  Seizure disorder -  No reported seizure activity; continue Vimpat 50 mg by mouth every 12 hours  Diabetes mellitus, type II -    continue metformin 1000 mg by mouth daily,  Lantus 47 units subcutaneous daily at bedtime and Pioglitazone Hcl 45 mg take 22.5 mg PO daily;  hgbA1c 10.0  History of CVA - stable; continue Aggrenox 200-25 mg 1 by mouth twice a day; continue supportive care; stool occult blood X 3 for screening  Vitamin D deficiency - continue Vitamin D  1000 units twice daily  Depression - mood is stable; continue venlafaxine ER 225 mg by mouth daily; psychiatric  consultation was ordered  Hyperlipidemia - continue simvastatin 20 mg by mouth daily at bedtime @ HS      Goals of care:  Long-term care    [facility], NP [facility] [phone]

## 2016-02-13 ENCOUNTER — Non-Acute Institutional Stay (SKILLED_NURSING_FACILITY): Payer: Medicare Other | Admitting: Adult Health

## 2016-02-13 ENCOUNTER — Encounter: Payer: Self-pay | Admitting: Adult Health

## 2016-02-13 DIAGNOSIS — E559 Vitamin D deficiency, unspecified: Secondary | ICD-10-CM

## 2016-02-13 DIAGNOSIS — F330 Major depressive disorder, recurrent, mild: Secondary | ICD-10-CM | POA: Diagnosis not present

## 2016-02-13 DIAGNOSIS — G40909 Epilepsy, unspecified, not intractable, without status epilepticus: Secondary | ICD-10-CM | POA: Diagnosis not present

## 2016-02-13 DIAGNOSIS — E785 Hyperlipidemia, unspecified: Secondary | ICD-10-CM

## 2016-02-13 DIAGNOSIS — I639 Cerebral infarction, unspecified: Secondary | ICD-10-CM

## 2016-02-13 DIAGNOSIS — E1142 Type 2 diabetes mellitus with diabetic polyneuropathy: Secondary | ICD-10-CM | POA: Diagnosis not present

## 2016-02-13 DIAGNOSIS — I10 Essential (primary) hypertension: Secondary | ICD-10-CM

## 2016-02-13 DIAGNOSIS — Z8673 Personal history of transient ischemic attack (TIA), and cerebral infarction without residual deficits: Secondary | ICD-10-CM

## 2016-02-13 NOTE — Progress Notes (Signed)
Patient ID: Yvette Gutierrez, female   DOB: 1946-05-10, 70 y.o.   MRN: [id number]   DATE:      02/13/16  Facility:  Nursing Home Location:  [facility] Room Number: [id number] LEVEL OF CARE:  SNF (31)  Chief Complaint  Patient presents with  . Medical Management of Chronic Illnesses    Seizure, diabetes mellitus type 2, hypertension, depression, hyperlipidemia, constipation, history of CVA and vitamin D deficiency    HISTORY OF PRESENT ILLNESS:  This is a 70 year old female who is being seen for a routine visit. She is long-term care resident of [facility]. She has PMH of hypertension, diabetes mellitus type 2, old CVA, dementia, OSA, compression fracture of vertebrae, neuropathy and depression. BP review showed - 139/72, 122/56, 120/76, stable. She ic currently on Lasix, Lisinopril, Carvedilol, HCTZ and Nifedipine ER. CBG review - 108, 245, 112, 203, 220. She is currently taking Glucophage, Actos and Lantus.  PAST MEDICAL HISTORY:  Past Medical History  Diagnosis Date  . Allergic rhinitis   . Major depressive disorder, recurrent episode, mild (HCC)   . Diabetes mellitus type 2 with complications (HCC)   . Neuropathy due to secondary diabetes (HCC)   . HTN (hypertension)   . Obesity   . CVA (cerebral infarction) 2002    Chronic unsteadiness  . Bilateral shoulder pain     chronic-uses vicodin a few times a month for pain control  . Femur fracture, left (HCC) 09/2014    nonsurgical treatment, immobilized  . Dementia   . Hyperlipidemia   . Slow transit constipation   . Seizure disorder (HCC)   . Vitamin D deficiency     CURRENT MEDICATIONS: Reviewed per MAR/see medication list   Medication List       This list is accurate as of: 02/13/16  1:20 PM.  Always use your most recent med list.               acetaminophen 500 MG tablet  Commonly known as:  TYLENOL  Take 500 mg by mouth every 6 (six) hours as needed.     carvedilol 12.5 MG tablet   Commonly known as:  COREG  Take 12.5 mg by mouth 2 (two) times daily with a meal.     Co-Enzyme Q10 100 MG Caps  Take 100 mg by mouth daily.     dipyridamole-aspirin 200-25 MG 12hr capsule  Commonly known as:  AGGRENOX  Take 1 capsule by mouth 2 (two) times daily.     docusate sodium 100 MG capsule  Commonly known as:  COLACE  Take 200 mg by mouth at bedtime. Take 2 capsules=200 mg PO @ HS     furosemide 20 MG tablet  Commonly known as:  LASIX  Take 1 tablet (20 mg total) by mouth daily.     hydrochlorothiazide 25 MG tablet  Commonly known as:  HYDRODIURIL  Take 25 mg by mouth daily. Hold for SBP <110     insulin glargine 100 UNIT/ML injection  Commonly known as:  LANTUS  Inject 50 Units into the skin at bedtime. Do not mix with other insulins.     lacosamide 50 MG Tabs tablet  Commonly known as:  VIMPAT  Take one tablet by mouth every 12 hours     lisinopril 40 MG tablet  Commonly known as:  PRINIVIL,ZESTRIL  Take 1 tablet (40 mg total) by mouth daily.     metFORMIN 1000 MG tablet  Commonly known as:  GLUCOPHAGE  Take 1 tablet (1,000 mg total) by mouth daily with breakfast.     NIFEdipine 90 MG 24 hr tablet  Commonly known as:  PROCARDIA XL/ADALAT-CC  Take 90 mg by mouth daily.     pioglitazone 45 MG tablet  Commonly known as:  ACTOS  Take 45 mg by mouth daily.     simvastatin 20 MG tablet  Commonly known as:  ZOCOR  Take 1 tablet (20 mg total) by mouth at bedtime.     venlafaxine XR 150 MG 24 hr capsule  Commonly known as:  EFFEXOR-XR  Take by mouth daily with breakfast. Take 1-1/2 tablets (225 mg) daily     VITAMIN D-3 PO  Take 1,000 Units by mouth 2 (two) times daily.         No Known Allergies   REVIEW OF SYSTEMS:  GENERAL: no change in appetite, no fatigue, no weight changes, no fever, chills or weakness RESPIRATORY: no cough, SOB, DOE, wheezing, hemoptysis CARDIAC: no chest pain, edema or palpitations GI: no abdominal pain, diarrhea,  constipation, heart burn, nausea or vomiting  PHYSICAL EXAMINATION  GENERAL: no acute distress, obese SKIN:   Skin is warm and dry. NECK: supple, trachea midline, no neck masses, no thyroid tenderness, no thyromegaly LYMPHATICS: no LAN in the neck, no supraclavicular LAN RESPIRATORY: breathing is even & unlabored, BS CTAB CARDIAC: RRR, no murmur,no extra heart sounds, no edema GI: abdomen soft, normal BS, no masses, no tenderness, no hepatomegaly, no splenomegaly, has umbilical hernia EXTREMITIES: limited ROM on bilateral shoulders; BLE weakness; uses hoyer lift for transfers PSYCHIATRIC: the patient is alert & oriented to person, affect & behavior appropriate  LABS/RADIOLOGY: Labs reviewed: Basic Metabolic Panel:  Recent Labs  25/95/63 10/30/15 12/17/15  NA 142 139 140  K 4.1 4.1 4.1  BUN 20 22* 44*  CREATININE 0.7 0.8 1.2*   Liver Function Tests:  Recent Labs  10/12/15 10/30/15 12/17/15  AST 10* 8* 13  ALT 6* 5* 7  ALKPHOS 69 72 74    CBC:  Recent Labs  10/12/15 10/30/15 12/17/15  WBC 7.5 7.2 8.2  NEUTROABS  --  4 4  HGB 11.7* 11.8* 12.7  HCT 38 38 40  PLT 161 163 227   Lipid Panel:  Recent Labs  02/15/15 10/12/15  HDL 27* 30*      ASSESSMENT/PLAN:  Diabetes mellitus, type II -    continue metformin 1000 mg by mouth daily,  Lantus 47 units subcutaneous daily at bedtime and Pioglitazone Hcl 45 mg take 22.5 mg PO daily;  hgbA1c 10.0  History of CVA - stable; continue Aggrenox 200-25 mg 1 by mouth twice a day; continue supportive care  Depression - mood is stable; continue venlafaxine ER 225 mg by mouth daily; psychiatric consultation was ordered  Hypertension - continue Lasix 20 mg by mouth daily, Lisinopril 40 mg by mouth daily, carvedilol to 12.5 mg by mouth twice a day, HCTZ 25 mg daily and nifedipine ER 90 mg by mouth daily  Seizure disorder -  No reported seizure activity; continue Vimpat 50 mg by mouth every 12 hours  Diabetes mellitus, type II -     continue metformin 1000 mg by mouth daily,  Lantus 47 units subcutaneous daily at bedtime and Pioglitazone Hcl 45 mg take 22.5 mg PO daily;  hgbA1c 10.0  Vitamin D deficiency - continue Vitamin D  1000 units twice daily  Hyperlipidemia - continue simvastatin 20 mg by mouth daily at bedtime Lipid Panel  Component Value Date/Time   CHOL 132 10/12/2015   CHOL 110 08/29/2014 0431   TRIG 272* 10/12/2015   TRIG 215* 08/29/2014 0431   HDL 30* 10/12/2015   HDL 38* 08/29/2014 0431   CHOLHDL 3 10/25/2013 1122   VLDL 43* 08/29/2014 0431   VLDL 29.4 10/25/2013 1122   LDLCALC 47 10/12/2015   LDLCALC 29 08/29/2014 0431   LDLDIRECT 52.3 01/04/2010 0914      Goals of care:  Long-term care    MEDINA-VARGAS,Kadynce Bonds, NP [facility] [phone]

## 2016-02-18 ENCOUNTER — Other Ambulatory Visit: Payer: Self-pay

## 2016-02-18 MED ORDER — LACOSAMIDE 50 MG PO TABS: ORAL_TABLET | ORAL | Status: AC

## 2016-02-18 NOTE — Telephone Encounter (Signed)
Rx fax number [phone], phone number [phone]  

## 2016-02-26 LAB — HM DIABETES FOOT EXAM

## 2016-03-20 ENCOUNTER — Non-Acute Institutional Stay (SKILLED_NURSING_FACILITY): Payer: Medicare Other | Admitting: Adult Health

## 2016-03-20 ENCOUNTER — Encounter: Payer: Self-pay | Admitting: Adult Health

## 2016-03-20 DIAGNOSIS — E1142 Type 2 diabetes mellitus with diabetic polyneuropathy: Secondary | ICD-10-CM | POA: Diagnosis not present

## 2016-03-20 DIAGNOSIS — E559 Vitamin D deficiency, unspecified: Secondary | ICD-10-CM | POA: Diagnosis not present

## 2016-03-20 DIAGNOSIS — Z8673 Personal history of transient ischemic attack (TIA), and cerebral infarction without residual deficits: Secondary | ICD-10-CM | POA: Diagnosis not present

## 2016-03-20 DIAGNOSIS — I10 Essential (primary) hypertension: Secondary | ICD-10-CM

## 2016-03-20 DIAGNOSIS — F330 Major depressive disorder, recurrent, mild: Secondary | ICD-10-CM | POA: Diagnosis not present

## 2016-03-20 DIAGNOSIS — G40909 Epilepsy, unspecified, not intractable, without status epilepticus: Secondary | ICD-10-CM | POA: Diagnosis not present

## 2016-03-20 DIAGNOSIS — E785 Hyperlipidemia, unspecified: Secondary | ICD-10-CM | POA: Diagnosis not present

## 2016-03-20 NOTE — Progress Notes (Signed)
Patient ID: Yvette Gutierrez, female   DOB: May 11, 1946, 70 y.o.   MRN: [id number]   DATE:      03/20/16  Facility:  Nursing Home Location:  [facility] Room Number: [id number] LEVEL OF CARE:  SNF (31)  Chief Complaint  Patient presents with  . Medical Management of Chronic Illnesses    HISTORY OF PRESENT ILLNESS:  This is a 70 year old female who is being seen for a routine visit. She is long-term care resident of [facility].  She was recently discharged from PT services. She is now using sit-to-stand lift during transfers. Her mood is stable and continues to take Effexor  for depression. CBG reviewed as follows:  130, 126, 160, 112, 115.   PAST MEDICAL HISTORY:  Past Medical History  Diagnosis Date  . Allergic rhinitis   . Major depressive disorder, recurrent episode, mild (HCC)   . Diabetes mellitus type 2 with complications (HCC)   . Neuropathy due to secondary diabetes (HCC)   . HTN (hypertension)   . Obesity   . CVA (cerebral infarction) 2002    Chronic unsteadiness  . Bilateral shoulder pain     chronic-uses vicodin a few times a month for pain control  . Femur fracture, left (HCC) 09/2014    nonsurgical treatment, immobilized  . Dementia   . Hyperlipidemia   . Slow transit constipation   . Seizure disorder (HCC)   . Vitamin D deficiency     CURRENT MEDICATIONS: Reviewed per MAR/see medication list   Medication List       This list is accurate as of: 03/20/16 10:11 PM.  Always use your most recent med list.               acetaminophen 500 MG tablet  Commonly known as:  TYLENOL  Take 500 mg by mouth every 6 (six) hours as needed.     carvedilol 12.5 MG tablet  Commonly known as:  COREG  Take 12.5 mg by mouth 2 (two) times daily with a meal.     Co-Enzyme Q10 100 MG Caps  Take 100 mg by mouth daily.     dipyridamole-aspirin 200-25 MG 12hr capsule  Commonly known as:  AGGRENOX  Take 1 capsule by mouth 2 (two) times daily.      docusate sodium 100 MG capsule  Commonly known as:  COLACE  Take 200 mg by mouth at bedtime. Take 2 capsules=200 mg PO @ HS     furosemide 20 MG tablet  Commonly known as:  LASIX  Take 1 tablet (20 mg total) by mouth daily.     hydrochlorothiazide 25 MG tablet  Commonly known as:  HYDRODIURIL  Take 25 mg by mouth daily. Hold for SBP <110     insulin glargine 100 UNIT/ML injection  Commonly known as:  LANTUS  Inject 50 Units into the skin at bedtime. Do not mix with other insulins.     lacosamide 50 MG Tabs tablet  Commonly known as:  VIMPAT  Take one tablet by mouth every 12 hours     lisinopril 40 MG tablet  Commonly known as:  PRINIVIL,ZESTRIL  Take 1 tablet (40 mg total) by mouth daily.     metFORMIN 1000 MG tablet  Commonly known as:  GLUCOPHAGE  Take 1 tablet (1,000 mg total) by mouth daily with breakfast.     NIFEdipine 90 MG 24 hr tablet  Commonly known as:  PROCARDIA XL/ADALAT-CC  Take 90 mg by  mouth daily.     pioglitazone 45 MG tablet  Commonly known as:  ACTOS  Take 45 mg by mouth daily.     simvastatin 20 MG tablet  Commonly known as:  ZOCOR  Take 1 tablet (20 mg total) by mouth at bedtime.     venlafaxine XR 150 MG 24 hr capsule  Commonly known as:  EFFEXOR-XR  Take by mouth daily with breakfast. Take 1-1/2 tablets (225 mg) daily     VITAMIN D-3 PO  Take 1,000 Units by mouth 2 (two) times daily.         No Known Allergies   REVIEW OF SYSTEMS:  GENERAL: no change in appetite, no fatigue, no weight changes, no fever, chills or weakness RESPIRATORY: no cough, SOB, DOE, wheezing, hemoptysis CARDIAC: no chest pain, edema or palpitations GI: no abdominal pain, diarrhea, constipation, heart burn, nausea or vomiting  PHYSICAL EXAMINATION  GENERAL: no acute distress, obese SKIN:   Skin is warm and dry. NECK: supple, trachea midline, no neck masses, no thyroid tenderness, no thyromegaly LYMPHATICS: no LAN in the neck, no supraclavicular  LAN RESPIRATORY: breathing is even & unlabored, BS CTAB CARDIAC: RRR, no murmur,no extra heart sounds, no edema GI: abdomen soft, normal BS, no masses, no tenderness, no hepatomegaly, no splenomegaly, has umbilical hernia EXTREMITIES: limited ROM on bilateral shoulders; BLE weakness; uses sit-to-stand lift PSYCHIATRIC: the patient is alert & oriented to person, affect & behavior appropriate  LABS/RADIOLOGY: Labs reviewed: Basic Metabolic Panel:  Recent Labs  42/59/56 10/30/15 12/17/15  NA 142 139 140  K 4.1 4.1 4.1  BUN 20 22* 44*  CREATININE 0.7 0.8 1.2*   Liver Function Tests:  Recent Labs  10/12/15 10/30/15 12/17/15  AST 10* 8* 13  ALT 6* 5* 7  ALKPHOS 69 72 74    CBC:  Recent Labs  10/12/15 10/30/15 12/17/15  WBC 7.5 7.2 8.2  NEUTROABS  --  4 4  HGB 11.7* 11.8* 12.7  HCT 38 38 40  PLT 161 163 227   Lipid Panel:  Recent Labs  10/12/15  HDL 30*      ASSESSMENT/PLAN:  History of CVA - stable; continue Aggrenox 200-25 mg 1 by mouth twice a day; continue supportive care; check CBC  Depression - mood is stable; continue venlafaxine ER 225 mg by mouth daily; followed-up by [facility]  Hypertension - continue Lasix 20 mg by mouth daily, Lisinopril 40 mg by mouth daily, carvedilol to 12.5 mg by mouth twice a day, HCTZ 25 mg daily and nifedipine ER 90 mg by mouth daily; check CMP  Seizure disorder -  No reported seizure activity; continue Vimpat 50 mg by mouth every 12 hours  Diabetes mellitus, type II -    continue metformin 1000 mg by mouth daily,  Lantus 50 units subcutaneous daily at bedtime and Pioglitazone Hcl 45 mg take 1 tab PO daily;  Check HgbA1C  Vitamin D deficiency - continue Vitamin D  1000 units twice daily  Hyperlipidemia - continue Zocor 20 mg by mouth daily at bedtime; check lipid panel    Goals of care:  Long-term care    Kenard Gower, NP [facility] [phone]

## 2016-04-18 ENCOUNTER — Encounter: Payer: Self-pay | Admitting: Adult Health

## 2016-04-18 ENCOUNTER — Non-Acute Institutional Stay (SKILLED_NURSING_FACILITY): Payer: Medicare Other | Admitting: Adult Health

## 2016-04-18 DIAGNOSIS — E1142 Type 2 diabetes mellitus with diabetic polyneuropathy: Secondary | ICD-10-CM | POA: Diagnosis not present

## 2016-04-18 DIAGNOSIS — Z8673 Personal history of transient ischemic attack (TIA), and cerebral infarction without residual deficits: Secondary | ICD-10-CM

## 2016-04-18 DIAGNOSIS — F330 Major depressive disorder, recurrent, mild: Secondary | ICD-10-CM

## 2016-04-18 DIAGNOSIS — S72452A Displaced supracondylar fracture without intracondylar extension of lower end of left femur, initial encounter for closed fracture: Secondary | ICD-10-CM | POA: Diagnosis not present

## 2016-04-18 DIAGNOSIS — E559 Vitamin D deficiency, unspecified: Secondary | ICD-10-CM

## 2016-04-18 DIAGNOSIS — E785 Hyperlipidemia, unspecified: Secondary | ICD-10-CM | POA: Diagnosis not present

## 2016-04-18 DIAGNOSIS — G40909 Epilepsy, unspecified, not intractable, without status epilepticus: Secondary | ICD-10-CM

## 2016-04-18 DIAGNOSIS — I10 Essential (primary) hypertension: Secondary | ICD-10-CM | POA: Diagnosis not present

## 2016-04-18 NOTE — Progress Notes (Signed)
Patient ID: Yvette Gutierrez, female   DOB: 05-12-1946, 70 y.o.   MRN: [id number]   DATE:      04/18/16  Facility:  Nursing Home Location:  [facility] Room Number: [id number] LEVEL OF CARE:  SNF (31)  Chief Complaint  Patient presents with  . Medical Management of Chronic Illnesses    HISTORY OF PRESENT ILLNESS:  This is a 70 year old female who is being seen for a routine visit. She is long-term care resident of [facility]. She has been complaining of left knee pain. X-ray was done and showed subacute supracondylar fracture of the distal femur with moderate angulation. No redness noted on left knee.  PAST MEDICAL HISTORY:  Past Medical History  Diagnosis Date  . Allergic rhinitis   . Major depressive disorder, recurrent episode, mild (HCC)   . Diabetes mellitus type 2 with complications (HCC)   . Neuropathy due to secondary diabetes (HCC)   . HTN (hypertension)   . Obesity   . CVA (cerebral infarction) 2002    Chronic unsteadiness  . Bilateral shoulder pain     chronic-uses vicodin a few times a month for pain control  . Femur fracture, left (HCC) 09/2014    nonsurgical treatment, immobilized  . Dementia   . Hyperlipidemia   . Slow transit constipation   . Seizure disorder (HCC)   . Vitamin D deficiency     CURRENT MEDICATIONS: Reviewed per MAR/see medication list   Medication List       This list is accurate as of: 04/18/16  6:53 PM.  Always use your most recent med list.               acetaminophen 500 MG tablet  Commonly known as:  TYLENOL  Take 500 mg by mouth every 6 (six) hours as needed.     amoxicillin 500 MG tablet  Commonly known as:  AMOXIL  Take 500 mg by mouth once. Take 4 tablets po for dental appointment  Start taking on:  04/24/2016     carvedilol 12.5 MG tablet  Commonly known as:  COREG  Take 12.5 mg by mouth 2 (two) times daily with a meal.     Co-Enzyme Q10 100 MG Caps  Take 100 mg by mouth daily.     dipyridamole-aspirin 200-25 MG 12hr capsule  Commonly known as:  AGGRENOX  Take 1 capsule by mouth 2 (two) times daily.     docusate sodium 100 MG capsule  Commonly known as:  COLACE  Take 200 mg by mouth at bedtime. Take 2 capsules=200 mg PO @ HS     furosemide 20 MG tablet  Commonly known as:  LASIX  Take 1 tablet (20 mg total) by mouth daily.     hydrochlorothiazide 25 MG tablet  Commonly known as:  HYDRODIURIL  Take 25 mg by mouth daily. Hold for SBP <110     insulin glargine 100 UNIT/ML injection  Commonly known as:  LANTUS  Inject 50 Units into the skin at bedtime. Do not mix with other insulins.     lacosamide 50 MG Tabs tablet  Commonly known as:  VIMPAT  Take one tablet by mouth every 12 hours     lisinopril 40 MG tablet  Commonly known as:  PRINIVIL,ZESTRIL  Take 1 tablet (40 mg total) by mouth daily.     LORazepam 2 MG/ML concentrated solution  Commonly known as:  ATIVAN  Inject 1 mg into the muscle once. One  time order for dental procedure  Start taking on:  04/24/2016     metFORMIN 1000 MG tablet  Commonly known as:  GLUCOPHAGE  Take 1 tablet (1,000 mg total) by mouth daily with breakfast.     NIFEdipine 90 MG 24 hr tablet  Commonly known as:  PROCARDIA XL/ADALAT-CC  Take 90 mg by mouth daily.     pioglitazone 45 MG tablet  Commonly known as:  ACTOS  Take 45 mg by mouth daily.     simvastatin 20 MG tablet  Commonly known as:  ZOCOR  Take 1 tablet (20 mg total) by mouth at bedtime.     venlafaxine XR 150 MG 24 hr capsule  Commonly known as:  EFFEXOR-XR  Take by mouth daily with breakfast. Take 1-1/2 tablets (225 mg) daily     VITAMIN D-3 PO  Take 1,000 Units by mouth 2 (two) times daily.         No Known Allergies   REVIEW OF SYSTEMS:  GENERAL: no change in appetite, no fatigue, no weight changes, no fever, chills or weakness RESPIRATORY: no cough, SOB, DOE, wheezing, hemoptysis CARDIAC: no chest pain, edema or palpitations GI: no  abdominal pain, diarrhea, constipation, heart burn, nausea or vomiting  PHYSICAL EXAMINATION  GENERAL: no acute distress, obese SKIN:   Skin is warm and dry. NECK: supple, trachea midline, no neck masses, no thyroid tenderness, no thyromegaly LYMPHATICS: no LAN in the neck, no supraclavicular LAN RESPIRATORY: breathing is even & unlabored, BS CTAB CARDIAC: RRR, no murmur,no extra heart sounds, no edema GI: abdomen soft, normal BS, no masses, no tenderness, no hepatomegaly, no splenomegaly, has umbilical hernia EXTREMITIES: limited ROM on bilateral shoulders; BLE weakness; LLE tender PSYCHIATRIC: the patient is alert & oriented to person, affect & behavior appropriate  LABS/RADIOLOGY: Labs reviewed: Basic Metabolic Panel:  Recent Labs  18/84/16 10/30/15 12/17/15  NA 142 139 140  K 4.1 4.1 4.1  BUN 20 22* 44*  CREATININE 0.7 0.8 1.2*   Liver Function Tests:  Recent Labs  10/12/15 10/30/15 12/17/15  AST 10* 8* 13  ALT 6* 5* 7  ALKPHOS 69 72 74    CBC:  Recent Labs  10/12/15 10/30/15 12/17/15  WBC 7.5 7.2 8.2  NEUTROABS  --  4 4  HGB 11.7* 11.8* 12.7  HCT 38 38 40  PLT 161 163 227   Lipid Panel:  Recent Labs  10/12/15  HDL 30*      ASSESSMENT/PLAN:  Subacute supracondylar fracture of the distal femur -  Immobilize left knee and orthopedic consult  History of CVA - stable; continue Aggrenox 200-25 mg 1 by mouth twice a day; continue supporcheck tive care; CBC      Depression - mood is stable; continue venlafaxine ER 225 mg by mouth daily; followed-up by I[facility]  Hypertension - continue Lasix 20 mg by mouth daily, Lisinopril 40 mg by mouth daily, carvedilol to 12.5 mg by mouth twice a day, HCTZ 25 mg daily and nifedipine ER 90 mg by mouth daily; check CMP  Seizure disorder -  No reported seizure activity; continue Vimpat 50 mg by mouth every 12 hours  Diabetes mellitus, type II -    continue metformin 1000 mg by mouth daily,  Lantus 50 units subcutaneous daily  at bedtime and Pioglitazone Hcl 45 mg take 1 tab PO daily;  Check HgbA1C  Vitamin D deficiency - continue Vitamin D  1000 units twice daily  Hyperlipidemia - continue Zocor 20 mg by mouth daily at  bedtime; check lipid panel      Goals of care:  Long-term care    Kenard Gower, NP [facility] [phone]

## 2016-04-30 ENCOUNTER — Non-Acute Institutional Stay (SKILLED_NURSING_FACILITY): Payer: Medicare Other | Admitting: Internal Medicine

## 2016-04-30 ENCOUNTER — Encounter: Payer: Self-pay | Admitting: Internal Medicine

## 2016-04-30 DIAGNOSIS — E785 Hyperlipidemia, unspecified: Secondary | ICD-10-CM | POA: Diagnosis not present

## 2016-04-30 DIAGNOSIS — I10 Essential (primary) hypertension: Secondary | ICD-10-CM | POA: Diagnosis not present

## 2016-04-30 DIAGNOSIS — K5901 Slow transit constipation: Secondary | ICD-10-CM | POA: Diagnosis not present

## 2016-04-30 NOTE — Progress Notes (Signed)
Patient ID: Yvette Gutierrez, female   DOB: [birth year], 70 y.o.   MRN: [id number]     [facility]   PCP: Oneal Grout, MD  Code Status: Full Code  No Known Allergies  Chief Complaint  Patient presents with  . Acute Visit    constipation     HPI:  70 year old patient is seen for acute visit. She has been constipated. She has a bowel movement every 3 days. Denies any straining and blood in the stool. Currently on colace 100 mg 2 tab qhs.    Review of Systems:  Constitutional: Negative for fever Respiratory: Negative for cough, shortness of breath Cardiovascular: Negative for chest pain, palpitations, leg swelling.  Gastrointestinal: Negative for heartburn, nausea, vomiting, abdominal pain. passing flatus.  Genitourinary: Negative for dysuria Musculoskeletal: Negative for falls.  Skin: Negative for itching, rash.  Neurological: Negative for dizziness.   Past Medical History  Diagnosis Date  . Allergic rhinitis   . Major depressive disorder, recurrent episode, mild (HCC)   . Diabetes mellitus type 2 with complications (HCC)   . Neuropathy due to secondary diabetes (HCC)   . HTN (hypertension)   . Obesity   . CVA (cerebral infarction) 2002    Chronic unsteadiness  . Bilateral shoulder pain     chronic-uses vicodin a few times a month for pain control  . Femur fracture, left (HCC) 09/2014    nonsurgical treatment, immobilized  . Dementia   . Hyperlipidemia   . Slow transit constipation   . Seizure disorder (HCC)   . Vitamin D deficiency     Medications: Patient's Medications  New Prescriptions   No medications on file  Previous Medications   ACETAMINOPHEN (TYLENOL) 500 MG TABLET    Take 500 mg by mouth every 6 (six) hours.    CARVEDILOL (COREG) 12.5 MG TABLET    Take 12.5 mg by mouth 2 (two) times daily with a meal.   CHOLECALCIFEROL (VITAMIN D-3 PO)    Take 1,000 Units by mouth 2 (two) times daily.   CO-ENZYME Q10 100 MG CAPS     Take 100 mg by mouth daily.   DIPYRIDAMOLE-ASPIRIN (AGGRENOX) 200-25 MG PER 12 HR CAPSULE    Take 1 capsule by mouth 2 (two) times daily.   DOCUSATE SODIUM (COLACE) 100 MG CAPSULE    Take 200 mg by mouth at bedtime. Take 2 capsules=200 mg PO @ HS   FUROSEMIDE (LASIX) 20 MG TABLET    Take 1 tablet (20 mg total) by mouth daily.   HYDROCHLOROTHIAZIDE (HYDRODIURIL) 25 MG TABLET    Take 25 mg by mouth daily. Hold for SBP <110   INSULIN GLARGINE (LANTUS) 100 UNIT/ML INJECTION    Inject 50 Units into the skin at bedtime. Do not mix with other insulins.   LACOSAMIDE (VIMPAT) 50 MG TABS TABLET    Take one tablet by mouth every 12 hours   LISINOPRIL (PRINIVIL,ZESTRIL) 40 MG TABLET    Take 1 tablet (40 mg total) by mouth daily.   METFORMIN (GLUCOPHAGE) 1000 MG TABLET    Take 1 tablet (1,000 mg total) by mouth daily with breakfast.   NIFEDIPINE (PROCARDIA XL/ADALAT-CC) 90 MG 24 HR TABLET    Take 90 mg by mouth daily.   PIOGLITAZONE (ACTOS) 45 MG TABLET    Take 45 mg by mouth daily.   SIMVASTATIN (ZOCOR) 20 MG TABLET    Take 1 tablet (20 mg total) by mouth at bedtime.   VENLAFAXINE XR (  EFFEXOR-XR) 150 MG 24 HR CAPSULE    Take 225 mg by mouth daily with breakfast.   Modified Medications   No medications on file  Discontinued Medications   No medications on file     Physical Exam: Filed Vitals:   04/30/16 1137  BP: 106/50  Pulse: 80  Temp: 97.4 F (36.3 C)  TempSrc: Oral  Resp: 16  Height: 5' (1.524 m)  Weight: 189 lb 9.6 oz (86.002 kg)  SpO2: 99%  Body mass index is 37.03 kg/(m^2).   General- elderly female, obese, in no acute distress Head- normocephalic, atraumatic Throat- moist mucus membrane Eyes- no pallor, no icterus, no discharge, normal conjunctiva, normal sclera Neck- no cervical lymphadenopathy Cardiovascular- normal s1,s2, no murmur, trace leg edema Respiratory- bilateral clear to auscultation Abdomen- bowel sounds present, soft, non tender, umbilical hernia present, no  guarding or rigidity Musculoskeletal- able to move all 4 extremities, limited ROM of both shoulder Neurological- no focal deficit   Labs reviewed: Basic Metabolic Panel:  Recent Labs  16/10/96 10/30/15 12/17/15  NA 142 139 140  K 4.1 4.1 4.1  BUN 20 22* 44*  CREATININE 0.7 0.8 1.2*   Liver Function Tests:  Recent Labs  10/12/15 10/30/15 12/17/15  AST 10* 8* 13  ALT 6* 5* 7  ALKPHOS 69 72 74   No results for input(s): LIPASE, AMYLASE in the last 8760 hours. No results for input(s): AMMONIA in the last 8760 hours. CBC:  Recent Labs  10/12/15 10/30/15 12/17/15  WBC 7.5 7.2 8.2  NEUTROABS  --  4 4  HGB 11.7* 11.8* 12.7  HCT 38 38 40  PLT 161 163 227   Lab Results  Component Value Date   HGBA1C 10.0 12/17/2015   Lab Results  Component Value Date   TSH 1.94 12/19/2015     Assessment/Plan  Constipation Currently on colace 100 mg 2 tab daily. D/c colace and start senna s 2 tab qhs and monitor. No signs of acute abdomen on exam  HTN Mostly stable bp with all reading < 140 and today's bp reading soft. Currently on coreg 12.5 mg bid, lisinopril 40 mg daily, lasix 20 mg daily and HCTZ 25 mg daily. Monitor bp bid   Hyperlipidemia Lipid Panel     Component Value Date/Time   CHOL 132 10/12/2015   CHOL 110 08/29/2014 0431   TRIG 272* 10/12/2015   TRIG 215* 08/29/2014 0431   HDL 30* 10/12/2015   HDL 38* 08/29/2014 0431   CHOLHDL 3 10/25/2013 1122   VLDL 43* 08/29/2014 0431   VLDL 29.4 10/25/2013 1122   LDLCALC 47 10/12/2015   LDLCALC 29 08/29/2014 0431   LDLDIRECT 52.3 01/04/2010 0914   Check lipid panel. Continue zocor 20 mg daily   Labs/tests ordered: lipid panel    Oneal Grout, MD  [facility] [phone] (Monday-Friday 8 am - 5 pm) [phone] (afterhours)

## 2016-05-21 ENCOUNTER — Encounter: Payer: Self-pay | Admitting: Adult Health

## 2016-05-21 ENCOUNTER — Non-Acute Institutional Stay (SKILLED_NURSING_FACILITY): Payer: Medicare Other | Admitting: Adult Health

## 2016-05-21 DIAGNOSIS — F330 Major depressive disorder, recurrent, mild: Secondary | ICD-10-CM

## 2016-05-21 DIAGNOSIS — Z8673 Personal history of transient ischemic attack (TIA), and cerebral infarction without residual deficits: Secondary | ICD-10-CM

## 2016-05-21 DIAGNOSIS — E559 Vitamin D deficiency, unspecified: Secondary | ICD-10-CM | POA: Diagnosis not present

## 2016-05-21 DIAGNOSIS — S72452A Displaced supracondylar fracture without intracondylar extension of lower end of left femur, initial encounter for closed fracture: Secondary | ICD-10-CM

## 2016-05-21 DIAGNOSIS — G40909 Epilepsy, unspecified, not intractable, without status epilepticus: Secondary | ICD-10-CM | POA: Diagnosis not present

## 2016-05-21 DIAGNOSIS — I10 Essential (primary) hypertension: Secondary | ICD-10-CM

## 2016-05-21 DIAGNOSIS — K5901 Slow transit constipation: Secondary | ICD-10-CM

## 2016-05-21 DIAGNOSIS — E785 Hyperlipidemia, unspecified: Secondary | ICD-10-CM | POA: Diagnosis not present

## 2016-05-21 DIAGNOSIS — E1142 Type 2 diabetes mellitus with diabetic polyneuropathy: Secondary | ICD-10-CM | POA: Diagnosis not present

## 2016-05-21 NOTE — Progress Notes (Signed)
Patient ID: Yvette Gutierrez, female   DOB: June 29, 1946, 70 y.o.   MRN: [id number]   DATE:      05/21/16  Facility:  Nursing Home Location:  [facility] Room Number: 204-A LEVEL OF CARE:  SNF (31)  Chief Complaint  Patient presents with  . Medical Management of Chronic Illnesses    HISTORY OF PRESENT ILLNESS:  This is a 70 year old female who is being seen for a routine visit. She is long-term care resident of [facility]. She takes Effexor for depression and her Effexor dosage was recently decreased to 187.5 mg daily . She was seen in her room and was smiling. She voiced no complaints.    PAST MEDICAL HISTORY:  Past Medical History:  Diagnosis Date  . Allergic rhinitis   . Bilateral shoulder pain    chronic-uses vicodin a few times a month for pain control  . CVA (cerebral infarction) 2002   Chronic unsteadiness  . Dementia   . Diabetes mellitus type 2 with complications (HCC)   . Femur fracture, left (HCC) 09/2014   nonsurgical treatment, immobilized  . HTN (hypertension)   . Hyperlipidemia   . Major depressive disorder, recurrent episode, mild (HCC)   . Neuropathy due to secondary diabetes (HCC)   . Obesity   . Seizure disorder (HCC)   . Slow transit constipation   . Vitamin D deficiency     CURRENT MEDICATIONS: Reviewed per MAR/see medication list   Medication List       Accurate as of 05/21/16  7:58 PM. Always use your most recent med list.          acetaminophen 500 MG tablet Commonly known as:  TYLENOL Take 500 mg by mouth every 6 (six) hours.   carvedilol 12.5 MG tablet Commonly known as:  COREG Take 12.5 mg by mouth 2 (two) times daily with a meal.   Co-Enzyme Q10 100 MG Caps Take 100 mg by mouth daily.   dipyridamole-aspirin 200-25 MG 12hr capsule Commonly known as:  AGGRENOX Take 1 capsule by mouth 2 (two) times daily.   EFFEXOR XR PO Take 187.5 mg by mouth daily. Take a 150 mg tablet along with a 37.5 mg tablet to =  187.5 mg po qd   furosemide 20 MG tablet Commonly known as:  LASIX Take 1 tablet (20 mg total) by mouth daily.   hydrochlorothiazide 25 MG tablet Commonly known as:  HYDRODIURIL Take 25 mg by mouth daily. Hold for SBP <110   insulin glargine 100 UNIT/ML injection Commonly known as:  LANTUS Inject 50 Units into the skin at bedtime. Do not mix with other insulins.   lacosamide 50 MG Tabs tablet Commonly known as:  VIMPAT Take one tablet by mouth every 12 hours   lisinopril 40 MG tablet Commonly known as:  PRINIVIL,ZESTRIL Take 1 tablet (40 mg total) by mouth daily.   metFORMIN 1000 MG tablet Commonly known as:  GLUCOPHAGE Take 1 tablet (1,000 mg total) by mouth daily with breakfast.   NIFEdipine 90 MG 24 hr tablet Commonly known as:  PROCARDIA XL/ADALAT-CC Take 90 mg by mouth daily.   pioglitazone 45 MG tablet Commonly known as:  ACTOS Take 45 mg by mouth daily.   sennosides-docusate sodium 8.6-50 MG tablet Commonly known as:  SENOKOT-S Take 2 tablets by mouth at bedtime.   simvastatin 20 MG tablet Commonly known as:  ZOCOR Take 1 tablet (20 mg total) by mouth at bedtime.   VITAMIN D-3  PO Take 1,000 Units by mouth 2 (two) times daily.        No Known Allergies   REVIEW OF SYSTEMS:  GENERAL: no change in appetite, no fatigue, no weight changes, no fever, chills or weakness RESPIRATORY: no cough, SOB, DOE, wheezing, hemoptysis CARDIAC: no chest pain, edema or palpitations GI: no abdominal pain, diarrhea, constipation, heart burn, nausea or vomiting  PHYSICAL EXAMINATION  GENERAL: no acute distress, obese SKIN:   Skin is warm and dry. NECK: supple, trachea midline, no neck masses, no thyroid tenderness, no thyromegaly LYMPHATICS: no LAN in the neck, no supraclavicular LAN RESPIRATORY: breathing is even & unlabored, BS CTAB CARDIAC: RRR, no murmur,no extra heart sounds, no edema GI: abdomen soft, normal BS, no masses, no tenderness, no hepatomegaly, no  splenomegaly, has umbilical hernia EXTREMITIES: limited ROM on bilateral shoulders; BLE weakness; LLE tender PSYCHIATRIC: the patient is alert & oriented to person, disoriented to time and place;  affect & behavior appropriate  LABS/RADIOLOGY: Labs reviewed: 04/21/16  WBC 5.3 hemoglobin 11.5 hematocrit 38.4 MCV 98.7 platelet 166 sodium 140 K4.3 glucose 128 BUN 36 creatinine 1.03 calcium 9.7 total protein 6.8 albumin 4.16 globulin 2.6 total bilirubin 0.34 alkaline phosphatase 63 SGOT 15 SGPT 8 GFR 56.18 Basic Metabolic Panel:  Recent Labs  62/95/28 10/30/15 12/17/15  NA 142 139 140  K 4.1 4.1 4.1  BUN 20 22* 44*  CREATININE 0.7 0.8 1.2*   Liver Function Tests:  Recent Labs  10/12/15 10/30/15 12/17/15  AST 10* 8* 13  ALT 6* 5* 7  ALKPHOS 69 72 74    CBC:  Recent Labs  10/12/15 10/30/15 12/17/15  WBC 7.5 7.2 8.2  NEUTROABS  --  4 4  HGB 11.7* 11.8* 12.7  HCT 38 38 40  PLT 161 163 227   Lipid Panel:  Recent Labs  10/12/15  HDL 30*      ASSESSMENT/PLAN:  Subacute supracondylar fracture of the distal femur -  Stable @ this time; orthopedic consult was done; continue acetaminophen 500 mg 1 tab by mouth every 6 hours for pain  Constipation - continue senna S 8.6-50 mg 2 tabs by mouth daily at bedtime  History of CVA - stable; continue Aggrenox 200-25 mg 1 by mouth twice a day; continue supportive care      Depression - mood is stable; continue venlafaxine ER 187 mg by mouth daily; followed-up by [facility] NP  Hypertension - continue Lasix 20 mg by mouth daily, Lisinopril 40 mg by mouth daily, carvedilol to 12.5 mg by mouth twice a day, HCTZ 25 mg daily and nifedipine ER 90 mg by mouth daily  Seizure disorder -  No reported seizure activity; continue Vimpat 50 mg by mouth every 12 hours  Diabetes mellitus, type II -    continue metformin 1000 mg by mouth daily,  Lantus 50 units subcutaneous daily at bedtime and Pioglitazone Hcl 45 mg take 1 tab PO daily; check  hgbA1c Lab Results  Component Value Date   HGBA1C 10.0 12/17/2015    Vitamin D deficiency - continue Vitamin D3 1000 units twice daily  Hyperlipidemia - continue Zocor 20 mg by mouth daily at bedtime; check lipid panel Lab Results  Component Value Date   CHOL 132 10/12/2015   HDL 30 (A) 10/12/2015   LDLCALC 47 10/12/2015   LDLDIRECT 52.3 01/04/2010   TRIG 272 (A) 10/12/2015   CHOLHDL 3 10/25/2013        Goals of care:  Long-term care  Kenard Gower, NP [employer] [phone]

## 2016-05-22 LAB — LIPID PANEL
Cholesterol: 135 mg/dL (ref 0–200)
HDL: 33 mg/dL — AB (ref 35–70)
LDL Cholesterol: 47 mg/dL
Triglycerides: 274 mg/dL — AB (ref 40–160)

## 2016-05-22 LAB — HEMOGLOBIN A1C: HEMOGLOBIN A1C: 6.6

## 2016-06-10 LAB — HM DIABETES EYE EXAM

## 2016-06-19 ENCOUNTER — Encounter: Payer: Self-pay | Admitting: Adult Health

## 2016-06-19 ENCOUNTER — Non-Acute Institutional Stay (SKILLED_NURSING_FACILITY): Payer: Medicare Other | Admitting: Adult Health

## 2016-06-19 DIAGNOSIS — K5901 Slow transit constipation: Secondary | ICD-10-CM | POA: Diagnosis not present

## 2016-06-19 DIAGNOSIS — S72452A Displaced supracondylar fracture without intracondylar extension of lower end of left femur, initial encounter for closed fracture: Secondary | ICD-10-CM | POA: Diagnosis not present

## 2016-06-19 DIAGNOSIS — E785 Hyperlipidemia, unspecified: Secondary | ICD-10-CM | POA: Diagnosis not present

## 2016-06-19 DIAGNOSIS — I10 Essential (primary) hypertension: Secondary | ICD-10-CM

## 2016-06-19 DIAGNOSIS — Z8673 Personal history of transient ischemic attack (TIA), and cerebral infarction without residual deficits: Secondary | ICD-10-CM

## 2016-06-19 DIAGNOSIS — E118 Type 2 diabetes mellitus with unspecified complications: Secondary | ICD-10-CM

## 2016-06-19 DIAGNOSIS — F330 Major depressive disorder, recurrent, mild: Secondary | ICD-10-CM

## 2016-06-19 DIAGNOSIS — G40909 Epilepsy, unspecified, not intractable, without status epilepticus: Secondary | ICD-10-CM | POA: Diagnosis not present

## 2016-06-19 DIAGNOSIS — E559 Vitamin D deficiency, unspecified: Secondary | ICD-10-CM | POA: Diagnosis not present

## 2016-06-19 NOTE — Progress Notes (Signed)
Patient ID: Yvette Gutierrez, female   DOB: [birth year], 70 y.o.   MRN: [id number]   DATE:      06/19/16  Facility:  Nursing Home Location:  [facility] Room Number: 204-A LEVEL OF CARE:  SNF (31)  Chief Complaint  Patient presents with  . Medical Management of Chronic Illnesses    HISTORY OF PRESENT ILLNESS:  This is a 70 year old female who is being seen for a routine visit. She is long-term care resident of [facility]. Latest hgbA1c 6.6 and currently takes Glucophage, Lantus and Actos. BPs reviewed are stable - 112/71, 112/60, 122/68 and 136/64.    PAST MEDICAL HISTORY:  Past Medical History:  Diagnosis Date  . Allergic rhinitis   . Bilateral shoulder pain    chronic-uses vicodin a few times a month for pain control  . CVA (cerebral infarction) 2002   Chronic unsteadiness  . Dementia   . Diabetes mellitus type 2 with complications (HCC)   . Femur fracture, left (HCC) 09/2014   nonsurgical treatment, immobilized  . HTN (hypertension)   . Hyperlipidemia   . Major depressive disorder, recurrent episode, mild (HCC)   . Neuropathy due to secondary diabetes (HCC)   . Obesity   . Seizure disorder (HCC)   . Slow transit constipation   . Vitamin D deficiency     CURRENT MEDICATIONS: Reviewed per MAR/see medication list   Medication List       Accurate as of 06/19/16 11:59 PM. Always use your most recent med list.          acetaminophen 500 MG tablet Commonly known as:  TYLENOL Take 500 mg by mouth every 6 (six) hours.   carvedilol 12.5 MG tablet Commonly known as:  COREG Take 12.5 mg by mouth 2 (two) times daily with a meal.   Co-Enzyme Q10 100 MG Caps Take 100 mg by mouth daily.   dipyridamole-aspirin 200-25 MG 12hr capsule Commonly known as:  AGGRENOX Take 1 capsule by mouth 2 (two) times daily.   EFFEXOR XR PO Take 187.5 mg by mouth daily. Take a 150 mg tablet along with a 37.5 mg tablet to = 187.5 mg po qd   furosemide 20 MG  tablet Commonly known as:  LASIX Take 1 tablet (20 mg total) by mouth daily.   hydrochlorothiazide 25 MG tablet Commonly known as:  HYDRODIURIL Take 25 mg by mouth daily. Hold for SBP <110   insulin glargine 100 UNIT/ML injection Commonly known as:  LANTUS Inject 50 Units into the skin at bedtime. Do not mix with other insulins.   lacosamide 50 MG Tabs tablet Commonly known as:  VIMPAT Take one tablet by mouth every 12 hours   lisinopril 40 MG tablet Commonly known as:  PRINIVIL,ZESTRIL Take 1 tablet (40 mg total) by mouth daily.   metFORMIN 1000 MG tablet Commonly known as:  GLUCOPHAGE Take 1 tablet (1,000 mg total) by mouth daily with breakfast.   NIFEdipine 90 MG 24 hr tablet Commonly known as:  PROCARDIA XL/ADALAT-CC Take 90 mg by mouth daily.   pioglitazone 45 MG tablet Commonly known as:  ACTOS Take 45 mg by mouth daily.   sennosides-docusate sodium 8.6-50 MG tablet Commonly known as:  SENOKOT-S Take 2 tablets by mouth at bedtime.   simvastatin 20 MG tablet Commonly known as:  ZOCOR Take 1 tablet (20 mg total) by mouth at bedtime.   VITAMIN D-3 PO Take 1,000 Units by mouth 2 (two) times daily.  No Known Allergies   REVIEW OF SYSTEMS:  GENERAL: no change in appetite, no fatigue, no weight changes, no fever, chills or weakness RESPIRATORY: no cough, SOB, DOE, wheezing, hemoptysis CARDIAC: no chest pain, edema or palpitations GI: no abdominal pain, diarrhea, constipation, heart burn, nausea or vomiting    PHYSICAL EXAMINATION   GENERAL: no acute distress, obese SKIN:   Skin is warm and dry. NECK: supple, trachea midline, no neck masses, no thyroid tenderness, no thyromegaly LYMPHATICS: no LAN in the neck, no supraclavicular LAN RESPIRATORY: breathing is even & unlabored, BS CTAB CARDIAC: RRR, no murmur,no extra heart sounds, no edema GI: abdomen soft, normal BS, no masses, no tenderness, no hepatomegaly, no splenomegaly, has umbilical  hernia EXTREMITIES: limited ROM on bilateral shoulders; BLE weakness; LLE tender PSYCHIATRIC: the patient is alert & oriented to person, disoriented to time and place;  affect & behavior appropriate    LABS/RADIOLOGY: Labs reviewed: 04/21/16  WBC 5.3 hemoglobin 11.5 hematocrit 38.4 MCV 98.7 platelet 166 sodium 140 K4.3 glucose 128 BUN 36 creatinine 1.03 calcium 9.7 total protein 6.8 albumin 4.16 globulin 2.6 total bilirubin 0.34 alkaline phosphatase 63 SGOT 15 SGPT 8 GFR 56.18 Basic Metabolic Panel:  Recent Labs  60/45/40 10/30/15 12/17/15  NA 142 139 140  K 4.1 4.1 4.1  BUN 20 22* 44*  CREATININE 0.7 0.8 1.2*   Liver Function Tests:  Recent Labs  10/12/15 10/30/15 12/17/15  AST 10* 8* 13  ALT 6* 5* 7  ALKPHOS 69 72 74    CBC:  Recent Labs  10/12/15 10/30/15 12/17/15  WBC 7.5 7.2 8.2  NEUTROABS  --  4 4  HGB 11.7* 11.8* 12.7  HCT 38 38 40  PLT 161 163 227   Lipid Panel:  Recent Labs  10/12/15 05/22/16 1137  HDL 30* 33*      ASSESSMENT/PLAN:  Subacute supracondylar fracture of the distal femur -  Stable @ this time; orthopedic consult was done; continue acetaminophen 500 mg 1 tab by mouth every 6 hours for pain  Constipation - continue senna S 8.6-50 mg 2 tabs by mouth daily at bedtime  History of CVA - stable; continue Aggrenox 200-25 mg 1 by mouth twice a day; continue supportive care; fall precaution      Depression - mood is stable; continue venlafaxine ER 187 mg by mouth daily; followed-up by [facility] NP  Hypertension - well-controlled; continue Lasix 20 mg by mouth daily, Lisinopril 40 mg by mouth daily, carvedilol to 12.5 mg by mouth twice a day, HCTZ 25 mg daily and nifedipine ER 90 mg by mouth daily  Seizure disorder -  continue Vimpat 50 mg by mouth every 12 hours  Diabetes mellitus, type II -    Last hgbA1c 10.0 (taken 12/17/15) ; better, continue metformin 1000 mg by mouth daily,  Lantus 50 units subcutaneous daily at bedtime and  Pioglitazone Hcl 45 mg take 1 tab PO daily Lab Results  Component Value Date   HGBA1C 6.6 05/22/2016    Vitamin D deficiency - continue Vitamin D3 1000 units twice daily  Hyperlipidemia - continue Zocor 20 mg by mouth daily at bedtime Lab Results  Component Value Date   CHOL 135 05/22/2016   HDL 33 (A) 05/22/2016   LDLCALC 47 05/22/2016   LDLDIRECT 52.3 01/04/2010   TRIG 274 (A) 05/22/2016   CHOLHDL 3 10/25/2013        Goals of care:  Long-term care    Kenard Gower, NP [facility] [phone]

## 2016-07-25 ENCOUNTER — Encounter: Payer: Self-pay | Admitting: Adult Health

## 2016-07-25 ENCOUNTER — Non-Acute Institutional Stay (SKILLED_NURSING_FACILITY): Payer: Medicare Other | Admitting: Adult Health

## 2016-07-25 DIAGNOSIS — F330 Major depressive disorder, recurrent, mild: Secondary | ICD-10-CM

## 2016-07-25 DIAGNOSIS — Z8673 Personal history of transient ischemic attack (TIA), and cerebral infarction without residual deficits: Secondary | ICD-10-CM

## 2016-07-25 DIAGNOSIS — E559 Vitamin D deficiency, unspecified: Secondary | ICD-10-CM

## 2016-07-25 DIAGNOSIS — G40909 Epilepsy, unspecified, not intractable, without status epilepticus: Secondary | ICD-10-CM

## 2016-07-25 DIAGNOSIS — E118 Type 2 diabetes mellitus with unspecified complications: Secondary | ICD-10-CM | POA: Diagnosis not present

## 2016-07-25 DIAGNOSIS — I10 Essential (primary) hypertension: Secondary | ICD-10-CM

## 2016-07-25 DIAGNOSIS — K5901 Slow transit constipation: Secondary | ICD-10-CM | POA: Diagnosis not present

## 2016-07-25 DIAGNOSIS — E785 Hyperlipidemia, unspecified: Secondary | ICD-10-CM | POA: Diagnosis not present

## 2016-07-25 NOTE — Progress Notes (Signed)
Patient ID: Yvette Gutierrez, female   DOB: [birth year], 70 y.o.   MRN: [id number]   DATE:      07/25/16  Facility:  Nursing Home Location:  [facility] Room Number: 204-A LEVEL OF CARE:  SNF (31)  Chief Complaint  Patient presents with  . Medical Management of Chronic Illnesses    HISTORY OF PRESENT ILLNESS:  This is a 70 year old female who is being seen for a routine visit. She is long-term care resident of [facility]. She is currently having PT treatments for gait training and wheelchair management.   PAST MEDICAL HISTORY:  Past Medical History:  Diagnosis Date  . Allergic rhinitis   . Bilateral shoulder pain    chronic-uses vicodin a few times a month for pain control  . CVA (cerebral infarction) 2002   Chronic unsteadiness  . Dementia   . Diabetes mellitus type 2 with complications (HCC)   . Femur fracture, left (HCC) 09/2014   nonsurgical treatment, immobilized  . HTN (hypertension)   . Hyperlipidemia   . Major depressive disorder, recurrent episode, mild (HCC)   . Neuropathy due to secondary diabetes (HCC)   . Obesity   . Seizure disorder (HCC)   . Slow transit constipation   . Vitamin D deficiency     CURRENT MEDICATIONS: Reviewed per MAR/see medication list Allergies as of 07/25/2016   No Known Allergies     Medication List       Accurate as of 07/25/16 11:59 PM. Always use your most recent med list.          acetaminophen 500 MG tablet Commonly known as:  TYLENOL Take 500 mg by mouth every 6 (six) hours.   carvedilol 12.5 MG tablet Commonly known as:  COREG Take 12.5 mg by mouth 2 (two) times daily with a meal.   Co-Enzyme Q10 100 MG Caps Take 100 mg by mouth daily.   dipyridamole-aspirin 200-25 MG 12hr capsule Commonly known as:  AGGRENOX Take 1 capsule by mouth 2 (two) times daily.   EFFEXOR XR PO Take 187.5 mg by mouth daily. Take a 150 mg tablet along with a 37.5 mg tablet to = 187.5 mg po qd   furosemide  20 MG tablet Commonly known as:  LASIX Take 1 tablet (20 mg total) by mouth daily.   hydrochlorothiazide 25 MG tablet Commonly known as:  HYDRODIURIL Take 25 mg by mouth daily. Hold for SBP <110   insulin glargine 100 UNIT/ML injection Commonly known as:  LANTUS Inject 40 Units into the skin at bedtime. Do not mix with other insulins.   lacosamide 50 MG Tabs tablet Commonly known as:  VIMPAT Take one tablet by mouth every 12 hours   lisinopril 40 MG tablet Commonly known as:  PRINIVIL,ZESTRIL Take 1 tablet (40 mg total) by mouth daily.   metFORMIN 1000 MG tablet Commonly known as:  GLUCOPHAGE Take 1 tablet (1,000 mg total) by mouth daily with breakfast.   NIFEdipine 90 MG 24 hr tablet Commonly known as:  PROCARDIA XL/ADALAT-CC Take 90 mg by mouth daily.   pioglitazone 45 MG tablet Commonly known as:  ACTOS Take 45 mg by mouth daily.   sennosides-docusate sodium 8.6-50 MG tablet Commonly known as:  SENOKOT-S Take 2 tablets by mouth at bedtime.   simvastatin 20 MG tablet Commonly known as:  ZOCOR Take 1 tablet (20 mg total) by mouth at bedtime.   VITAMIN D-3 PO Take 1,000 Units by mouth 2 (two) times  daily.        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 NECK: supple, trachea midline, no neck masses, no thyroid tenderness, no thyromegaly LYMPHATICS: no LAN in the neck, no supraclavicular LAN RESPIRATORY: breathing is even & unlabored, BS CTAB CARDIAC: RRR, no murmur,no extra heart sounds, no  edema GI: abdomen soft, normal BS, no masses, no tenderness, no hepatomegaly, no splenomegaly, has umbilical hernia EXTREMITIES: limited ROM on bilateral shoulders; BLE weakness PSYCHIATRIC: the patient is alert & oriented to person, disoriented to time and place;  affect & behavior appropriate    LABS/RADIOLOGY: Labs reviewed:  Basic Metabolic Panel:  Recent Labs  16/10/96 10/30/15 12/17/15  NA 142 139 140  K 4.1 4.1 4.1  BUN 20 22* 44*  CREATININE 0.7 0.8 1.2*   Liver Function Tests:  Recent Labs  10/12/15 10/30/15 12/17/15  AST 10* 8* 13  ALT 6* 5* 7  ALKPHOS 69 72 74    CBC:  Recent Labs  10/12/15 10/30/15 12/17/15  WBC 7.5 7.2 8.2  NEUTROABS  --  4 4  HGB 11.7* 11.8* 12.7  HCT 38 38 40  PLT 161 163 227   Lipid Panel:  Recent Labs  10/12/15 05/22/16 1137  HDL 30* 33*      ASSESSMENT/PLAN:  Hypertension - well-controlled; continue Lasix 20 mg by mouth daily, Lisinopril 40 mg by mouth daily, carvedilol to 12.5 mg by mouth twice a day, HCTZ 25 mg daily and nifedipine ER 90 mg by mouth daily; check BMP  Constipation - continue senna S 8.6-50 mg 2 tabs by mouth daily at bedtime  History of CVA - stable; continue Aggrenox 200-25 mg 1 by mouth twice a day; continue supportive care; fall precaution; check CBC      Depression - mood is stable; continue venlafaxine ER 187 mg by mouth daily; followed-up by [facility] Team Psych NP  Seizure disorder -  continue Vimpat 50 mg by mouth every 12 hours  Diabetes mellitus, type II -    Last hgbA1c 10.0 (taken 12/17/15) ; better, continue metformin 1000 mg by mouth daily,  Lantus 50 units subcutaneous daily at bedtime and Pioglitazone Hcl 45 mg take 1 tab PO daily Lab Results  Component Value Date   HGBA1C 6.6 05/22/2016   Vitamin D deficiency - continue Vitamin D3 1000 units twice daily  Hyperlipidemia - continue Zocor 20 mg by mouth daily at bedtime Lab Results  Component Value Date   CHOL 135 05/22/2016   HDL 33  (A) 05/22/2016   LDLCALC 47 05/22/2016   LDLDIRECT 52.3 01/04/2010   TRIG 274 (A) 05/22/2016   CHOLHDL 3 10/25/2013        Goals of care:  Long-term care    Kenard Gower, NP [facility] [phone]

## 2016-08-26 ENCOUNTER — Non-Acute Institutional Stay (SKILLED_NURSING_FACILITY): Payer: Medicare Other

## 2016-08-26 DIAGNOSIS — I129 Hypertensive chronic kidney disease with stage 1 through stage 4 chronic kidney disease, or unspecified chronic kidney disease: Secondary | ICD-10-CM | POA: Diagnosis not present

## 2016-08-26 DIAGNOSIS — I1310 Hypertensive heart and chronic kidney disease without heart failure, with stage 1 through stage 4 chronic kidney disease, or unspecified chronic kidney disease: Secondary | ICD-10-CM | POA: Insufficient documentation

## 2016-08-26 DIAGNOSIS — N183 Chronic kidney disease, stage 3 unspecified: Secondary | ICD-10-CM

## 2016-08-26 DIAGNOSIS — E785 Hyperlipidemia, unspecified: Secondary | ICD-10-CM

## 2016-08-26 DIAGNOSIS — Z1211 Encounter for screening for malignant neoplasm of colon: Secondary | ICD-10-CM

## 2016-08-26 DIAGNOSIS — E118 Type 2 diabetes mellitus with unspecified complications: Secondary | ICD-10-CM

## 2016-08-26 DIAGNOSIS — Z794 Long term (current) use of insulin: Secondary | ICD-10-CM | POA: Diagnosis not present

## 2016-08-26 LAB — FECAL OCCULT BLOOD, GUAIAC: Fecal Occult Blood: NEGATIVE

## 2016-08-26 NOTE — Progress Notes (Signed)
Patient ID: Yvette Gutierrez, female   DOB: [birth year], 70 y.o.   MRN: [id number]     [facility]   PCP: Oneal Grout, MD  Code Status: Full Code  No Known Allergies  Chief Complaint  Patient presents with  . Medical Management of Chronic Issues    Routine Visit     HPI:  70 year old patient is seen for routine visit. She denies any concern this visit. She is out of bed and has been compliant with her medications. No fall reported. No skin concern from nursing. She has ongoing behavioral concern with her history of schizophrenia.   Review of Systems:  Constitutional: Negative for fever HENT: Negative for headache, congestion, difficulty swallowing.    Respiratory: Negative for cough, shortness of breath Cardiovascular: Negative for chest pain, palpitation Gastrointestinal: Negative for heartburn, nausea, vomiting, abdominal pain Genitourinary: Negative for dysuria Musculoskeletal: Negative for falls.  Skin: Negative for itching, rash.  Neurological: Negative for dizziness   Past Medical History:  Diagnosis Date  . Allergic rhinitis   . Bilateral shoulder pain    chronic-uses vicodin a few times a month for pain control  . CVA (cerebral infarction) 2002   Chronic unsteadiness  . Dementia   . Diabetes mellitus type 2 with complications (HCC)   . Femur fracture, left (HCC) 09/2014   nonsurgical treatment, immobilized  . HTN (hypertension)   . Hyperlipidemia   . Major depressive disorder, recurrent episode, mild (HCC)   . Neuropathy due to secondary diabetes (HCC)   . Obesity   . Seizure disorder (HCC)   . Slow transit constipation   . Vitamin D deficiency     Medications: Patient's Medications  New Prescriptions   No medications on file  Previous Medications   ACETAMINOPHEN (TYLENOL) 500 MG TABLET    Take 500 mg by mouth every 6 (six) hours.    CARVEDILOL (COREG) 12.5 MG TABLET    Take 12.5 mg by mouth 2 (two) times daily with  a meal.   CHOLECALCIFEROL (VITAMIN D-3 PO)    Take 1,000 Units by mouth 2 (two) times daily.   CO-ENZYME Q10 100 MG CAPS    Take 100 mg by mouth daily.   DIPYRIDAMOLE-ASPIRIN (AGGRENOX) 200-25 MG PER 12 HR CAPSULE    Take 1 capsule by mouth 2 (two) times daily.   FUROSEMIDE (LASIX) 20 MG TABLET    Take 1 tablet (20 mg total) by mouth daily.   HYDROCHLOROTHIAZIDE (HYDRODIURIL) 25 MG TABLET    Take 25 mg by mouth daily. Hold for SBP <110   INSULIN GLARGINE (LANTUS) 100 UNIT/ML INJECTION    Inject 50 Units into the skin at bedtime. Do not mix with other insulins.   LACOSAMIDE (VIMPAT) 50 MG TABS TABLET    Take one tablet by mouth every 12 hours   LISINOPRIL (PRINIVIL,ZESTRIL) 40 MG TABLET    Take 1 tablet (40 mg total) by mouth daily.   METFORMIN (GLUCOPHAGE) 1000 MG TABLET    Take 1 tablet (1,000 mg total) by mouth daily with breakfast.   NIFEDIPINE (PROCARDIA XL/ADALAT-CC) 90 MG 24 HR TABLET    Take 90 mg by mouth daily.   PIOGLITAZONE (ACTOS) 45 MG TABLET    Take 45 mg by mouth daily.   SENNOSIDES-DOCUSATE SODIUM (SENOKOT-S) 8.6-50 MG TABLET    Take 2 tablets by mouth at bedtime.    SIMVASTATIN (ZOCOR) 20 MG TABLET    Take 1 tablet (20 mg total) by mouth  at bedtime.   VENLAFAXINE XR (EFFEXOR-XR) 150 MG 24 HR CAPSULE    Take 150 mg by mouth daily with breakfast.  Modified Medications   No medications on file  Discontinued Medications   VENLAFAXINE HCL (EFFEXOR XR PO)    Take 187.5 mg by mouth daily. Take a 150 mg tablet along with a 37.5 mg tablet to = 187.5 mg po qd     Physical Exam: Vitals:   08/26/16 1422  BP: 127/68  Pulse: 75  Resp: 18  Temp: 97.7 F (36.5 C)  TempSrc: Oral  SpO2: 97%  Weight: 185 lb 3.2 oz (84 kg)  Height: 5' (1.524 m)  Body mass index is 36.17 kg/m.   General- elderly female, obese, in no acute distress Head- normocephalic, atraumatic Throat- moist mucus membrane Eyes- no pallor, no icterus, no discharge Neck- no cervical  lymphadenopathy Cardiovascular- normal s1,s2, no murmur, trace leg edema Respiratory- bilateral clear to auscultation Abdomen- bowel sounds present, soft, non tender, umbilical hernia present, no guarding or rigidity Musculoskeletal- able to move all 4 extremities, generalized weakness Neurological- alert and oriented to person and place but not to time   Labs reviewed: Basic Metabolic Panel:  Recent Labs  62/13/08 10/30/15 12/17/15  NA 142 139 140  K 4.1 4.1 4.1  BUN 20 22* 44*  CREATININE 0.7 0.8 1.2*   Liver Function Tests:  Recent Labs  10/12/15 10/30/15 12/17/15  AST 10* 8* 13  ALT 6* 5* 7  ALKPHOS 69 72 74   No results for input(s): LIPASE, AMYLASE in the last 8760 hours. No results for input(s): AMMONIA in the last 8760 hours. CBC:  Recent Labs  10/12/15 10/30/15 12/17/15  WBC 7.5 7.2 8.2  NEUTROABS  --  4 4  HGB 11.7* 11.8* 12.7  HCT 38 38 40  PLT 161 163 227   Lab Results  Component Value Date   HGBA1C 6.6 05/22/2016   Lab Results  Component Value Date   TSH 1.94 12/19/2015     Assessment/Plan  Colon cancer screening With her age and history of constipation, check fobt x 3 to rule out rectal bleed  Hypertensive renal disease SBP 106-134 with one reading of 143. DBP 60-83 with one reading of 58. Currently on coreg 12.5 mg bid, lisinopril 40 mg daily, nifedipine 90 mg daily, hctz 25 mg daily and lasix 20 mg daily.  Decrease hctz to 12.5 mg daily and BP remains controlled discontinue hctz given renal impairment. Monitor bp 3 days a week for 1 month for now  Dm type 2  Lab Results  Component Value Date   HGBA1C 6.6 05/22/2016   Drop in a1c from 10 to 6.6. Currently on metformin 1000 mg daily, actos 45 mg daily and lantus 50 u daily. cbg on review below 200 with some reading of 70, 78, 79 and 87 mainly in am. Decrease lantus to 40 u daily and monitor for hypoglycemia. If has low cbg reading in am, decrease lantus dosing further. Monitor for  hypoglycemia. Check a1c.   Hyperlipidemia Lipid Panel     Component Value Date/Time   CHOL 135 05/22/2016 1137   CHOL 110 08/29/2014 0431   TRIG 274 (A) 05/22/2016 1137   TRIG 215 (H) 08/29/2014 0431   HDL 33 (A) 05/22/2016 1137   HDL 38 (L) 08/29/2014 0431   CHOLHDL 3 10/25/2013 1122   VLDL 43 (H) 08/29/2014 0431   LDLCALC 47 05/22/2016 1137   LDLCALC 29 08/29/2014 0431   LDLDIRECT 52.3  01/04/2010 0914   LDL at goal. Continue zocor 20 mg daily  ckd stage 3 Monitor bmp    Oneal Grout, MD Internal Medicine [facility] [address] [place], [place] [address] Cell Phone (Monday-Friday 8 am - 5 pm): [phone] On Call: [phone] and follow prompts after 5 pm and on weekends Office Phone: [phone] Office Fax: [phone]

## 2016-09-25 ENCOUNTER — Non-Acute Institutional Stay (SKILLED_NURSING_FACILITY): Payer: Medicare Other | Admitting: Adult Health

## 2016-09-25 ENCOUNTER — Encounter: Payer: Self-pay | Admitting: Adult Health

## 2016-09-25 DIAGNOSIS — Z8673 Personal history of transient ischemic attack (TIA), and cerebral infarction without residual deficits: Secondary | ICD-10-CM

## 2016-09-25 DIAGNOSIS — F330 Major depressive disorder, recurrent, mild: Secondary | ICD-10-CM | POA: Diagnosis not present

## 2016-09-25 DIAGNOSIS — E785 Hyperlipidemia, unspecified: Secondary | ICD-10-CM

## 2016-09-25 DIAGNOSIS — E559 Vitamin D deficiency, unspecified: Secondary | ICD-10-CM

## 2016-09-25 DIAGNOSIS — Z794 Long term (current) use of insulin: Secondary | ICD-10-CM | POA: Diagnosis not present

## 2016-09-25 DIAGNOSIS — I10 Essential (primary) hypertension: Secondary | ICD-10-CM | POA: Diagnosis not present

## 2016-09-25 DIAGNOSIS — E118 Type 2 diabetes mellitus with unspecified complications: Secondary | ICD-10-CM

## 2016-09-25 DIAGNOSIS — K5901 Slow transit constipation: Secondary | ICD-10-CM

## 2016-09-25 DIAGNOSIS — G40909 Epilepsy, unspecified, not intractable, without status epilepticus: Secondary | ICD-10-CM

## 2016-09-25 NOTE — Progress Notes (Signed)
Patient ID: Yvette Gutierrez, female   DOB: August 16, 1946, 70 y.o.   MRN: [id number]   DATE:      09/25/16   Facility:  Nursing Home Location:  [facility] Room Number: 204-A LEVEL OF CARE:  SNF (31)  Chief Complaint  Patient presents with  . Medical Management of Chronic Illnesses    HISTORY OF PRESENT ILLNESS:  This is a 70 year old female who is being seen for a routine visit. She is long-term care resident of [facility]. Effexor dosage was recently decreased. HCTZ dosage was recently decreased and BPs are stable - 116/72, 112/63, 112/53, 135/73. Latest hgbA1c 6.6.   PAST MEDICAL HISTORY:  Past Medical History:  Diagnosis Date  . Allergic rhinitis   . Bilateral shoulder pain    chronic-uses vicodin a few times a month for pain control  . CVA (cerebral infarction) 2002   Chronic unsteadiness  . Dementia   . Diabetes mellitus type 2 with complications (HCC)   . Femur fracture, left (HCC) 09/2014   nonsurgical treatment, immobilized  . HTN (hypertension)   . Hyperlipidemia   . Major depressive disorder, recurrent episode, mild (HCC)   . Neuropathy due to secondary diabetes (HCC)   . Obesity   . Seizure disorder (HCC)   . Slow transit constipation   . Vitamin D deficiency     CURRENT MEDICATIONS: Reviewed per MAR/see medication list Allergies as of 09/25/2016   No Known Allergies     Medication List       Accurate as of 09/25/16  5:19 PM. Always use your most recent med list.          acetaminophen 500 MG tablet Commonly known as:  TYLENOL Take 500 mg by mouth every 6 (six) hours.   carvedilol 12.5 MG tablet Commonly known as:  COREG Take 12.5 mg by mouth 2 (two) times daily with a meal.   Co-Enzyme Q10 100 MG Caps Take 100 mg by mouth daily.   dipyridamole-aspirin 200-25 MG 12hr capsule Commonly known as:  AGGRENOX Take 1 capsule by mouth 2 (two) times daily.   furosemide 20 MG tablet Commonly known as:  LASIX Take 1  tablet (20 mg total) by mouth daily.   hydrochlorothiazide 25 MG tablet Commonly known as:  HYDRODIURIL Take 25 mg by mouth daily. Hold for SBP <110   insulin glargine 100 UNIT/ML injection Commonly known as:  LANTUS Inject 40 Units into the skin at bedtime. Do not mix with other insulins.   lacosamide 50 MG Tabs tablet Commonly known as:  VIMPAT Take one tablet by mouth every 12 hours   lisinopril 40 MG tablet Commonly known as:  PRINIVIL,ZESTRIL Take 1 tablet (40 mg total) by mouth daily.   metFORMIN 1000 MG tablet Commonly known as:  GLUCOPHAGE Take 1 tablet (1,000 mg total) by mouth daily with breakfast.   NIFEdipine 90 MG 24 hr tablet Commonly known as:  PROCARDIA XL/ADALAT-CC Take 90 mg by mouth daily.   pioglitazone 45 MG tablet Commonly known as:  ACTOS Take 45 mg by mouth daily.   sennosides-docusate sodium 8.6-50 MG tablet Commonly known as:  SENOKOT-S Take 2 tablets by mouth at bedtime.   simvastatin 20 MG tablet Commonly known as:  ZOCOR Take 1 tablet (20 mg total) by mouth at bedtime.   triamcinolone cream 0.1 % Commonly known as:  KENALOG Apply 1 application topically 2 (two) times daily. Apply to bilateral lower arms   venlafaxine XR 150  MG 24 hr capsule Commonly known as:  EFFEXOR-XR Take 150 mg by mouth daily with breakfast.   VITAMIN D-3 PO Take 1,000 Units by mouth 2 (two) times daily.        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 NECK: supple, trachea midline, no  neck masses, no thyroid tenderness, no thyromegaly LYMPHATICS: no LAN in the neck, no supraclavicular LAN RESPIRATORY: breathing is even & unlabored, BS CTAB CARDIAC: RRR, no murmur,no extra heart sounds, no edema GI: abdomen soft, normal BS, no masses, no tenderness, no hepatomegaly, no splenomegaly, has umbilical hernia EXTREMITIES: limited ROM on bilateral shoulders; BLE weakness PSYCHIATRIC: the patient is alert & oriented to person, disoriented to time and place;  affect & behavior appropriate    LABS/RADIOLOGY: Labs reviewed: 09/01/16  WBC 7.1 hemoglobin 11.8 hematocrit 36.9 MCV 91.8 platelet 189 hemoglobin A1c 6.6 sodium 142 K4.1 glucose 180 BUN 51 creatinine 1.21 total protein 6.9 albumin 4.31 and globulin 2.6 total bilirubin 0.24 alkaline phosphatase 60 SGOT 11 SGPT 7 GFR 46.6 Basic Metabolic Panel:  Recent Labs  16/10/96 10/30/15 12/17/15  NA 142 139 140  K 4.1 4.1 4.1  BUN 20 22* 44*  CREATININE 0.7 0.8 1.2*   Liver Function Tests:  Recent Labs  10/12/15 10/30/15 12/17/15  AST 10* 8* 13  ALT 6* 5* 7  ALKPHOS 69 72 74    CBC:  Recent Labs  10/12/15 10/30/15 12/17/15  WBC 7.5 7.2 8.2  NEUTROABS  --  4 4  HGB 11.7* 11.8* 12.7  HCT 38 38 40  PLT 161 163 227   Lipid Panel:  Recent Labs  10/12/15 05/22/16 1137  HDL 30* 33*      ASSESSMENT/PLAN:  History of CVA - stable; continue Aggrenox 200-25 mg 1 by mouth twice a day; continue supportive care; fall precaution  Hypertension - well-controlled; continue Lasix 20 mg by mouth daily, Lisinopril 40 mg by mouth daily, carvedilol to 12.5 mg by mouth twice a day, HCTZ 12.5 mg daily and nifedipine ER 90 mg by mouth daily  Constipation - continue senna S 8.6-50 mg 2 tabs by mouth daily at bedtime      Depression - mood is stable; continue venlafaxine ER 187 mg by mouth daily; followed-up by [facility] NP  Seizure disorder -  continue Vimpat 50 mg by mouth every 12 hours  Diabetes mellitus, type II -      hgbA1c 6.6 ; better, continue metformin 1000 mg by mouth daily,  Lantus 40 units subcutaneous daily at bedtime and Pioglitazone Hcl 45 mg take 1 tab PO daily  Vitamin D deficiency - continue Vitamin D3 1000 units twice daily  Hyperlipidemia - continue Zocor 20 mg by mouth daily at bedtime Lab Results  Component Value Date   CHOL 135 05/22/2016   HDL 33 (A) 05/22/2016   LDLCALC 47 05/22/2016   LDLDIRECT 52.3 01/04/2010   TRIG 274 (A) 05/22/2016   CHOLHDL 3 10/25/2013        Goals of care:  Long-term care    Kenard Gower, NP [facility] [phone]

## 2016-09-29 LAB — CBC AND DIFFERENTIAL
HEMATOCRIT: 38 % (ref 36–46)
Hemoglobin: 12.2 g/dL (ref 12.0–16.0)
Platelets: 295 10*3/uL (ref 150–399)
WBC: 8 10^3/mL

## 2016-10-14 LAB — BASIC METABOLIC PANEL
BUN: 38 mg/dL — AB (ref 4–21)
CREATININE: 1.3 mg/dL — AB (ref 0.5–1.1)
Glucose: 147 mg/dL
POTASSIUM: 4.2 mmol/L (ref 3.4–5.3)
Sodium: 144 mmol/L (ref 137–147)

## 2016-10-23 ENCOUNTER — Encounter: Payer: Self-pay | Admitting: Adult Health

## 2016-10-23 ENCOUNTER — Non-Acute Institutional Stay (SKILLED_NURSING_FACILITY): Payer: Medicare Other | Admitting: Adult Health

## 2016-10-23 DIAGNOSIS — Z794 Long term (current) use of insulin: Secondary | ICD-10-CM | POA: Diagnosis not present

## 2016-10-23 DIAGNOSIS — G40909 Epilepsy, unspecified, not intractable, without status epilepticus: Secondary | ICD-10-CM | POA: Diagnosis not present

## 2016-10-23 DIAGNOSIS — E118 Type 2 diabetes mellitus with unspecified complications: Secondary | ICD-10-CM

## 2016-10-23 DIAGNOSIS — I10 Essential (primary) hypertension: Secondary | ICD-10-CM

## 2016-10-23 NOTE — Progress Notes (Signed)
Patient ID: Yvette Gutierrez, female   DOB: May 28, 1946, 71 y.o.   MRN: [id number]   DATE:      10/23/16  Facility:  Nursing Home Location:  [facility] Room Number: [id number]-A LEVEL OF CARE:  SNF (31)  Chief Complaint  Patient presents with  . Medical Management of Chronic Illnesses    HISTORY OF PRESENT ILLNESS:  This is a 71 year old female who is being seen for a routine visit. She is long-term care resident of [facility]. CBGs stable - 196, 234, 168, 160, 142, 137. BPs stable - 124/62, 134/53, 126/72, 136/72.   PAST MEDICAL HISTORY:  Past Medical History:  Diagnosis Date  . Allergic rhinitis   . Bilateral shoulder pain    chronic-uses vicodin a few times a month for pain control  . CVA (cerebral infarction) 2002   Chronic unsteadiness  . Dementia   . Diabetes mellitus type 2 with complications (HCC)   . Femur fracture, left (HCC) 09/2014   nonsurgical treatment, immobilized  . HTN (hypertension)   . Hyperlipidemia   . Major depressive disorder, recurrent episode, mild (HCC)   . Neuropathy due to secondary diabetes (HCC)   . Obesity   . Seizure disorder (HCC)   . Slow transit constipation   . Vitamin D deficiency     CURRENT MEDICATIONS: Reviewed per MAR/see medication list Allergies as of 10/23/2016   No Known Allergies     Medication List       Accurate as of 10/23/16  7:26 PM. Always use your most recent med list.          acetaminophen 500 MG tablet Commonly known as:  TYLENOL Take 500 mg by mouth every 6 (six) hours.   carvedilol 12.5 MG tablet Commonly known as:  COREG Take 12.5 mg by mouth 2 (two) times daily with a meal.   Co-Enzyme Q10 100 MG Caps Take 100 mg by mouth daily.   dipyridamole-aspirin 200-25 MG 12hr capsule Commonly known as:  AGGRENOX Take 1 capsule by mouth 2 (two) times daily.   furosemide 20 MG tablet Commonly known as:  LASIX Take 1 tablet (20 mg total) by mouth daily.   hydrochlorothiazide 12.5  MG capsule Commonly known as:  MICROZIDE Take 12.5 mg by mouth daily. Hold for SBP <110   insulin glargine 100 UNIT/ML injection Commonly known as:  LANTUS Inject 40 Units into the skin at bedtime. Do not mix with other insulins.   lacosamide 50 MG Tabs tablet Commonly known as:  VIMPAT Take one tablet by mouth every 12 hours   lisinopril 40 MG tablet Commonly known as:  PRINIVIL,ZESTRIL Take 1 tablet (40 mg total) by mouth daily.   metFORMIN 1000 MG tablet Commonly known as:  GLUCOPHAGE Take 1 tablet (1,000 mg total) by mouth daily with breakfast.   NIFEdipine 90 MG 24 hr tablet Commonly known as:  PROCARDIA XL/ADALAT-CC Take 90 mg by mouth daily.   pioglitazone 45 MG tablet Commonly known as:  ACTOS Take 45 mg by mouth daily.   sennosides-docusate sodium 8.6-50 MG tablet Commonly known as:  SENOKOT-S Take 2 tablets by mouth at bedtime.   simvastatin 20 MG tablet Commonly known as:  ZOCOR Take 1 tablet (20 mg total) by mouth at bedtime.   triamcinolone cream 0.1 % Commonly known as:  KENALOG Apply 1 application topically 2 (two) times daily as needed. Apply to bilateral lower arms   venlafaxine XR 150 MG 24 hr capsule Commonly  known as:  EFFEXOR-XR Take 150 mg by mouth daily with breakfast.   VITAMIN D-3 PO Take 1,000 Units by mouth 2 (two) times daily.        No Known Allergies   REVIEW OF SYSTEMS:  GENERAL: no change in appetite, no fatigue, no weight changes, no fever, chills or weakness RESPIRATORY: no cough, SOB, DOE, wheezing, hemoptysis CARDIAC: no chest pain, edema or palpitations GI: no abdominal pain, diarrhea, constipation, heart burn, nausea or vomiting    PHYSICAL EXAMINATION   GENERAL: no acute distress, obese SKIN:   Skin is warm and dry. NECK: supple, trachea midline, no neck masses, no thyroid tenderness, no thyromegaly LYMPHATICS: no LAN in the neck, no supraclavicular LAN RESPIRATORY: breathing is even & unlabored, BS  CTAB CARDIAC: RRR, no murmur,no extra heart sounds, no edema GI: abdomen soft, normal BS, no masses, no tenderness, no hepatomegaly, no splenomegaly, has umbilical hernia EXTREMITIES: limited ROM on bilateral shoulders; BLE weakness PSYCHIATRIC: the patient is alert & oriented X 3;  affect & behavior appropriate    LABS/RADIOLOGY: Labs reviewed: 09/01/16  WBC 7.1 hemoglobin 11.8 hematocrit 36.9 MCV 91.8 platelet 189 hemoglobin A1c 6.6 sodium 142 K4.1 glucose 180 BUN 51 creatinine 1.21 total protein 6.9 albumin 4.31 and globulin 2.6 total bilirubin 0.24 alkaline phosphatase 60 SGOT 11 SGPT 7 GFR 46.6 Basic Metabolic Panel:  Recent Labs  13/08/65 12/17/15 10/14/16  NA 139 140 144  K 4.1 4.1 4.2  BUN 22* 44* 38*  CREATININE 0.8 1.2* 1.3*   Liver Function Tests:  Recent Labs  10/30/15 12/17/15  AST 8* 13  ALT 5* 7  ALKPHOS 72 74    CBC:  Recent Labs  10/30/15 12/17/15 09/29/16  WBC 7.2 8.2 8.0  NEUTROABS 4 4  --   HGB 11.8* 12.7 12.2  HCT 38 40 38  PLT 163 227 295   Lipid Panel:  Recent Labs  05/22/16 1137  HDL 33*      ASSESSMENT/PLAN:  Hypertension - well-controlled; continue Lasix 20 mg by mouth daily, Lisinopril 40 mg by mouth daily, carvedilol to 12.5 mg by mouth twice a day, HCTZ 12.5 mg daily and nifedipine ER 90 mg by mouth daily  History of CVA - stable; continue Aggrenox 200-25 mg 1 by mouth twice a day; continue supportive care; fall precaution  Seizure disorder -  no seizure episode has been reported; continue Vimpat 50 mg by mouth every 12 hours  Diabetes mellitus, type II -    continue metformin 1000 mg by mouth daily,  Lantus 40 units subcutaneous daily at bedtime and Pioglitazone Hcl 45 mg take 1 tab PO daily; CBG Q AM Lab Results  Component Value Date   HGBA1C 6.6 05/22/2016       Goals of care:  Long-term care    Kenard Gower, NP [facility] [phone]

## 2016-11-25 ENCOUNTER — Non-Acute Institutional Stay (SKILLED_NURSING_FACILITY): Payer: Medicare Other | Admitting: Adult Health

## 2016-11-25 DIAGNOSIS — F330 Major depressive disorder, recurrent, mild: Secondary | ICD-10-CM | POA: Diagnosis not present

## 2016-11-25 DIAGNOSIS — E118 Type 2 diabetes mellitus with unspecified complications: Secondary | ICD-10-CM | POA: Diagnosis not present

## 2016-11-25 DIAGNOSIS — E559 Vitamin D deficiency, unspecified: Secondary | ICD-10-CM | POA: Diagnosis not present

## 2016-11-25 DIAGNOSIS — E785 Hyperlipidemia, unspecified: Secondary | ICD-10-CM

## 2016-11-25 NOTE — Progress Notes (Signed)
DATE:  11/25/2016   MRN:  [id number]  BIRTHDAY: 02-Aug-1946  Facility:  Nursing Home Location:  [facility]  Nursing Home Room Number: 204-A  LEVEL OF CARE:  SNF (31)  Contact Information    Name Relation Home Work [place] Son [phone]     Akerson,Kami Relative [phone]         Code Status History    This patient does not have a recorded code status. Please follow your organizational policy for patients in this situation.       No chief complaint on file.   HISTORY OF PRESENT ILLNESS:  This is a 70-YO female seen for a routine visit.  She is a long-term care resident of [facility]. She was seen in her room today. She has been stable for the past month. CBGs 115, 127, 121, 116, 128, 146, 148.    PAST MEDICAL HISTORY:  Past Medical History:  Diagnosis Date  . Allergic rhinitis   . Bilateral shoulder pain    chronic-uses vicodin a few times a month for pain control  . CVA (cerebral infarction) 2002   Chronic unsteadiness  . Dementia   . Diabetes mellitus type 2 with complications (HCC)   . Femur fracture, left (HCC) 09/2014   nonsurgical treatment, immobilized  . HTN (hypertension)   . Hyperlipidemia   . Major depressive disorder, recurrent episode, mild (HCC)   . Neuropathy due to secondary diabetes (HCC)   . Obesity   . Seizure disorder (HCC)   . Slow transit constipation   . Vitamin D deficiency      CURRENT MEDICATIONS: Reviewed  Patient's Medications  New Prescriptions   No medications on file  Previous Medications   ACETAMINOPHEN (TYLENOL) 500 MG TABLET    Take 500 mg by mouth every 6 (six) hours.    CARVEDILOL (COREG) 12.5 MG TABLET    Take 12.5 mg by mouth 2 (two) times daily with a meal.   CHOLECALCIFEROL (VITAMIN D-3 PO)    Take 1,000 Units by mouth 2 (two) times daily.   CO-ENZYME Q10 100 MG CAPS    Take 100 mg by mouth daily.   DIPYRIDAMOLE-ASPIRIN (AGGRENOX) 200-25 MG PER 12 HR CAPSULE     Take 1 capsule by mouth 2 (two) times daily.   FUROSEMIDE (LASIX) 20 MG TABLET    Take 1 tablet (20 mg total) by mouth daily.   HYDROCHLOROTHIAZIDE (MICROZIDE) 12.5 MG CAPSULE    Take 12.5 mg by mouth daily. Hold for SBP <110   INSULIN GLARGINE (LANTUS) 100 UNIT/ML INJECTION    Inject 40 Units into the skin at bedtime. Do not mix with other insulins.   LACOSAMIDE (VIMPAT) 50 MG TABS TABLET    Take one tablet by mouth every 12 hours   LISINOPRIL (PRINIVIL,ZESTRIL) 40 MG TABLET    Take 1 tablet (40 mg total) by mouth daily.   METFORMIN (GLUCOPHAGE) 1000 MG TABLET    Take 1 tablet (1,000 mg total) by mouth daily with breakfast.   NIFEDIPINE (PROCARDIA XL/ADALAT-CC) 90 MG 24 HR TABLET    Take 90 mg by mouth daily.   PIOGLITAZONE (ACTOS) 45 MG TABLET    Take 45 mg by mouth daily.   SENNOSIDES-DOCUSATE SODIUM (SENOKOT-S) 8.6-50 MG TABLET    Take 2 tablets by mouth at bedtime.    SIMVASTATIN (ZOCOR) 20 MG TABLET    Take 1 tablet (20 mg total) by mouth at bedtime.   TRIAMCINOLONE  CREAM (KENALOG) 0.1 %    Apply 1 application topically 2 (two) times daily as needed. Apply to bilateral lower arms    VENLAFAXINE XR (EFFEXOR-XR) 150 MG 24 HR CAPSULE    Take 150 mg by mouth daily with breakfast.  Modified Medications   No medications on file  Discontinued Medications   No medications on file     No Known Allergies   REVIEW OF SYSTEMS:  GENERAL: no change in appetite, no fatigue, no weight changes, no fever, chills or weakness EYES: Denies change in vision, dry eyes, eye pain, itching or discharge EARS: Denies change in hearing, ringing in ears, or earache NOSE: Denies nasal congestion or epistaxis MOUTH and THROAT: Denies oral discomfort, gingival pain or bleeding, pain from teeth or hoarseness   RESPIRATORY: no cough, SOB, DOE, wheezing, hemoptysis CARDIAC: no chest pain, edema or palpitations GI: no abdominal pain, diarrhea, constipation, heart burn, nausea or vomiting GU: Denies dysuria,  frequency, hematuria, incontinence, or discharge PSYCHIATRIC: Denies feeling of depression or anxiety. No report of hallucinations, insomnia, paranoia, or agitation    PHYSICAL EXAMINATION  GENERAL APPEARANCE: Well nourished. In no acute distress. Obese SKIN:  Skin is warm and dry.  HEAD: Normal in size and contour. No evidence of trauma EYES: Lids open and close normally. No blepharitis, entropion or ectropion. PERRL. Conjunctivae are clear and sclerae are white. Lenses are without opacity EARS: Pinnae are normal. Patient hears normal voice tunes of the examiner MOUTH and THROAT: Lips are without lesions. Oral mucosa is moist and without lesions. Tongue is normal in shape, size, and color and without lesions NECK: supple, trachea midline, no neck masses, no thyroid tenderness, no thyromegaly LYMPHATICS: no LAN in the neck, no supraclavicular LAN RESPIRATORY: breathing is even & unlabored, BS CTAB CARDIAC: RRR, no murmur,no extra heart sounds, no edema GI: abdomen soft, normal BS, + abdominal hernia, no tenderness, no hepatomegaly, no splenomegaly EXTREMITIES:  Able to move BUE and BLE generalized weakness PSYCHIATRIC: Alert to self, disoriented to time and place. Affect and behavior are appropriate   LABS/RADIOLOGY: Labs reviewed: Basic Metabolic Panel:  Recent Labs  91/47/82 10/14/16  NA 140 144  K 4.1 4.2  BUN 44* 38*  CREATININE 1.2* 1.3*   Liver Function Tests:  Recent Labs  12/17/15  AST 13  ALT 7  ALKPHOS 74   CBC:  Recent Labs  12/17/15 09/29/16  WBC 8.2 8.0  NEUTROABS 4  --   HGB 12.7 12.2  HCT 40 38  PLT 227 295   Lipid Panel:  Recent Labs  05/22/16 1137  HDL 33*   ASSESSMENT/PLAN:  Hyperlipidemia -  Continue Zocor 20 mg 1 tab PO Q HS  Vitamin D deficiency -  Continue Vitamin D3 1,000 units 1` tab PO Q D  Major depression - mood is stable; continue venlafaxine ER 150 mg 1 tab by mouth daily; followed up by [facility]  Diabetes  mellitus, type II -    continue metformin 1000 mg by mouth daily,  Lantus 40 units subcutaneous daily at bedtime and Pioglitazone Hcl 45 mg take 1 tab PO daily; continue CBG Q AM Lab Results  Component Value Date   HGBA1C 6.6 05/22/2016       Goals of care:  Long-term care   [facility] [phone]

## 2016-12-31 ENCOUNTER — Encounter: Payer: Self-pay | Admitting: Internal Medicine

## 2016-12-31 ENCOUNTER — Non-Acute Institutional Stay (SKILLED_NURSING_FACILITY): Payer: Medicare Other | Admitting: Internal Medicine

## 2016-12-31 DIAGNOSIS — I1310 Hypertensive heart and chronic kidney disease without heart failure, with stage 1 through stage 4 chronic kidney disease, or unspecified chronic kidney disease: Secondary | ICD-10-CM | POA: Diagnosis not present

## 2016-12-31 DIAGNOSIS — E1122 Type 2 diabetes mellitus with diabetic chronic kidney disease: Secondary | ICD-10-CM | POA: Diagnosis not present

## 2016-12-31 DIAGNOSIS — F0150 Vascular dementia without behavioral disturbance: Secondary | ICD-10-CM

## 2016-12-31 DIAGNOSIS — Z1211 Encounter for screening for malignant neoplasm of colon: Secondary | ICD-10-CM | POA: Diagnosis not present

## 2016-12-31 DIAGNOSIS — Z794 Long term (current) use of insulin: Secondary | ICD-10-CM | POA: Diagnosis not present

## 2016-12-31 DIAGNOSIS — Z8673 Personal history of transient ischemic attack (TIA), and cerebral infarction without residual deficits: Secondary | ICD-10-CM | POA: Diagnosis not present

## 2016-12-31 DIAGNOSIS — N183 Chronic kidney disease, stage 3 (moderate): Secondary | ICD-10-CM

## 2016-12-31 DIAGNOSIS — F329 Major depressive disorder, single episode, unspecified: Secondary | ICD-10-CM | POA: Diagnosis not present

## 2016-12-31 NOTE — Progress Notes (Signed)
Patient ID: Yvette Gutierrez, female   DOB: Apr 21, 1946, 71 y.o.   MRN: [id number]     [facility]   PCP: Oneal Grout, MD  Code Status: Full Code  No Known Allergies  Chief Complaint  Patient presents with  . Medical Management of Chronic Issues    Routine Visit      HPI:  71 year old patient is seen for routine visit. She denies any concern this visit. She is out of bed most of the daysand has been compliant with her medications. No fall reported. No skin concern from nursing.   Review of Systems:  Constitutional: Negative for fever HENT: Negative for headache, congestion, difficulty swallowing.    Respiratory: Negative for cough, shortness of breath Cardiovascular: Negative for chest pain, palpitation Gastrointestinal: Negative for heartburn, nausea, vomiting, abdominal pain. She had bowel movement yesterday.  Genitourinary: Negative for dysuria Musculoskeletal: Negative for falls.  Skin: Negative for itching, rash.  Neurological: Negative for dizziness   Past Medical History:  Diagnosis Date  . Allergic rhinitis   . Bilateral shoulder pain    chronic-uses vicodin a few times a month for pain control  . CVA (cerebral infarction) 2002   Chronic unsteadiness  . Dementia   . Diabetes mellitus type 2 with complications (HCC)   . Femur fracture, left (HCC) 09/2014   nonsurgical treatment, immobilized  . HTN (hypertension)   . Hyperlipidemia   . Major depressive disorder, recurrent episode, mild (HCC)   . Neuropathy due to secondary diabetes (HCC)   . Obesity   . Seizure disorder (HCC)   . Slow transit constipation   . Vitamin D deficiency     Medications: Patient's Medications  New Prescriptions   No medications on file  Previous Medications   ACETAMINOPHEN (TYLENOL) 500 MG TABLET    Take 500 mg by mouth every 6 (six) hours.    CARVEDILOL (COREG) 12.5 MG TABLET    Take 12.5 mg by mouth 2 (two) times daily with a meal.   CHOLECALCIFEROL (VITAMIN D-3 PO)    Take 1,000 Units by mouth 2 (two) times daily.   CO-ENZYME Q10 100 MG CAPS    Take 100 mg by mouth daily.   DIPYRIDAMOLE-ASPIRIN (AGGRENOX) 200-25 MG PER 12 HR CAPSULE    Take 1 capsule by mouth 2 (two) times daily.   FUROSEMIDE (LASIX) 20 MG TABLET    Take 1 tablet (20 mg total) by mouth daily.   GUAIFENESIN (ROBITUSSIN) 100 MG/5ML LIQUID    Take by mouth every 4 (four) hours as needed for cough. Give 10 mL   HYDROCHLOROTHIAZIDE (MICROZIDE) 12.5 MG CAPSULE    Take 12.5 mg by mouth daily. Hold for SBP <110   INSULIN GLARGINE (LANTUS) 100 UNIT/ML INJECTION    Inject 40 Units into the skin at bedtime. Do not mix with other insulins.   LACOSAMIDE (VIMPAT) 50 MG TABS TABLET    Take one tablet by mouth every 12 hours   LISINOPRIL (PRINIVIL,ZESTRIL) 40 MG TABLET    Take 1 tablet (40 mg total) by mouth daily.   MENTHOL (ICY HOT) 5 % PTCH    Apply 1 patch topically at bedtime. Remove in the morning at 10 am   METFORMIN (GLUCOPHAGE) 1000 MG TABLET    Take 1 tablet (1,000 mg total) by mouth daily with breakfast.   NIFEDIPINE (PROCARDIA XL/ADALAT-CC) 90 MG 24 HR TABLET    Take 90 mg by mouth daily.   PIOGLITAZONE (ACTOS) 45 MG  TABLET    Take 45 mg by mouth daily.   SENNOSIDES-DOCUSATE SODIUM (SENOKOT-S) 8.6-50 MG TABLET    Take 2 tablets by mouth at bedtime.    SIMVASTATIN (ZOCOR) 20 MG TABLET    Take 1 tablet (20 mg total) by mouth at bedtime.   TRIAMCINOLONE CREAM (KENALOG) 0.1 %    Apply 1 application topically 2 (two) times daily as needed. Apply to bilateral lower arms    VENLAFAXINE XR (EFFEXOR-XR) 150 MG 24 HR CAPSULE    Take 187.5 mg by mouth daily with breakfast.   Modified Medications   No medications on file  Discontinued Medications   No medications on file     Physical Exam: Vitals:   12/31/16 1502  BP: 118/64  Pulse: 68  Resp: 20  Temp: 97.3 F (36.3 C)  TempSrc: Oral  Weight: 182 lb 9.6 oz (82.8 kg)  Height: 5' (1.524 m)  Body mass index is  35.66 kg/m.   General- elderly female, obese, in no acute distress Head- normocephalic, atraumatic Throat- moist mucus membrane Eyes- no pallor, no icterus, no discharge Neck- no cervical lymphadenopathy Cardiovascular- normal s1,s2, no murmur, trace leg edema Respiratory- bilateral clear to auscultation Abdomen- bowel sounds present, soft, non tender, umbilical hernia present, no guarding or rigidity Musculoskeletal- able to move all 4 extremities, generalized weakness, gets around on wheelchair Neurological- alert and oriented to self only   Labs reviewed: Basic Metabolic Panel:  Recent Labs  14/78/29  NA 144  K 4.2  BUN 38*  CREATININE 1.3*   CBC:  Recent Labs  09/29/16  WBC 8.0  HGB 12.2  HCT 38  PLT 295   Lab Results  Component Value Date   HGBA1C 6.6 05/22/2016   Lab Results  Component Value Date   TSH 1.94 12/19/2015     Assessment/Plan  Hypertensive heart and renal disease Reviewed bp reading, some low BP with several with SBP on lower side of normal range. Continue coreg 12.5 mg bid and lisinopril 40 mg daily with nifedipine and lasix. D/c hctz. No echocardiogram for review. Currently denies any dyspnea and no signs of fluid overload on exam. Obtain echocardiogram if has symptom  History of CVA Continue aggrenox bid with statin and BP meds.   Colon cancer screening With her age and history of constipation, check fobt x 3 to rule out rectal bleed. Ordered FOBT on 11/17 visit but has not been obtained yet. Continue stool softner  Chronic depression Ongoing, recently seen by psychiatry and her effexor dosing has been increased to 187.5 mg daily from 150 mg daily. Monitor mood  Dm type 2 with renal impairment Lab Results  Component Value Date   HGBA1C 6.6 05/22/2016   a1c 11/21 6.6. cbg on review mostly <150 with one to teo readings in 200s. Currently on metformin 1000 mg daily, actos 45 mg daily and lantus 40 u daily. Decrease actos to 30 mg daily  and monitor. Continue lisinopril for renal protection. Check lipid panel. Continue simvastatin 20 mg qhs.   Vascular dementia No behavior disturbance reported. Provide supportive care.   Labs- a1c, lipid panel  Oneal Grout, MD Internal Medicine [facility] [address] [place], [place] [address] Cell Phone (Monday-Friday 8 am - 5 pm): [phone] On Call: [phone] and follow prompts after 5 pm and on weekends Office Phone: [phone] Office Fax: [phone]

## 2017-01-05 LAB — LIPID PANEL
CHOLESTEROL: 127 mg/dL (ref 0–200)
HDL: 31 mg/dL — AB (ref 35–70)
LDL Cholesterol: 52 mg/dL
Triglycerides: 219 mg/dL — AB (ref 40–160)

## 2017-01-05 LAB — HEMOGLOBIN A1C: HEMOGLOBIN A1C: 6.6

## 2017-01-25 LAB — FECAL OCCULT BLOOD, GUAIAC: Fecal Occult Blood: NEGATIVE

## 2017-01-26 ENCOUNTER — Non-Acute Institutional Stay (SKILLED_NURSING_FACILITY): Payer: Medicare Other | Admitting: Adult Health

## 2017-01-26 ENCOUNTER — Encounter: Payer: Self-pay | Admitting: Adult Health

## 2017-01-26 DIAGNOSIS — S72461D Displaced supracondylar fracture with intracondylar extension of lower end of right femur, subsequent encounter for closed fracture with routine healing: Secondary | ICD-10-CM | POA: Diagnosis not present

## 2017-01-26 DIAGNOSIS — E559 Vitamin D deficiency, unspecified: Secondary | ICD-10-CM | POA: Diagnosis not present

## 2017-01-26 DIAGNOSIS — G40909 Epilepsy, unspecified, not intractable, without status epilepticus: Secondary | ICD-10-CM

## 2017-01-26 DIAGNOSIS — E785 Hyperlipidemia, unspecified: Secondary | ICD-10-CM

## 2017-01-26 NOTE — Progress Notes (Signed)
DATE:  01/26/2017   MRN:  [id number]  BIRTHDAY: Nov 05, 1945  Facility:  Nursing Home Location:  [facility]  Nursing Home Room Number: 204-A  LEVEL OF CARE:  SNF (31)  Contact Information    Name Relation Home Work Mobile   [place] Son [phone]     Nazaryan,Kami Relative [phone]         Code Status History    This patient does not have a recorded code status. Please follow your organizational policy for patients in this situation.       Chief Complaint  Patient presents with  . Medical Management of Chronic Issues    HISTORY OF PRESENT ILLNESS:  This is a 70-YO female seen for a routine visit.  She is a long-term care resident of [facility]. Patient may now bear weight on LLE and physiatry was consulted.   PAST MEDICAL HISTORY:  Past Medical History:  Diagnosis Date  . Allergic rhinitis   . Bilateral shoulder pain    chronic-uses vicodin a few times a month for pain control  . CVA (cerebral infarction) 2002   Chronic unsteadiness  . Dementia   . Diabetes mellitus type 2 with complications (HCC)   . Femur fracture, left (HCC) 09/2014   nonsurgical treatment, immobilized  . HTN (hypertension)   . Hyperlipidemia   . Major depressive disorder, recurrent episode, mild (HCC)   . Neuropathy due to secondary diabetes (HCC)   . Obesity   . Seizure disorder (HCC)   . Slow transit constipation   . Vitamin D deficiency      CURRENT MEDICATIONS: Reviewed  Patient's Medications  New Prescriptions   No medications on file  Previous Medications   ACETAMINOPHEN (TYLENOL) 500 MG TABLET    Take 500 mg by mouth every 6 (six) hours.    CARVEDILOL (COREG) 12.5 MG TABLET    Take 12.5 mg by mouth 2 (two) times daily with a meal.   CHOLECALCIFEROL (VITAMIN D-3 PO)    Take 1,000 Units by mouth 2 (two) times daily.   CO-ENZYME Q10 100 MG CAPS    Take 100 mg by mouth daily.   DIPYRIDAMOLE-ASPIRIN (AGGRENOX) 200-25 MG PER 12 HR  CAPSULE    Take 1 capsule by mouth 2 (two) times daily.   FUROSEMIDE (LASIX) 20 MG TABLET    Take 1 tablet (20 mg total) by mouth daily.   GUAIFENESIN (ROBITUSSIN) 100 MG/5ML LIQUID    Take by mouth every 4 (four) hours as needed for cough. Give 10 mL   INSULIN GLARGINE (LANTUS) 100 UNIT/ML INJECTION    Inject 40 Units into the skin at bedtime. Do not mix with other insulins.   LACOSAMIDE (VIMPAT) 50 MG TABS TABLET    Take one tablet by mouth every 12 hours   LISINOPRIL (PRINIVIL,ZESTRIL) 40 MG TABLET    Take 1 tablet (40 mg total) by mouth daily.   MENTHOL (ICY HOT) 5 % PTCH    Apply 1 patch topically at bedtime. Remove in the morning at 10 am.  Apply to lower back.   METFORMIN (GLUCOPHAGE) 1000 MG TABLET    Take 1 tablet (1,000 mg total) by mouth daily with breakfast.   NIFEDIPINE (PROCARDIA XL/ADALAT-CC) 90 MG 24 HR TABLET    Take 90 mg by mouth daily.   PIOGLITAZONE (ACTOS) 30 MG TABLET    Take 30 mg by mouth daily.   SENNOSIDES-DOCUSATE SODIUM (SENOKOT-S) 8.6-50 MG TABLET    Take 2  tablets by mouth at bedtime.    SIMVASTATIN (ZOCOR) 20 MG TABLET    Take 1 tablet (20 mg total) by mouth at bedtime.   TRIAMCINOLONE CREAM (KENALOG) 0.1 %    Apply 1 application topically 2 (two) times daily as needed. Apply to bilateral lower arms    VENLAFAXINE XR (EFFEXOR-XR) 150 MG 24 HR CAPSULE    Take 187.5 mg by mouth daily with breakfast.   Modified Medications   No medications on file  Discontinued Medications   HYDROCHLOROTHIAZIDE (MICROZIDE) 12.5 MG CAPSULE    Take 12.5 mg by mouth daily. Hold for SBP <110   PIOGLITAZONE (ACTOS) 45 MG TABLET    Take 45 mg by mouth daily.     No Known Allergies   REVIEW OF SYSTEMS:  GENERAL: no change in appetite, no fatigue, no weight changes, no fever, chills or weakness EYES: Denies change in vision, dry eyes, eye pain, itching or discharge EARS: Denies change in hearing, ringing in ears, or earache NOSE: Denies nasal congestion or epistaxis MOUTH and  THROAT: Denies oral discomfort, gingival pain or bleeding, pain from teeth or hoarseness   RESPIRATORY: no cough, SOB, DOE, wheezing, hemoptysis CARDIAC: no chest pain, edema or palpitations GI: no abdominal pain, diarrhea, constipation, heart burn, nausea or vomiting GU: Denies dysuria, frequency, hematuria, incontinence, or discharge PSYCHIATRIC: Denies feeling of depression or anxiety. No report of hallucinations, insomnia, paranoia, or agitation     PHYSICAL EXAMINATION  GENERAL APPEARANCE: Well nourished. In no acute distress. Obese SKIN:  Skin is warm and dry.  HEAD: Normal in size and contour. No evidence of trauma EYES: Lids open and close normally. No blepharitis, entropion or ectropion. PERRL. Conjunctivae are clear and sclerae are white. Lenses are without opacity EARS: Pinnae are normal. Patient hears normal voice tunes of the examiner MOUTH and THROAT: Lips are without lesions. Oral mucosa is moist and without lesions. Tongue is normal in shape, size, and color and without lesions NECK: supple, trachea midline, no neck masses, no thyroid tenderness, no thyromegaly LYMPHATICS: no LAN in the neck, no supraclavicular LAN RESPIRATORY: breathing is even & unlabored, BS CTAB CARDIAC: RRR, no murmur,no extra heart sounds, no edema GI: abdomen soft, normal BS, no masses, no tenderness, no hepatomegaly, no splenomegaly, + abdominal hernia EXTREMITIES:  Able to move X 4 extremities PSYCHIATRIC: Alert to self, disoriented to time and place.  Affect and behavior are appropriate   LABS/RADIOLOGY: Labs reviewed: Basic Metabolic Panel:  Recent Labs  25/36/64  NA 144  K 4.2  BUN 38*  CREATININE 1.3*   CBC:  Recent Labs  09/29/16  WBC 8.0  HGB 12.2  HCT 38  PLT 295   Lipid Panel:  Recent Labs  05/22/16 1137 01/05/17  HDL 33* 31*    ASSESSMENT/PLAN:  Seizure disorder - no recent seizure episode, continue Vimpat 50 mg 1 tab by mouth every 12 hours; check CBC and  BMP  Hyperlipidemia -  Continue Zocor 20 mg 1 tab PO Q HS  Vitamin D deficiency -  Continue Vitamin D3 1,000 units 1` tab PO Q D  Close displaced supracondylar fracture of distal end of left femur with routine healing - may now bear weight on LLE; fall precautions and assistance as needed     Goals of care:  Long-term care    Bransen Fassnacht C. Medina-Vargas - NP    [employer] [phone]

## 2017-01-28 LAB — BASIC METABOLIC PANEL
BUN: 47 mg/dL — AB (ref 4–21)
CREATININE: 1.1 mg/dL (ref 0.5–1.1)
Glucose: 61 mg/dL
POTASSIUM: 4.1 mmol/L (ref 3.4–5.3)
SODIUM: 141 mmol/L (ref 137–147)

## 2017-01-28 LAB — CBC AND DIFFERENTIAL
HCT: 33 % — AB (ref 36–46)
Hemoglobin: 10.4 g/dL — AB (ref 12.0–16.0)
PLATELETS: 187 10*3/uL (ref 150–399)
WBC: 7.4 10^3/mL

## 2017-02-23 ENCOUNTER — Encounter: Payer: Self-pay | Admitting: Adult Health

## 2017-02-23 ENCOUNTER — Non-Acute Institutional Stay (SKILLED_NURSING_FACILITY): Payer: Medicare Other | Admitting: Adult Health

## 2017-02-23 DIAGNOSIS — F341 Dysthymic disorder: Secondary | ICD-10-CM

## 2017-02-23 DIAGNOSIS — I10 Essential (primary) hypertension: Secondary | ICD-10-CM

## 2017-02-23 DIAGNOSIS — E1142 Type 2 diabetes mellitus with diabetic polyneuropathy: Secondary | ICD-10-CM | POA: Diagnosis not present

## 2017-02-23 DIAGNOSIS — F329 Major depressive disorder, single episode, unspecified: Secondary | ICD-10-CM

## 2017-02-23 DIAGNOSIS — Z8673 Personal history of transient ischemic attack (TIA), and cerebral infarction without residual deficits: Secondary | ICD-10-CM | POA: Diagnosis not present

## 2017-02-23 NOTE — Progress Notes (Signed)
Patient ID: Yvette Gutierrez, female   DOB: 06-Oct-1946, 71 y.o.   MRN: [id number]    DATE:  02/23/2017   MRN:  [id number]  BIRTHDAY: 1945-10-19  Facility:  Nursing Home Location:  [facility]  Nursing Home Room Number: 204-A  LEVEL OF CARE:  SNF (31)  Contact Information    Name Relation Home Work Mobile   [place] Son [phone]     Bergeron,Kami Relative [phone]         Code Status History    This patient does not have a recorded code status. Please follow your organizational policy for patients in this situation.       Chief Complaint  Patient presents with  . Medical Management of Chronic Issues    HISTORY OF PRESENT ILLNESS:  This is a 70-YO female seen for a routine visit.  She is a long-term care resident of [facility]. Lantus was recently changed to Levemir due to insurance formulary change. She was seen in the room and was happy that she is able to walk short distance in her room with assistance.     PAST MEDICAL HISTORY:  Past Medical History:  Diagnosis Date  . Allergic rhinitis   . Bilateral shoulder pain    chronic-uses vicodin a few times a month for pain control  . CVA (cerebral infarction) 2002   Chronic unsteadiness  . Dementia   . Diabetes mellitus type 2 with complications (HCC)   . Femur fracture, left (HCC) 09/2014   nonsurgical treatment, immobilized  . HTN (hypertension)   . Hyperlipidemia   . Major depressive disorder, recurrent episode, mild (HCC)   . Neuropathy due to secondary diabetes (HCC)   . Obesity   . Seizure disorder (HCC)   . Slow transit constipation   . Vitamin D deficiency      CURRENT MEDICATIONS: Reviewed  Patient's Medications  New Prescriptions   No medications on file  Previous Medications   ACETAMINOPHEN (TYLENOL) 500 MG TABLET    Take 500 mg by mouth every 6 (six) hours.    CARVEDILOL (COREG) 12.5 MG TABLET    Take 12.5 mg by mouth 2 (two) times daily with a meal.    CHOLECALCIFEROL (VITAMIN D-3 PO)    Take 1,000 Units by mouth 2 (two) times daily.   CO-ENZYME Q10 100 MG CAPS    Take 100 mg by mouth daily.   DIPYRIDAMOLE-ASPIRIN (AGGRENOX) 200-25 MG PER 12 HR CAPSULE    Take 1 capsule by mouth 2 (two) times daily.   FUROSEMIDE (LASIX) 20 MG TABLET    Take 1 tablet (20 mg total) by mouth daily.   GUAIFENESIN (ROBITUSSIN) 100 MG/5ML LIQUID    Take by mouth every 4 (four) hours as needed for cough. Give 10 mL   INSULIN GLARGINE (LANTUS) 100 UNIT/ML INJECTION    Inject 40 Units into the skin at bedtime. Do not mix with other insulins.   LACOSAMIDE (VIMPAT) 50 MG TABS TABLET    Take one tablet by mouth every 12 hours   LISINOPRIL (PRINIVIL,ZESTRIL) 40 MG TABLET    Take 1 tablet (40 mg total) by mouth daily.   MENTHOL (ICY HOT) 5 % PTCH    Apply 1 patch topically at bedtime. Remove in the morning at 10 am.  Apply to lower back.   METFORMIN (GLUCOPHAGE) 1000 MG TABLET    Take 1 tablet (1,000 mg total) by mouth daily with breakfast.   NIFEDIPINE (PROCARDIA XL/ADALAT-CC) 90 MG  24 HR TABLET    Take 90 mg by mouth daily.   PIOGLITAZONE (ACTOS) 30 MG TABLET    Take 30 mg by mouth daily.   SENNOSIDES-DOCUSATE SODIUM (SENOKOT-S) 8.6-50 MG TABLET    Take 2 tablets by mouth at bedtime.    SIMVASTATIN (ZOCOR) 20 MG TABLET    Take 1 tablet (20 mg total) by mouth at bedtime.   TRIAMCINOLONE CREAM (KENALOG) 0.1 %    Apply 1 application topically 2 (two) times daily as needed. Apply to bilateral lower arms    VENLAFAXINE XR (EFFEXOR-XR) 150 MG 24 HR CAPSULE    Take 187.5 mg by mouth daily with breakfast.   Modified Medications   No medications on file  Discontinued Medications   No medications on file     No Known Allergies   REVIEW OF SYSTEMS:  GENERAL: no change in appetite, no fatigue, no weight changes, no fever, chills or weakness EYES: Denies change in vision, dry eyes, eye pain, itching or discharge EARS: Denies change in hearing, ringing in ears, or  earache NOSE: Denies nasal congestion or epistaxis MOUTH and THROAT: Denies oral discomfort, gingival pain or bleeding, pain from teeth or hoarseness   RESPIRATORY: no cough, SOB, DOE, wheezing, hemoptysis CARDIAC: no chest pain, edema or palpitations GI: no abdominal pain, diarrhea, constipation, heart burn, nausea or vomiting GU: Denies dysuria, frequency, hematuria, incontinence, or discharge PSYCHIATRIC: Denies feeling of depression or anxiety. No report of hallucinations, insomnia, paranoia, or agitation     PHYSICAL EXAMINATION  GENERAL APPEARANCE: Well nourished. In no acute distress. Obese SKIN:  Skin is warm and dry.  HEAD: Normal in size and contour. No evidence of trauma EYES: Lids open and close normally. No blepharitis, entropion or ectropion. PERRL. Conjunctivae are clear and sclerae are white. Lenses are without opacity EARS: Pinnae are normal. Patient hears normal voice tunes of the examiner MOUTH and THROAT: Lips are without lesions. Oral mucosa is moist and without lesions. Tongue is normal in shape, size, and color and without lesions NECK: supple, trachea midline, no neck masses, no thyroid tenderness, no thyromegaly LYMPHATICS: no LAN in the neck, no supraclavicular LAN RESPIRATORY: breathing is even & unlabored, BS CTAB CARDIAC: RRR, no murmur,no extra heart sounds, no edema GI: abdomen soft, normal BS, no masses, no tenderness, no hepatomegaly, no splenomegaly, + abdominal hernia EXTREMITIES:  Able to move X 4 extremities PSYCHIATRIC: Alert to self, disoriented to time and place.  Affect and behavior are appropriate   LABS/RADIOLOGY: Labs reviewed: Basic Metabolic Panel:  Recent Labs  16/10/96 01/28/17  NA 144 141  K 4.2 4.1  BUN 38* 47*  CREATININE 1.3* 1.1   CBC:  Recent Labs  09/29/16 01/28/17  WBC 8.0 7.4  HGB 12.2 10.4*  HCT 38 33*  PLT 295 187   Lipid Panel:  Recent Labs  05/22/16 1137 01/05/17  HDL 33* 31*     ASSESSMENT/PLAN:  History of CVA - continue Aggrenox 25-200 mg 1 tab by mouth twice a day, Zocor 20 mg 1 tab by mouth daily at bedtime, Coreg 12.5 mg 1 tab by mouth twice a day, Procardia XL 90 mg 1 tab by mouth daily and lisinopril 40 mg 1 tab by mouth daily  Diabetes mellitus, type II - continue Actos 30 mg 1 tab by mouth daily and Glucophage 1000 mg 1 tab by mouth daily Lab Results  Component Value Date   HGBA1C 6.6 01/05/2017   Essential hypertension - well-controlled; continue Coreg 12.5 mg  1 tab by mouth twice a day, Lasix 20 mg 1 tab by mouth daily, Procardia XL 90 mg 1 tab by mouth daily and lisinopril 40 mg 1 tab by mouth daily  Major depression - continue venlafaxine 37.5 mg 5 tabs = 187.5 mg by mouth daily     Goals of care:  Long-term care    Shanette Tamargo C. Medina-Vargas - NP    [employer] [phone]

## 2018-05-26 ENCOUNTER — Encounter: Payer: Self-pay | Admitting: Internal Medicine

## 2021-06-15 ENCOUNTER — Inpatient Hospital Stay (HOSPITAL_COMMUNITY)
Admission: EM | Admit: 2021-06-15 | Discharge: 2021-06-18 | DRG: 481 | Disposition: A | Discharge status: Skilled Nursing Facility | Payer: Medicare Other | Source: Skilled Nursing Facility | Attending: Internal Medicine | Admitting: Internal Medicine

## 2021-06-15 ENCOUNTER — Emergency Department (HOSPITAL_COMMUNITY): Payer: Medicare Other

## 2021-06-15 ENCOUNTER — Encounter (HOSPITAL_COMMUNITY): Payer: Self-pay

## 2021-06-15 ENCOUNTER — Other Ambulatory Visit: Payer: Self-pay

## 2021-06-15 DIAGNOSIS — S7290XA Unspecified fracture of unspecified femur, initial encounter for closed fracture: Secondary | ICD-10-CM | POA: Diagnosis present

## 2021-06-15 DIAGNOSIS — Z833 Family history of diabetes mellitus: Secondary | ICD-10-CM | POA: Diagnosis not present

## 2021-06-15 DIAGNOSIS — E1142 Type 2 diabetes mellitus with diabetic polyneuropathy: Secondary | ICD-10-CM

## 2021-06-15 DIAGNOSIS — Z8249 Family history of ischemic heart disease and other diseases of the circulatory system: Secondary | ICD-10-CM

## 2021-06-15 DIAGNOSIS — Z419 Encounter for procedure for purposes other than remedying health state, unspecified: Secondary | ICD-10-CM

## 2021-06-15 DIAGNOSIS — N183 Chronic kidney disease, stage 3 unspecified: Secondary | ICD-10-CM | POA: Diagnosis present

## 2021-06-15 DIAGNOSIS — Z8673 Personal history of transient ischemic attack (TIA), and cerebral infarction without residual deficits: Secondary | ICD-10-CM

## 2021-06-15 DIAGNOSIS — M25511 Pain in right shoulder: Secondary | ICD-10-CM | POA: Diagnosis present

## 2021-06-15 DIAGNOSIS — R710 Precipitous drop in hematocrit: Secondary | ICD-10-CM | POA: Diagnosis not present

## 2021-06-15 DIAGNOSIS — F0390 Unspecified dementia without behavioral disturbance: Secondary | ICD-10-CM | POA: Diagnosis present

## 2021-06-15 DIAGNOSIS — G40909 Epilepsy, unspecified, not intractable, without status epilepticus: Secondary | ICD-10-CM | POA: Diagnosis present

## 2021-06-15 DIAGNOSIS — F32A Depression, unspecified: Secondary | ICD-10-CM | POA: Diagnosis present

## 2021-06-15 DIAGNOSIS — I129 Hypertensive chronic kidney disease with stage 1 through stage 4 chronic kidney disease, or unspecified chronic kidney disease: Secondary | ICD-10-CM | POA: Diagnosis present

## 2021-06-15 DIAGNOSIS — S7222XA Displaced subtrochanteric fracture of left femur, initial encounter for closed fracture: Secondary | ICD-10-CM | POA: Diagnosis present

## 2021-06-15 DIAGNOSIS — M25559 Pain in unspecified hip: Secondary | ICD-10-CM

## 2021-06-15 DIAGNOSIS — S7292XA Unspecified fracture of left femur, initial encounter for closed fracture: Secondary | ICD-10-CM

## 2021-06-15 DIAGNOSIS — E559 Vitamin D deficiency, unspecified: Secondary | ICD-10-CM | POA: Diagnosis present

## 2021-06-15 DIAGNOSIS — M25512 Pain in left shoulder: Secondary | ICD-10-CM | POA: Diagnosis present

## 2021-06-15 DIAGNOSIS — E785 Hyperlipidemia, unspecified: Secondary | ICD-10-CM | POA: Diagnosis present

## 2021-06-15 DIAGNOSIS — R52 Pain, unspecified: Secondary | ICD-10-CM

## 2021-06-15 DIAGNOSIS — Y92122 Bedroom in nursing home as the place of occurrence of the external cause: Secondary | ICD-10-CM | POA: Diagnosis not present

## 2021-06-15 DIAGNOSIS — G4733 Obstructive sleep apnea (adult) (pediatric): Secondary | ICD-10-CM | POA: Diagnosis present

## 2021-06-15 DIAGNOSIS — Z20822 Contact with and (suspected) exposure to covid-19: Secondary | ICD-10-CM | POA: Diagnosis present

## 2021-06-15 DIAGNOSIS — W2209XA Striking against other stationary object, initial encounter: Secondary | ICD-10-CM | POA: Diagnosis present

## 2021-06-15 DIAGNOSIS — J309 Allergic rhinitis, unspecified: Secondary | ICD-10-CM | POA: Diagnosis present

## 2021-06-15 DIAGNOSIS — I10 Essential (primary) hypertension: Secondary | ICD-10-CM

## 2021-06-15 DIAGNOSIS — Z7984 Long term (current) use of oral hypoglycemic drugs: Secondary | ICD-10-CM

## 2021-06-15 LAB — CBC WITH DIFFERENTIAL/PLATELET
Abs Immature Granulocytes: 0.08 10*3/uL — ABNORMAL HIGH (ref 0.00–0.07)
Basophils Absolute: 0 10*3/uL (ref 0.0–0.1)
Basophils Relative: 0 %
Eosinophils Absolute: 0 10*3/uL (ref 0.0–0.5)
Eosinophils Relative: 0 %
HCT: 39.2 % (ref 36.0–46.0)
Hemoglobin: 12.5 g/dL (ref 12.0–15.0)
Immature Granulocytes: 1 %
Lymphocytes Relative: 14 %
Lymphs Abs: 1.7 10*3/uL (ref 0.7–4.0)
MCH: 29.1 pg (ref 26.0–34.0)
MCHC: 31.9 g/dL (ref 30.0–36.0)
MCV: 91.4 fL (ref 80.0–100.0)
Monocytes Absolute: 0.6 10*3/uL (ref 0.1–1.0)
Monocytes Relative: 5 %
Neutro Abs: 9.8 10*3/uL — ABNORMAL HIGH (ref 1.7–7.7)
Neutrophils Relative %: 80 %
Platelets: 202 10*3/uL (ref 150–400)
RBC: 4.29 MIL/uL (ref 3.87–5.11)
RDW: 13.2 % (ref 11.5–15.5)
WBC: 12.2 10*3/uL — ABNORMAL HIGH (ref 4.0–10.5)
nRBC: 0 % (ref 0.0–0.2)

## 2021-06-15 LAB — COMPREHENSIVE METABOLIC PANEL
ALT: 23 U/L (ref 0–44)
AST: 23 U/L (ref 15–41)
Albumin: 4 g/dL (ref 3.5–5.0)
Alkaline Phosphatase: 77 U/L (ref 38–126)
Anion gap: 7 (ref 5–15)
BUN: 28 mg/dL — ABNORMAL HIGH (ref 8–23)
CO2: 24 mmol/L (ref 22–32)
Calcium: 9.5 mg/dL (ref 8.9–10.3)
Chloride: 105 mmol/L (ref 98–111)
Creatinine, Ser: 0.89 mg/dL (ref 0.44–1.00)
GFR, Estimated: >60 mL/min (ref >60)
Glucose, Bld: 247 mg/dL — ABNORMAL HIGH (ref 70–99)
Potassium: 4.9 mmol/L (ref 3.5–5.1)
Sodium: 136 mmol/L (ref 135–145)
Total Bilirubin: 0.3 mg/dL (ref 0.3–1.2)
Total Protein: 7.3 g/dL (ref 6.5–8.1)

## 2021-06-15 LAB — GLUCOSE, CAPILLARY: Glucose-Capillary: 361 mg/dL — ABNORMAL HIGH (ref 70–99)

## 2021-06-15 LAB — RESP PANEL BY RT-PCR (FLU A&B, COVID) ARPGX2
Influenza A by PCR: NEGATIVE
Influenza B by PCR: NEGATIVE
SARS Coronavirus 2 by RT PCR: NEGATIVE

## 2021-06-15 MED ORDER — LORAZEPAM 2 MG/ML IJ SOLN
1.0000 mg | Freq: Four times a day (QID) | INTRAMUSCULAR | Status: DC | PRN
  Administered 2021-06-17: 1 mg via INTRAVENOUS
  Filled 2021-06-15: qty 1

## 2021-06-15 MED ORDER — MORPHINE SULFATE (PF) 2 MG/ML IV SOLN: 2.0000 mg | INTRAVENOUS | Status: DC | PRN

## 2021-06-15 MED ORDER — LACOSAMIDE 50 MG PO TABS
50.0000 mg | ORAL_TABLET | Freq: Two times a day (BID) | ORAL | Status: DC
  Administered 2021-06-15 – 2021-06-18 (×5): 50 mg via ORAL
  Filled 2021-06-15 (×5): qty 1

## 2021-06-15 MED ORDER — ONDANSETRON HCL 4 MG/2ML IJ SOLN: 4.0000 mg | Freq: Four times a day (QID) | INTRAMUSCULAR | Status: DC | PRN

## 2021-06-15 MED ORDER — ONDANSETRON HCL 4 MG PO TABS: 4.0000 mg | ORAL_TABLET | Freq: Four times a day (QID) | ORAL | Status: DC | PRN

## 2021-06-15 MED ORDER — ACETAMINOPHEN 650 MG RE SUPP: 650.0000 mg | Freq: Four times a day (QID) | RECTAL | Status: DC | PRN

## 2021-06-15 MED ORDER — LISINOPRIL 5 MG PO TABS
2.5000 mg | ORAL_TABLET | Freq: Every day | ORAL | Status: DC
  Administered 2021-06-17 – 2021-06-18 (×2): 2.5 mg via ORAL
  Filled 2021-06-15 (×2): qty 1

## 2021-06-15 MED ORDER — ACETAMINOPHEN 325 MG PO TABS: 650.0000 mg | ORAL_TABLET | Freq: Four times a day (QID) | ORAL | Status: DC | PRN

## 2021-06-15 MED ORDER — INSULIN ASPART 100 UNIT/ML IJ SOLN
0.0000 [IU] | Freq: Every day | INTRAMUSCULAR | Status: DC
Start: 2021-06-15 — End: 2021-06-16
  Administered 2021-06-15: 5 [IU] via SUBCUTANEOUS

## 2021-06-15 MED ORDER — NIFEDIPINE ER OSMOTIC RELEASE 60 MG PO TB24: 90.0000 mg | ORAL_TABLET | Freq: Every day | ORAL | Status: DC

## 2021-06-15 MED ORDER — NIFEDIPINE ER OSMOTIC RELEASE 30 MG PO TB24
30.0000 mg | ORAL_TABLET | Freq: Every day | ORAL | Status: DC
  Administered 2021-06-17 – 2021-06-18 (×2): 30 mg via ORAL
  Filled 2021-06-15 (×3): qty 1

## 2021-06-15 MED ORDER — LISINOPRIL 20 MG PO TABS: 40.0000 mg | ORAL_TABLET | Freq: Every day | ORAL | Status: DC

## 2021-06-15 MED ORDER — CARVEDILOL 12.5 MG PO TABS
12.5000 mg | ORAL_TABLET | Freq: Two times a day (BID) | ORAL | Status: DC
  Administered 2021-06-15 – 2021-06-18 (×6): 12.5 mg via ORAL
  Filled 2021-06-15 (×6): qty 1

## 2021-06-15 MED ORDER — INSULIN ASPART 100 UNIT/ML IJ SOLN
0.0000 [IU] | Freq: Three times a day (TID) | INTRAMUSCULAR | Status: DC
  Administered 2021-06-16: 3 [IU] via SUBCUTANEOUS
  Administered 2021-06-16: 5 [IU] via SUBCUTANEOUS
  Administered 2021-06-16: 8 [IU] via SUBCUTANEOUS
  Administered 2021-06-17: 5 [IU] via SUBCUTANEOUS
  Administered 2021-06-17 (×2): 3 [IU] via SUBCUTANEOUS
  Administered 2021-06-18: 8 [IU] via SUBCUTANEOUS

## 2021-06-15 MED ORDER — INSULIN DETEMIR 100 UNIT/ML ~~LOC~~ SOLN
30.0000 [IU] | Freq: Every day | SUBCUTANEOUS | Status: DC
  Administered 2021-06-15 – 2021-06-17 (×3): 30 [IU] via SUBCUTANEOUS
  Filled 2021-06-15 (×3): qty 0.3

## 2021-06-15 MED ORDER — SIMVASTATIN 20 MG PO TABS: 20.0000 mg | ORAL_TABLET | Freq: Every day | ORAL | Status: DC

## 2021-06-15 MED ORDER — SIMVASTATIN 20 MG PO TABS
10.0000 mg | ORAL_TABLET | Freq: Every day | ORAL | Status: DC
  Administered 2021-06-15 – 2021-06-17 (×3): 10 mg via ORAL
  Filled 2021-06-15 (×3): qty 1

## 2021-06-15 MED ORDER — FENTANYL CITRATE PF 50 MCG/ML IJ SOSY
50.0000 ug | PREFILLED_SYRINGE | Freq: Once | INTRAMUSCULAR | Status: AC
  Administered 2021-06-15: 50 ug via INTRAVENOUS
  Filled 2021-06-15: qty 1

## 2021-06-15 MED ORDER — HEPARIN SODIUM (PORCINE) 5000 UNIT/ML IJ SOLN: 5000.0000 [IU] | Freq: Three times a day (TID) | INTRAMUSCULAR | Status: DC

## 2021-06-15 MED ORDER — OXYCODONE HCL 5 MG PO TABS
5.0000 mg | ORAL_TABLET | ORAL | Status: DC | PRN
  Administered 2021-06-16: 5 mg via ORAL
  Filled 2021-06-15: qty 1

## 2021-06-15 NOTE — ED Triage Notes (Addendum)
75 yo female BIBA from [facility]. Pt has history of refusing care and gets combative, tonight pt attempted to kick bedside table and broke her left hip. Per EMS the extremity is shortened and externally rotated.  No pain meds given at this time. Pt confused at baseline per ems Vitals  Bp 140/80 Hr 60 Cbg 164 Rr 16

## 2021-06-15 NOTE — Progress Notes (Signed)
Orthopedics consulted for patient's left subtrochanteric femur fracture and possible acute distal femur fracture. According to provided history, she had a history of a left distal femur fracture which was treated non-operatively. She apparently is ambulatory at baseline, but patient has dementia. Will work on clarification from skilled facility regarding this. Will get a CT for further clarification of acute distal femur fracture versus chronic non/malunion. Her subtrochanteric femur fracture requires operative fixation. Plan for possible surgical fixation tomorrow. Medicine to admit. NPO at midnight. [employer] traction ordered. Formal consultation to follow.  Alfonse Alpers, PA-C 06/15/2021

## 2021-06-15 NOTE — ED Provider Notes (Signed)
[facility]-EMERGENCY DEPT Provider Note   CSN: [id number] Arrival date & time: 06/15/21  1850     History No chief complaint on file.   Yvette Gutierrez is a 75 y.o. female.  The history is provided by the patient and medical records. No language interpreter was used.  Hip Pain This is a new problem. The current episode started less than 1 hour ago. The problem occurs constantly. The problem has not changed since onset.Pertinent negatives include no chest pain, no abdominal pain, no headaches and no shortness of breath. The symptoms are aggravated by twisting. Nothing relieves the symptoms. She has tried nothing for the symptoms. The treatment provided no relief.      Past Medical History:  Diagnosis Date   Allergic rhinitis    Bilateral shoulder pain    chronic-uses vicodin a few times a month for pain control   CVA (cerebral infarction) 2002   Chronic unsteadiness   Dementia    Diabetes mellitus type 2 with complications (HCC)    Femur fracture, left (HCC) 09/2014   nonsurgical treatment, immobilized   HTN (hypertension)    Hyperlipidemia    Major depressive disorder, recurrent episode, mild (HCC)    Neuropathy due to secondary diabetes (HCC)    Obesity    Seizure disorder (HCC)    Slow transit constipation    Vitamin D deficiency     Patient Active Problem List   Diagnosis Date Noted   History of cardioembolic cerebrovascular accident (CVA) 12/31/2016   Vascular dementia without behavioral disturbance (HCC) 12/31/2016   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   CKD (chronic kidney disease) stage 3, GFR 30-59 ml/min (HCC) 08/26/2016   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 disease, with long-term current use of insulin (HCC) 10/29/2015   Essential hypertension, benign 10/29/2015   DM type 2 with diabetic peripheral neuropathy (HCC) 02/14/2015   Major depressive disorder, recurrent episode, mild (HCC) 02/14/2015    Umbilical hernia without obstruction and without gangrene 02/14/2015   Primary osteoarthritis of both shoulders 02/14/2015   Slow transit constipation 02/14/2015   Seizure disorder (HCC) 02/14/2015   History of fall 25-Jul-2014   Death of family member 02-27-2014   Rash and nonspecific skin eruption 05/04/2013   OSA (obstructive sleep apnea) 04/06/2013   Abnormality of gait 04/05/2013   Diabetic retinopathy (HCC) 06/22/2012   Hyperlipidemia LDL goal <70 04/02/2012   Cervical cancer screening 05/28/2011   Colon cancer screening 05/28/2011   Allergic rhinitis    Neuropathy due to secondary diabetes (HCC)    CVA (cerebral infarction)    Bilateral shoulder pain    BACK PAIN 07/09/2010   VITAMIN D DEFICIENCY 10/31/2009   DEPENDENT EDEMA, LEGS, BILATERAL 10/31/2009   OBESITY, MORBID 05/14/2007   ONYCHOMYCOSIS 04/02/2007   DIABETES MELLITUS, TYPE II 03/31/2007   Major depression, chronic 03/31/2007   Essential hypertension 03/31/2007    Past Surgical History:  Procedure Laterality Date   NSVD  1968,1970,1972,1978   ventral hernia repair  1999     OB History   No obstetric history on file.     Family History  Problem Relation Age of Onset   Heart attack Father 15   Stroke Father 11   Coronary artery disease Mother    Diabetes Mother    Hypertension Mother    Stroke Mother    Dementia Mother    Diabetes Sister    Sudden death Maternal Uncle    Colon cancer  Neg Hx     Social History   Tobacco Use   Smoking status: Never   Smokeless tobacco: Never  Substance Use Topics   Alcohol use: No   Drug use: No    Home Medications Prior to Admission medications   Medication Sig Start Date End Date Taking? Authorizing Provider  acetaminophen (TYLENOL) 500 MG tablet Take 500 mg by mouth every 6 (six) hours.     [provider]  carvedilol (COREG) 12.5 MG tablet Take 12.5 mg by mouth 2 (two) times daily with a meal.    [provider]  Cholecalciferol  (VITAMIN D-3 PO) Take 1,000 Units by mouth 2 (two) times daily.    [provider]  Co-Enzyme Q10 100 MG CAPS Take 100 mg by mouth daily.    [provider]  dipyridamole-aspirin (AGGRENOX) 200-25 MG per 12 hr capsule Take 1 capsule by mouth 2 (two) times daily. 01/12/14   Joaquim Namuncan, Graham S, MD  furosemide (LASIX) 20 MG tablet Take 1 tablet (20 mg total) by mouth daily. 01/12/14   Joaquim Namuncan, Graham S, MD  guaiFENesin (ROBITUSSIN) 100 MG/5ML liquid Take by mouth every 4 (four) hours as needed for cough. Give 10 mL    [provider]  insulin glargine (LANTUS) 100 UNIT/ML injection Inject 40 Units into the skin at bedtime. Do not mix with other insulins.    [provider]  lacosamide (VIMPAT) 50 MG TABS tablet Take one tablet by mouth every 12 hours 02/18/16   Reed, Tiffany L, DO  lisinopril (PRINIVIL,ZESTRIL) 40 MG tablet Take 1 tablet (40 mg total) by mouth daily. 01/12/14   Joaquim Namuncan, Graham S, MD  Menthol (ICY HOT) 5 % [facility] Apply 1 patch topically at bedtime. Remove in the morning at 10 am.  Apply to lower back.    [provider]  metFORMIN (GLUCOPHAGE) 1000 MG tablet Take 1 tablet (1,000 mg total) by mouth daily with breakfast. 01/12/14   Joaquim Namuncan, Graham S, MD  NIFEdipine (PROCARDIA XL/ADALAT-CC) 90 MG 24 hr tablet Take 90 mg by mouth daily.    [provider]  pioglitazone (ACTOS) 30 MG tablet Take 30 mg by mouth daily.    [provider]  sennosides-docusate sodium (SENOKOT-S) 8.6-50 MG tablet Take 2 tablets by mouth at bedtime.     [provider]  simvastatin (ZOCOR) 20 MG tablet Take 1 tablet (20 mg total) by mouth at bedtime. 01/12/14   Joaquim Namuncan, Graham S, MD  triamcinolone cream (KENALOG) 0.1 % Apply 1 application topically 2 (two) times daily as needed. Apply to bilateral lower arms     [provider]  venlafaxine XR (EFFEXOR-XR) 150 MG 24 hr capsule Take 187.5 mg by mouth daily with breakfast.     [provider]     Allergies    Patient has no known allergies.  Review of Systems   Review of Systems  Constitutional:  Negative for chills, fatigue and fever.  HENT:  Negative for congestion.   Eyes:  Negative for visual disturbance.  Respiratory:  Negative for cough, chest tightness, shortness of breath and wheezing.   Cardiovascular:  Negative for chest pain, palpitations and leg swelling.  Gastrointestinal:  Negative for abdominal pain, constipation, diarrhea and vomiting.  Genitourinary:  Negative for dysuria, flank pain and frequency.  Musculoskeletal:  Negative for back pain, neck pain and neck stiffness.  Skin:  Negative for rash and wound.  Neurological:  Negative for light-headedness and headaches.  Psychiatric/Behavioral:  Negative for agitation.  All other systems reviewed and are negative.  Physical Exam Updated Vital Signs BP (!) 173/80 (BP Location: Right Arm)   Pulse 69   Temp 98 F (36.7 C) (Oral)   Resp 16   SpO2 97%   Physical Exam Vitals and nursing note reviewed.  Constitutional:      General: She is not in acute distress.    Appearance: She is well-developed. She is not ill-appearing, toxic-appearing or diaphoretic.  HENT:     Head: Normocephalic and atraumatic.     Mouth/Throat:     Mouth: Mucous membranes are moist.  Eyes:     Conjunctiva/sclera: Conjunctivae normal.  Cardiovascular:     Rate and Rhythm: Normal rate and regular rhythm.     Heart sounds: No murmur heard. Pulmonary:     Effort: Pulmonary effort is normal. No respiratory distress.     Breath sounds: Normal breath sounds. No wheezing, rhonchi or rales.  Chest:     Chest wall: No tenderness.  Abdominal:     General: Abdomen is flat.     Palpations: Abdomen is soft.     Tenderness: There is no abdominal tenderness. There is no guarding or rebound.     Hernia: No hernia is present.  Musculoskeletal:        General: Tenderness and signs of injury present.     Cervical back: Neck supple.      Left hip: Tenderness and bony tenderness present. No lacerations.       Legs:     Comments: Can wiggle feet and toes and has intact sensation and pulses distally.  Tenderness in the left hip and mid thigh area.  No laceration seen  Skin:    General: Skin is warm and dry.     Capillary Refill: Capillary refill takes less than 2 seconds.     Findings: No erythema or rash.  Neurological:     General: No focal deficit present.     Mental Status: She is alert.  Psychiatric:        Mood and Affect: Mood normal.    ED Results / Procedures / Treatments   Labs (all labs ordered are listed, but only abnormal results are displayed) Labs Reviewed  CBC WITH DIFFERENTIAL/PLATELET - Abnormal; Notable for the following components:      Result Value   WBC 12.2 (*)    Neutro Abs 9.8 (*)    Abs Immature Granulocytes 0.08 (*)    All other components within normal limits  COMPREHENSIVE METABOLIC PANEL - Abnormal; Notable for the following components:   Glucose, Bld 247 (*)    BUN 28 (*)    All other components within normal limits  GLUCOSE, CAPILLARY - Abnormal; Notable for the following components:   Glucose-Capillary 361 (*)    All other components within normal limits  RESP PANEL BY RT-PCR (FLU A&B, COVID) ARPGX2  MRSA NEXT GEN BY PCR, NASAL  COMPREHENSIVE METABOLIC PANEL  CBC  HEMOGLOBIN A1C    EKG EKG Interpretation  Date/Time:  Saturday June 15 2021 19:42:54 EDT Ventricular Rate:  74 PR Interval:  168 QRS Duration: 143 QT Interval:  425 QTC Calculation: 472 R Axis:   -71 Text Interpretation: Sinus rhythm Right bundle branch block When compared to prior, similar appernace. No STEMI Confirmed by Theda Belfast ([id number]) on 06/16/2021 12:19:32 AM  Radiology DG Chest 1 View  Result Date: 06/15/2021 CLINICAL DATA:  Combative, kicked bedside table. EXAM: CHEST  1 VIEW COMPARISON:  Chest radiograph dated 10/07/2014.  FINDINGS: The heart is enlarged. There is minimal scarring in the  left lower lung laterally, unchanged. The right lung is clear. There is no pleural effusion or pneumothorax. Degenerative changes are seen in the spine. Chronic deformities are seen of both shoulders. IMPRESSION: No acute pulmonary process. Electronically Signed   By: Romona Curls M.D.   On: 06/15/2021 19:45   DG Pelvis 1-2 Views  Result Date: 06/15/2021 CLINICAL DATA:  Broken hip after attempting to cake a bedside table. EXAM: PELVIS - 1-2 VIEW COMPARISON:  None. FINDINGS: There is an acute fracture of the subtrochanteric left femur. There is no hip dislocation. Stool overlies the rectum. IMPRESSION: Acute subtrochanteric fracture of the left femur. Electronically Signed   By: Romona Curls M.D.   On: 06/15/2021 19:41   CT KNEE LEFT WO CONTRAST  Result Date: 06/15/2021 CLINICAL DATA:  Is left hip fracture.  Abnormal x-ray. EXAM: CT OF THE left KNEE WITHOUT CONTRAST TECHNIQUE: Multidetector CT imaging of the left knee was performed according to the standard protocol. Multiplanar CT image reconstructions were also generated. COMPARISON:  Left femur radiographs 06/15/2021 FINDINGS: Bones/Joint/Cartilage Remote healed comminuted distal femur fracture again noted. An acute fracture through the healed component is nondisplaced, best seen on the coronal images. This is through the healed bone mass connecting the non fractured femoral shaft to the femoral condyles. Ligaments Suboptimally assessed by CT. Muscles and Tendons Unremarkable. Soft tissues No other soft tissue injury. IMPRESSION: 1. Remote healed comminuted distal femur fracture. 2. Acute nondisplaced fracture through the healed component of the distal femur fracture. Electronically Signed   By: Marin Roberts M.D.   On: 06/15/2021 21:29   DG FEMUR MIN 2 VIEWS LEFT  Result Date: 06/15/2021 CLINICAL DATA:  Broken hip after attempting to cake a bedside table. EXAM: LEFT FEMUR 2 VIEWS COMPARISON:  None. FINDINGS: There is an acute fracture of the  subtrochanteric left femur. There is a comminuted fracture of the distal femur which appears to involve the intercondylar region and extend to the knee joint, however this is incompletely characterized given limited views of the fracture. IMPRESSION: 1.  Acute subtrochanteric fracture of the left femur. 2. Acute distal left femoral fracture appears to involve the intercondylar region and extend to the knee joint. Dedicated knee radiographs could be helpful for further characterization. Electronically Signed   By: Romona Curls M.D.   On: 06/15/2021 19:43    Procedures Procedures   Medications Ordered in ED Medications  carvedilol (COREG) tablet 12.5 mg (12.5 mg Oral Given 06/15/21 2346)  lacosamide (VIMPAT) tablet 50 mg (50 mg Oral Given 06/15/21 2345)  heparin injection 5,000 Units (has no administration in time range)  acetaminophen (TYLENOL) tablet 650 mg (has no administration in time range)    Or  acetaminophen (TYLENOL) suppository 650 mg (has no administration in time range)  oxyCODONE (Oxy IR/ROXICODONE) immediate release tablet 5 mg (has no administration in time range)  morphine 2 MG/ML injection 2 mg (has no administration in time range)  ondansetron (ZOFRAN) tablet 4 mg (has no administration in time range)    Or  ondansetron (ZOFRAN) injection 4 mg (has no administration in time range)  LORazepam (ATIVAN) injection 1 mg (has no administration in time range)  insulin aspart (novoLOG) injection 0-15 Units (has no administration in time range)  insulin detemir (LEVEMIR) injection 30 Units (30 Units Subcutaneous Given 06/15/21 2347)  insulin aspart (novoLOG) injection 0-5 Units (5 Units Subcutaneous Given 06/15/21 2346)  NIFEdipine (PROCARDIA-XL/NIFEDICAL-XL) 24 hr tablet  30 mg (has no administration in time range)  lisinopril (ZESTRIL) tablet 2.5 mg (has no administration in time range)  simvastatin (ZOCOR) tablet 10 mg (10 mg Oral Given 06/15/21 2345)  fentaNYL (SUBLIMAZE) injection 50 mcg  (50 mcg Intravenous Given 06/15/21 1957)    ED Course  I have reviewed the triage vital signs and the nursing notes.  Pertinent labs & imaging results that were available during my care of the patient were reviewed by me and considered in my medical decision making (see chart for details).    MDM Rules/Calculators/A&P                           Yvette Gutierrez is a 75 y.o. female with a past medical history significant for hypertension, prior stroke, sleep apnea, diabetes, seizures, dementia, and previous left femur fracture who presents with left leg injury.  According to EMS, patient can get agitated and somewhat violent when care is provided to her.  He reports that the caregivers were trying to clean or move her and she kicked out with her left leg and inadvertently kicked a table and heard a loud crack.  Patient then was having severe pain in her left hip/femur area and it was shortened and rotated.  Patient was screaming in pain and was brought in for evaluation.  Otherwise, EMS reports he had no other preceding symptoms including recent fevers, chills, does not, cough, urinary changes or infectious symptoms before.  On my initial evaluation, patient does not remember what happened and is not complaining of pain currently.  She is reportedly confused at her baseline.  She also denies any preceding symptoms.  On exam, patient has tenderness in her left hip area and her left mid thigh.  She has intact sensation, strength, and pulses distally.  She was able to wiggle her toes.  No laceration seen.  Abdomen otherwise nontender.  Lungs clear.  Patient is currently pleasant while resting.  We will get x-rays of the left femur/left pelvis/hip area to determine if she has injuries.  If there is a fracture as clinically suspected, will get x-ray and other preadmission labs.  Anticipate reassessment after imaging.   8:39 PM On reassessment, patient was having significant pain.  Fentanyl  ordered.  X-ray revealed a proximal shaft left femoral fracture that appears spiral like.  The distal abnormalities appear old when compared to prior.  I spoke with radiology on the phone who looked at this.  Orthopedics was called who request patient be n.p.o. at midnight and they will see the patient to discuss if she is appropriate to repair the fracture in the morning.  Patient tells me that she does ambulate but it is unclear what her ambulatory baseline status is.  Due to her complex other medical problems and severe dementia, will call medicine for admission and medical clearance and orthopedics will see.   Final Clinical Impression(s) / ED Diagnoses Final diagnoses:  Pain  Closed fracture of left femur, unspecified fracture morphology, unspecified portion of femur, initial encounter (HCC)     Clinical Impression: 1. Closed fracture of left femur, unspecified fracture morphology, unspecified portion of femur, initial encounter (HCC)   2. Pain   3. Hip pain     Disposition: Admit  This note was prepared with assistance of Dragon voice recognition software. Occasional wrong-word or sound-a-like substitutions may have occurred due to the inherent limitations of voice recognition software.     Lipa Knauff,  Canary Brim, MD 06/16/21 Rich Fuchs
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
## 2021-06-16 ENCOUNTER — Encounter (HOSPITAL_COMMUNITY)
Admission: EM | Disposition: A | Discharge status: Skilled Nursing Facility | Payer: Self-pay | Source: Skilled Nursing Facility | Attending: Student in an Organized Health Care Education/Training Program

## 2021-06-16 ENCOUNTER — Inpatient Hospital Stay (HOSPITAL_COMMUNITY): Payer: Medicare Other | Admitting: Certified Registered Nurse Anesthetist

## 2021-06-16 ENCOUNTER — Inpatient Hospital Stay (HOSPITAL_COMMUNITY): Payer: Medicare Other

## 2021-06-16 DIAGNOSIS — S7292XA Unspecified fracture of left femur, initial encounter for closed fracture: Secondary | ICD-10-CM

## 2021-06-16 HISTORY — PX: FEMUR IM NAIL: SHX1597

## 2021-06-16 LAB — GLUCOSE, CAPILLARY
Glucose-Capillary: 272 mg/dL — ABNORMAL HIGH (ref 70–99)
Glucose-Capillary: 190 mg/dL — ABNORMAL HIGH (ref 70–99)
Glucose-Capillary: 235 mg/dL — ABNORMAL HIGH (ref 70–99)
Glucose-Capillary: 242 mg/dL — ABNORMAL HIGH (ref 70–99)

## 2021-06-16 LAB — COMPREHENSIVE METABOLIC PANEL
ALT: 24 U/L (ref 0–44)
AST: 31 U/L (ref 15–41)
Albumin: 3.7 g/dL (ref 3.5–5.0)
Alkaline Phosphatase: 70 U/L (ref 38–126)
Anion gap: 11 (ref 5–15)
BUN: 28 mg/dL — ABNORMAL HIGH (ref 8–23)
CO2: 18 mmol/L — ABNORMAL LOW (ref 22–32)
Calcium: 9.9 mg/dL (ref 8.9–10.3)
Chloride: 110 mmol/L (ref 98–111)
Creatinine, Ser: 0.78 mg/dL (ref 0.44–1.00)
GFR, Estimated: >60 mL/min (ref >60)
Glucose, Bld: 303 mg/dL — ABNORMAL HIGH (ref 70–99)
Potassium: 5 mmol/L (ref 3.5–5.1)
Sodium: 139 mmol/L (ref 135–145)
Total Bilirubin: 0.5 mg/dL (ref 0.3–1.2)
Total Protein: 6.9 g/dL (ref 6.5–8.1)

## 2021-06-16 LAB — CBC
HCT: 37.1 % (ref 36.0–46.0)
Hemoglobin: 11.8 g/dL — ABNORMAL LOW (ref 12.0–15.0)
MCH: 28.9 pg (ref 26.0–34.0)
MCHC: 31.8 g/dL (ref 30.0–36.0)
MCV: 90.7 fL (ref 80.0–100.0)
Platelets: 170 10*3/uL (ref 150–400)
RBC: 4.09 MIL/uL (ref 3.87–5.11)
RDW: 13.3 % (ref 11.5–15.5)
WBC: 9.4 10*3/uL (ref 4.0–10.5)
nRBC: 0 % (ref 0.0–0.2)

## 2021-06-16 LAB — MRSA NEXT GEN BY PCR, NASAL: MRSA by PCR Next Gen: NOT DETECTED

## 2021-06-16 SURGERY — INSERTION, INTRAMEDULLARY ROD, FEMUR
Anesthesia: General | Site: Hip | Laterality: Left

## 2021-06-16 SURGERY — INSERTION, INTRAMEDULLARY ROD, FEMUR
Anesthesia: Choice | Laterality: Left

## 2021-06-16 MED ORDER — PROPOFOL 10 MG/ML IV BOLUS
INTRAVENOUS | Status: AC
  Filled 2021-06-16: qty 20

## 2021-06-16 MED ORDER — CEFAZOLIN SODIUM-DEXTROSE 2-4 GM/100ML-% IV SOLN
2.0000 g | Freq: Once | INTRAVENOUS | Status: AC
  Administered 2021-06-16: 2 g via INTRAVENOUS

## 2021-06-16 MED ORDER — POLYETHYLENE GLYCOL 3350 17 G PO PACK
17.0000 g | PACK | Freq: Two times a day (BID) | ORAL | Status: DC
  Administered 2021-06-16 – 2021-06-18 (×4): 17 g via ORAL
  Filled 2021-06-16 (×4): qty 1

## 2021-06-16 MED ORDER — MORPHINE SULFATE (PF) 4 MG/ML IV SOLN: 0.5000 mg | INTRAVENOUS | Status: DC | PRN

## 2021-06-16 MED ORDER — FERROUS SULFATE 325 (65 FE) MG PO TABS
325.0000 mg | ORAL_TABLET | Freq: Three times a day (TID) | ORAL | Status: DC
  Administered 2021-06-16 – 2021-06-18 (×6): 325 mg via ORAL
  Filled 2021-06-16 (×6): qty 1

## 2021-06-16 MED ORDER — SUGAMMADEX SODIUM 200 MG/2ML IV SOLN
INTRAVENOUS | Status: DC | PRN
  Administered 2021-06-16: 200 mg via INTRAVENOUS

## 2021-06-16 MED ORDER — FENTANYL CITRATE (PF) 100 MCG/2ML IJ SOLN
INTRAMUSCULAR | Status: DC | PRN
  Administered 2021-06-16: 50 ug via INTRAVENOUS
  Administered 2021-06-16: 100 ug via INTRAVENOUS

## 2021-06-16 MED ORDER — POLYETHYLENE GLYCOL 3350 17 G PO PACK: 17.0000 g | PACK | Freq: Every day | ORAL | Status: DC | PRN

## 2021-06-16 MED ORDER — ROCURONIUM BROMIDE 10 MG/ML (PF) SYRINGE
PREFILLED_SYRINGE | INTRAVENOUS | Status: AC
  Filled 2021-06-16: qty 10

## 2021-06-16 MED ORDER — ACETAMINOPHEN 500 MG PO TABS
500.0000 mg | ORAL_TABLET | Freq: Four times a day (QID) | ORAL | Status: AC
  Administered 2021-06-16 – 2021-06-17 (×4): 500 mg via ORAL
  Filled 2021-06-16 (×4): qty 1

## 2021-06-16 MED ORDER — STERILE WATER FOR IRRIGATION IR SOLN
Status: DC | PRN
  Administered 2021-06-16: 2000 mL

## 2021-06-16 MED ORDER — MENTHOL 3 MG MT LOZG: 1.0000 | LOZENGE | OROMUCOSAL | Status: DC | PRN

## 2021-06-16 MED ORDER — AMISULPRIDE (ANTIEMETIC) 5 MG/2ML IV SOLN: 10.0000 mg | Freq: Once | INTRAVENOUS | Status: DC | PRN

## 2021-06-16 MED ORDER — FENTANYL CITRATE PF 50 MCG/ML IJ SOSY
PREFILLED_SYRINGE | INTRAMUSCULAR | Status: AC
  Filled 2021-06-16: qty 2

## 2021-06-16 MED ORDER — HYDROCODONE-ACETAMINOPHEN 5-325 MG PO TABS
1.0000 | ORAL_TABLET | Freq: Four times a day (QID) | ORAL | Status: DC | PRN
  Administered 2021-06-17 – 2021-06-18 (×3): 1 via ORAL
  Filled 2021-06-16 (×4): qty 1

## 2021-06-16 MED ORDER — PHENOL 1.4 % MT LIQD: 1.0000 | OROMUCOSAL | Status: DC | PRN

## 2021-06-16 MED ORDER — HYDROCODONE-ACETAMINOPHEN 7.5-325 MG PO TABS: 1.0000 | ORAL_TABLET | ORAL | Status: DC | PRN

## 2021-06-16 MED ORDER — FLEET ENEMA 7-19 GM/118ML RE ENEM: 1.0000 | ENEMA | Freq: Once | RECTAL | Status: DC | PRN

## 2021-06-16 MED ORDER — ACETAMINOPHEN 10 MG/ML IV SOLN
1000.0000 mg | Freq: Once | INTRAVENOUS | Status: DC | PRN
  Administered 2021-06-16: 1000 mg via INTRAVENOUS

## 2021-06-16 MED ORDER — ENOXAPARIN SODIUM 40 MG/0.4ML IJ SOSY
40.0000 mg | PREFILLED_SYRINGE | INTRAMUSCULAR | Status: DC
  Administered 2021-06-17 – 2021-06-18 (×2): 40 mg via SUBCUTANEOUS
  Filled 2021-06-16 (×2): qty 0.4

## 2021-06-16 MED ORDER — HYDROCODONE-ACETAMINOPHEN 5-325 MG PO TABS: 1.0000 | ORAL_TABLET | Freq: Four times a day (QID) | ORAL | Status: DC

## 2021-06-16 MED ORDER — CEFAZOLIN SODIUM-DEXTROSE 2-4 GM/100ML-% IV SOLN
INTRAVENOUS | Status: AC
  Filled 2021-06-16: qty 100

## 2021-06-16 MED ORDER — SENNA 8.6 MG PO TABS
1.0000 | ORAL_TABLET | Freq: Two times a day (BID) | ORAL | Status: DC
  Administered 2021-06-16 – 2021-06-18 (×4): 8.6 mg via ORAL
  Filled 2021-06-16 (×4): qty 1

## 2021-06-16 MED ORDER — PROPOFOL 10 MG/ML IV BOLUS
INTRAVENOUS | Status: DC | PRN
  Administered 2021-06-16: 100 mg via INTRAVENOUS

## 2021-06-16 MED ORDER — DEXAMETHASONE SODIUM PHOSPHATE 10 MG/ML IJ SOLN
INTRAMUSCULAR | Status: DC | PRN
  Administered 2021-06-16: 5 mg via INTRAVENOUS

## 2021-06-16 MED ORDER — POTASSIUM CHLORIDE IN NACL 20-0.45 MEQ/L-% IV SOLN
INTRAVENOUS | Status: DC
  Filled 2021-06-16 (×4): qty 1000

## 2021-06-16 MED ORDER — ONDANSETRON HCL 4 MG/2ML IJ SOLN: 4.0000 mg | Freq: Once | INTRAMUSCULAR | Status: DC | PRN

## 2021-06-16 MED ORDER — LIDOCAINE 2% (20 MG/ML) 5 ML SYRINGE
INTRAMUSCULAR | Status: AC
  Filled 2021-06-16: qty 5

## 2021-06-16 MED ORDER — TRANEXAMIC ACID-NACL 1000-0.7 MG/100ML-% IV SOLN
1000.0000 mg | Freq: Once | INTRAVENOUS | Status: AC
  Administered 2021-06-16: 1000 mg via INTRAVENOUS
  Filled 2021-06-16: qty 100

## 2021-06-16 MED ORDER — ROCURONIUM BROMIDE 10 MG/ML (PF) SYRINGE
PREFILLED_SYRINGE | INTRAVENOUS | Status: DC | PRN
  Administered 2021-06-16 (×3): 10 mg via INTRAVENOUS
  Administered 2021-06-16: 40 mg via INTRAVENOUS
  Administered 2021-06-16: 10 mg via INTRAVENOUS

## 2021-06-16 MED ORDER — LACTATED RINGERS IV SOLN: INTRAVENOUS | Status: DC | PRN

## 2021-06-16 MED ORDER — ACETAMINOPHEN 10 MG/ML IV SOLN
INTRAVENOUS | Status: AC
  Filled 2021-06-16: qty 100

## 2021-06-16 MED ORDER — DOCUSATE SODIUM 100 MG PO CAPS: 100.0000 mg | ORAL_CAPSULE | Freq: Two times a day (BID) | ORAL | Status: DC

## 2021-06-16 MED ORDER — DEXAMETHASONE SODIUM PHOSPHATE 10 MG/ML IJ SOLN
INTRAMUSCULAR | Status: AC
  Filled 2021-06-16: qty 1

## 2021-06-16 MED ORDER — CEFAZOLIN SODIUM-DEXTROSE 2-4 GM/100ML-% IV SOLN
2.0000 g | Freq: Four times a day (QID) | INTRAVENOUS | Status: AC
Start: 2021-06-16 — End: 2021-06-16
  Administered 2021-06-16 (×2): 2 g via INTRAVENOUS
  Filled 2021-06-16 (×2): qty 100

## 2021-06-16 MED ORDER — FENTANYL CITRATE PF 50 MCG/ML IJ SOSY
25.0000 ug | PREFILLED_SYRINGE | INTRAMUSCULAR | Status: DC | PRN
  Administered 2021-06-16: 50 ug via INTRAVENOUS

## 2021-06-16 MED ORDER — PHENYLEPHRINE HCL-NACL 20-0.9 MG/250ML-% IV SOLN
INTRAVENOUS | Status: DC | PRN
Start: 2021-06-16 — End: 2021-06-16
  Administered 2021-06-16: 35 ug/min via INTRAVENOUS

## 2021-06-16 MED ORDER — ONDANSETRON HCL 4 MG/2ML IJ SOLN
INTRAMUSCULAR | Status: DC | PRN
  Administered 2021-06-16: 4 mg via INTRAVENOUS

## 2021-06-16 MED ORDER — ACETAMINOPHEN 325 MG PO TABS: 325.0000 mg | ORAL_TABLET | Freq: Four times a day (QID) | ORAL | Status: DC | PRN

## 2021-06-16 MED ORDER — FENTANYL CITRATE (PF) 100 MCG/2ML IJ SOLN
INTRAMUSCULAR | Status: AC
  Filled 2021-06-16: qty 2

## 2021-06-16 MED ORDER — LIDOCAINE 2% (20 MG/ML) 5 ML SYRINGE
INTRAMUSCULAR | Status: DC | PRN
  Administered 2021-06-16: 60 mg via INTRAVENOUS

## 2021-06-16 MED ORDER — PHENYLEPHRINE 40 MCG/ML (10ML) SYRINGE FOR IV PUSH (FOR BLOOD PRESSURE SUPPORT)
PREFILLED_SYRINGE | INTRAVENOUS | Status: DC | PRN
  Administered 2021-06-16: 120 ug via INTRAVENOUS

## 2021-06-16 MED ORDER — 0.9 % SODIUM CHLORIDE (POUR BTL) OPTIME
TOPICAL | Status: DC | PRN
  Administered 2021-06-16: 1000 mL

## 2021-06-16 MED ORDER — BISACODYL 10 MG RE SUPP: 10.0000 mg | Freq: Every day | RECTAL | Status: DC | PRN

## 2021-06-16 MED ORDER — ONDANSETRON HCL 4 MG/2ML IJ SOLN
INTRAMUSCULAR | Status: AC
  Filled 2021-06-16: qty 2

## 2021-06-16 MED ORDER — ALUM & MAG HYDROXIDE-SIMETH 200-200-20 MG/5ML PO SUSP: 30.0000 mL | ORAL | Status: DC | PRN

## 2021-06-16 MED ORDER — PHENYLEPHRINE HCL-NACL 20-0.9 MG/250ML-% IV SOLN
INTRAVENOUS | Status: AC
  Filled 2021-06-16: qty 250

## 2021-06-16 MED ORDER — HYDROCODONE-ACETAMINOPHEN 5-325 MG PO TABS: 1.0000 | ORAL_TABLET | ORAL | Status: DC | PRN

## 2021-06-16 SURGICAL SUPPLY — 43 items
APL SKNCLS STERI-STRIP NONHPOA (GAUZE/BANDAGES/DRESSINGS) ×1
BAG COUNTER SPONGE SURGICOUNT (BAG) ×2 IMPLANT
BAG SPNG CNTER NS LX DISP (BAG) ×1
BENZOIN TINCTURE PRP APPL 2/3 (GAUZE/BANDAGES/DRESSINGS) ×2 IMPLANT
BIT DRILL CALIBRATED 4.2 (BIT) IMPLANT
BIT DRILL CANN 16 HIP (BIT) ×1 IMPLANT
BIT DRILL CANN STP 6/9 HIP (BIT) ×1 IMPLANT
BIT DRILL TAPERED 10 (BIT) ×1 IMPLANT
BLADE TFNA HELICAL 85 (Anchor) ×1 IMPLANT
BNDG COHESIVE 6X5 TAN ST LF (GAUZE/BANDAGES/DRESSINGS) ×2 IMPLANT
CLSR STERI-STRIP ANTIMIC 1/2X4 (GAUZE/BANDAGES/DRESSINGS) ×2 IMPLANT
COVER PERINEAL POST (MISCELLANEOUS) ×2 IMPLANT
COVER SURGICAL LIGHT HANDLE (MISCELLANEOUS) ×2 IMPLANT
DRAPE STERI IOBAN 125X83 (DRAPES) ×2 IMPLANT
DRESSING MEPILEX FLEX 4X4 (GAUZE/BANDAGES/DRESSINGS) ×2 IMPLANT
DRILL BIT CALIBRATED 4.2 (BIT) ×2
DRSG MEPILEX BORDER 4X12 (GAUZE/BANDAGES/DRESSINGS) ×1 IMPLANT
DRSG MEPILEX BORDER 4X8 (GAUZE/BANDAGES/DRESSINGS) ×1 IMPLANT
DRSG MEPILEX FLEX 4X4 (GAUZE/BANDAGES/DRESSINGS) ×2
DURAPREP 26ML APPLICATOR (WOUND CARE) ×2 IMPLANT
ELECT REM PT RETURN 15FT ADLT (MISCELLANEOUS) ×2 IMPLANT
GAUZE XEROFORM 5X9 LF (GAUZE/BANDAGES/DRESSINGS) ×2 IMPLANT
GLOVE SURG ENC MOIS LTX SZ6.5 (GLOVE) ×4 IMPLANT
GLOVE SURG ENC MOIS LTX SZ7.5 (GLOVE) ×2 IMPLANT
GLOVE SURG UNDER LTX SZ8 (GLOVE) ×2 IMPLANT
GLOVE SURG UNDER POLY LF SZ7 (GLOVE) ×2 IMPLANT
GOWN STRL REUS W/ TWL LRG LVL3 (GOWN DISPOSABLE) ×2 IMPLANT
GOWN STRL REUS W/TWL LRG LVL3 (GOWN DISPOSABLE) ×4
GUIDEWIRE 3.2X400 (WIRE) ×1 IMPLANT
KIT BASIN OR (CUSTOM PROCEDURE TRAY) ×2 IMPLANT
MANIFOLD NEPTUNE II (INSTRUMENTS) ×2 IMPLANT
NAIL CANN TFNA 10X130X180 LT (Nail) ×1 IMPLANT
NS IRRIG 1000ML POUR BTL (IV SOLUTION) ×2 IMPLANT
PACK GENERAL/GYN (CUSTOM PROCEDURE TRAY) ×2 IMPLANT
PAD ARMBOARD 7.5X6 YLW CONV (MISCELLANEOUS) ×4 IMPLANT
REAMER ROD DEEP FLUTE 2.5X950 (INSTRUMENTS) ×1 IMPLANT
SCREW LOCK IM TI 5X30 STRL (Screw) ×1 IMPLANT
SUT VIC AB 0 CT1 27 (SUTURE) ×2
SUT VIC AB 0 CT1 27XBRD ANBCTR (SUTURE) ×1 IMPLANT
SUT VIC AB 3-0 SH 27 (SUTURE) ×4
SUT VIC AB 3-0 SH 27X BRD (SUTURE) ×2 IMPLANT
TOWEL OR 17X26 10 PK STRL BLUE (TOWEL DISPOSABLE) ×2 IMPLANT
WATER STERILE IRR 1000ML POUR (IV SOLUTION) ×4 IMPLANT

## 2021-06-16 NOTE — Anesthesia Preprocedure Evaluation (Addendum)
Anesthesia Evaluation  Patient identified by MRN, date of birth, ID band Patient confused    Reviewed: Allergy & Precautions, NPO status , Patient's Chart, lab work & pertinent test results  Airway Mallampati: III  TM Distance: >3 FB Neck ROM: Full    Dental  (+) Chipped, Missing   Pulmonary sleep apnea ,    Pulmonary exam normal breath sounds clear to auscultation       Cardiovascular hypertension, Pt. on home beta blockers and Pt. on medications Normal cardiovascular exam Rhythm:Regular Rate:Normal  ECG: rate 74   Neuro/Psych Seizures -,  PSYCHIATRIC DISORDERS Depression Dementia  Neuromuscular disease CVA    GI/Hepatic negative GI ROS, Neg liver ROS,   Endo/Other  diabetes, Oral Hypoglycemic Agents  Renal/GU negative Renal ROS     Musculoskeletal  (+) Arthritis ,   Abdominal (+) + obese,   Peds  Hematology HLD   Anesthesia Other Findings LEFT INTERTOCHANTRIC FEMUR FRACTURE  Reproductive/Obstetrics                           Anesthesia Physical Anesthesia Plan  ASA: 3  Anesthesia Plan: General   Post-op Pain Management:    Induction: Intravenous  PONV Risk Score and Plan: 3 and Ondansetron, Dexamethasone and Treatment may vary due to age or medical condition  Airway Management Planned: Oral ETT  Additional Equipment:   Intra-op Plan:   Post-operative Plan: Extubation in OR  Informed Consent: I have reviewed the patients History and Physical, chart, labs and discussed the procedure including the risks, benefits and alternatives for the proposed anesthesia with the patient or authorized representative who has indicated his/her understanding and acceptance.     Consent reviewed with POA and Dental advisory given  Plan Discussed with: CRNA  Anesthesia Plan Comments: (Anesthetic plan discussed with son via telephone)      Anesthesia Quick Evaluation

## 2021-06-16 NOTE — Anesthesia Postprocedure Evaluation (Signed)
Anesthesia Post Note  Patient: Yvette Gutierrez  Procedure(s) Performed: INTRAMEDULLARY (IM) NAIL FEMORAL (Left: Hip)     Patient location during evaluation: PACU Anesthesia Type: General Level of consciousness: awake Pain management: pain level controlled Vital Signs Assessment: post-procedure vital signs reviewed and stable Respiratory status: spontaneous breathing, nonlabored ventilation, respiratory function stable and patient connected to nasal cannula oxygen Cardiovascular status: blood pressure returned to baseline and stable Postop Assessment: no apparent nausea or vomiting Anesthetic complications: no   No notable events documented.  Last Vitals:  Vitals:   06/16/21 1303 06/16/21 1357  BP: (!) 98/59 117/60  Pulse: 76 75  Resp: 12 18  Temp:  37 C  SpO2: 98% 100%    Last Pain:  Vitals:   06/16/21 1357  TempSrc: Oral  PainSc:                  Cailie Bosshart P Shandricka Monroy
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
## 2021-06-16 NOTE — Transfer of Care (Signed)
Immediate Anesthesia Transfer of Care Note  Patient: Yvette Gutierrez  Procedure(s) Performed: INTRAMEDULLARY (IM) NAIL FEMORAL (Left: Hip)  Patient Location: PACU  Anesthesia Type:General  Level of Consciousness: Awake  Airway & Oxygen Therapy: Patient Spontanous Breathing and Patient connected to face mask  Post-op Assessment: Report given to RN and Post -op Vital signs reviewed and stable  Post vital signs: Reviewed and stable  Last Vitals:  Vitals Value Taken Time  BP 155/76 06/16/21 1200  Temp    Pulse 72 06/16/21 1204  Resp 13 06/16/21 1204  SpO2 100 % 06/16/21 1204  Vitals shown include unvalidated device data.  Last Pain:  Vitals:   06/16/21 0708  TempSrc:   PainSc: Asleep      Patients Stated Pain Goal: 2 (06/16/21 5093)  Complications: No notable events documented.

## 2021-06-16 NOTE — Op Note (Signed)
DATE OF SURGERY:  06/16/2021  TIME: 11:27 AM  PATIENT NAME:  Yvette Gutierrez  AGE: 75 y.o.  PRE-OPERATIVE DIAGNOSIS: Left subtrochanteric femur fracture with nondisplaced left distal supracondylar femur recurrent fracture  POST-OPERATIVE DIAGNOSIS:  SAME  PROCEDURE: Treatment of left subtrochanteric femur fracture with intramedullary nail  SURGEON:  Eulas Post  ASSISTANT:  Janine Ores, PA-C, present and scrubbed throughout the case, critical for assistance with exposure, retraction, instrumentation, and closure.  OPERATIVE IMPLANTS: Synthes trochanteric femoral nail-A size 280 mm with a size 30 mm interlocking distal bolt and a size 80 mm helical blade  UNIQUE ASPECTS OF THE CASE: There was significant displacement of the subtrochanteric femur fracture, as well as malunion of the distal femur.  Preoperatively, she actually did not have that much pain in her distal femur, I am not sure if her recurrent fracture there is actually true, it is difficult given her cognitive function to determine, we will have to see how she progresses after surgery.  I did have to use a short nail, compared to the standard length nail, both because of her short stature as well as her deformity in the distal femur.  ESTIMATED BLOOD LOSS: 150 mL  PREOPERATIVE INDICATIONS:  Yvette Gutierrez is a 75 y.o. year old who fell and suffered a hip fracture. She was brought into the ER and then admitted and optimized and then elected for surgical intervention.    The risks benefits and alternatives were discussed with the patient and her family including but not limited to the risks of nonoperative treatment, versus surgical intervention including infection, bleeding, nerve injury, malunion, nonunion, hardware prominence, hardware failure, need for hardware removal, blood clots, cardiopulmonary complications, morbidity, mortality, among others, and they were willing to proceed.    OPERATIVE PROCEDURE:  The patient was  brought to the operating room and placed in the supine position. Anesthesia was administered. She was placed on the fracture table.  Closed reduction was performed under C-arm guidance.  Time out was then performed after sterile prep and drape. She received preoperative antibiotics.  We also did a preoperative auxiliary scrub.  Incision was made proximal to the greater trochanter. A guidewire was placed in the appropriate position. Confirmation was made on AP and lateral views.  The above-named nail was opened.  I measured the length of the nail both before prepping, as well as after placement of a guidewire.  I did open the subtrochanteric region slightly, and attempted reduction with a clamp as well as with a Cobb elevator, and ultimately had to externally rotate the femur a fair amount in order to regain satisfactory alignment.  I opened the proximal femur with a reamer. I then placed the nail by hand down. I did not need to ream the femur.  Once the nail was completely seated, I placed a guidepin into the femoral head into the center center position.  After placement of the guidewire, I was not totally satisfied with my reduction, because I think I still had some uncorrected rotational deformity, and I removed the guidewire, externally rotated the femur by 20 more degrees, which then improved her radiographic appearance.  I then replaced the guidewire, I measured the length, and then reamed the lateral cortex and up into the head. I then placed the cephalomedullary screw.  I also secured it completely.  Appropriate fixation achieved. Bone quality was mediocre.  I then turned my attention distally, and secured the distal cortex with a cortical screw using perfect circles  technique.  I took final C-arm pictures AP and lateral.   Appropriate reconstruction was achieved, and the wounds were irrigated copiously and closed with Vicryl followed by Steri-Strips and sterile gauze for the skin. The patient was  awakened and returned to PACU in stable and satisfactory condition. There were no complications and the patient tolerated the procedure well.  She will be weightbearing as tolerated, although I doubt she will be able to do much other than bed mobility, and will be on chemoprophylaxis  for a period of four weeks after discharge.   Teryl Lucy, M.D.

## 2021-06-16 NOTE — Anesthesia Procedure Notes (Signed)
Procedure Name: Intubation Date/Time: 06/16/2021 9:41 AM Performed by: Maxwell Caul, CRNA Pre-anesthesia Checklist: Patient identified, Emergency Drugs available, Suction available and Patient being monitored Patient Re-evaluated:Patient Re-evaluated prior to induction Oxygen Delivery Method: Circle system utilized Preoxygenation: Pre-oxygenation with 100% oxygen Induction Type: IV induction Ventilation: Mask ventilation without difficulty Laryngoscope Size: Mac and 4 Grade View: Grade II Tube type: Oral Tube size: 7.0 mm Number of attempts: 1 Airway Equipment and Method: Stylet Placement Confirmation: ETT inserted through vocal cords under direct vision, positive ETCO2 and breath sounds checked- equal and bilateral Secured at: 21 cm Tube secured with: Tape Dental Injury: Teeth and Oropharynx as per pre-operative assessment

## 2021-06-17 LAB — CBC
HCT: 24.3 % — ABNORMAL LOW (ref 36.0–46.0)
HCT: 25.3 % — ABNORMAL LOW (ref 36.0–46.0)
Hemoglobin: 7.7 g/dL — ABNORMAL LOW (ref 12.0–15.0)
Hemoglobin: 7.9 g/dL — ABNORMAL LOW (ref 12.0–15.0)
MCH: 28.8 pg (ref 26.0–34.0)
MCH: 29.2 pg (ref 26.0–34.0)
MCHC: 31.7 g/dL (ref 30.0–36.0)
MCHC: 31.2 g/dL (ref 30.0–36.0)
MCV: 91 fL (ref 80.0–100.0)
MCV: 93.4 fL (ref 80.0–100.0)
Platelets: 169 10*3/uL (ref 150–400)
Platelets: 194 10*3/uL (ref 150–400)
RBC: 2.67 MIL/uL — ABNORMAL LOW (ref 3.87–5.11)
RBC: 2.71 MIL/uL — ABNORMAL LOW (ref 3.87–5.11)
RDW: 13.6 % (ref 11.5–15.5)
RDW: 13.9 % (ref 11.5–15.5)
WBC: 9.3 10*3/uL (ref 4.0–10.5)
WBC: 8.2 10*3/uL (ref 4.0–10.5)
nRBC: 0 % (ref 0.0–0.2)
nRBC: 0 % (ref 0.0–0.2)

## 2021-06-17 LAB — GLUCOSE, CAPILLARY
Glucose-Capillary: 175 mg/dL — ABNORMAL HIGH (ref 70–99)
Glucose-Capillary: 244 mg/dL — ABNORMAL HIGH (ref 70–99)
Glucose-Capillary: 192 mg/dL — ABNORMAL HIGH (ref 70–99)
Glucose-Capillary: 211 mg/dL — ABNORMAL HIGH (ref 70–99)

## 2021-06-17 LAB — BASIC METABOLIC PANEL
Anion gap: 7 (ref 5–15)
BUN: 29 mg/dL — ABNORMAL HIGH (ref 8–23)
CO2: 23 mmol/L (ref 22–32)
Calcium: 8.4 mg/dL — ABNORMAL LOW (ref 8.9–10.3)
Chloride: 104 mmol/L (ref 98–111)
Creatinine, Ser: 0.92 mg/dL (ref 0.44–1.00)
GFR, Estimated: >60 mL/min (ref >60)
Glucose, Bld: 246 mg/dL — ABNORMAL HIGH (ref 70–99)
Potassium: 4.6 mmol/L (ref 3.5–5.1)
Sodium: 134 mmol/L — ABNORMAL LOW (ref 135–145)

## 2021-06-17 MED ORDER — LORAZEPAM 2 MG/ML IJ SOLN
0.5000 mg | Freq: Every day | INTRAMUSCULAR | Status: DC | PRN
  Administered 2021-06-18: 0.5 mg via INTRAVENOUS
  Filled 2021-06-17: qty 1

## 2021-06-17 MED ORDER — ACETAMINOPHEN 325 MG PO TABS: 650.0000 mg | ORAL_TABLET | Freq: Four times a day (QID) | ORAL | Status: DC

## 2021-06-17 MED ORDER — ACETAMINOPHEN 325 MG PO TABS
650.0000 mg | ORAL_TABLET | Freq: Four times a day (QID) | ORAL | Status: DC
  Administered 2021-06-17 – 2021-06-18 (×2): 650 mg via ORAL
  Filled 2021-06-17 (×2): qty 2

## 2021-06-17 NOTE — Progress Notes (Signed)
Subjective: 1 Day Post-Op s/p Procedure(s): INTRAMEDULLARY (IM) NAIL FEMORAL   Patient is alert. Able to open eyes and follow all commands. When asks where she feels pain she shrugs her right shoulder. However, when asked again after asked to actively move shoulder, she states it is not painful. No verbal responses to any questions.     Objective:  PE: VITALS:   Vitals:   06/16/21 1659 06/16/21 2119 06/17/21 0622 06/17/21 0939  BP: 124/67 (!) 132/57 (!) 151/80 (!) 141/55  Pulse: 91 86 84 82  Resp: 16 18 18 18   Temp: 98.3 F (36.8 C) 98.7 F (37.1 C) 98.5 F (36.9 C) 98.7 F (37.1 C)  TempSrc: Oral Oral Oral Oral  SpO2: 100% 96% 99%   Weight:      Height:       General: laying in bed, in no acute distress GI: soft, non-tender abdomen MSK: LLE -dorsiflexion plantarflexion intact.  Able to flex and extend all toes.  Patient does not respond when asked regarding sensation.  Long Mepilex looks to have been placed over original dressings, no significant drainage through this Mepilex at this time.  No tenderness to palpation over the left knee. No hematoma formation.  LABS  Results for orders placed or performed during the hospital encounter of 06/15/21 (from the past 24 hour(s))  Glucose, capillary     Status: Abnormal   Collection Time: 06/16/21  1:54 PM  Result Value Ref Range   Glucose-Capillary 190 (H) 70 - 99 mg/dL  Glucose, capillary     Status: Abnormal   Collection Time: 06/16/21  4:07 PM  Result Value Ref Range   Glucose-Capillary 235 (H) 70 - 99 mg/dL  Glucose, capillary     Status: Abnormal   Collection Time: 06/16/21  9:37 PM  Result Value Ref Range   Glucose-Capillary 242 (H) 70 - 99 mg/dL  CBC     Status: Abnormal   Collection Time: 06/17/21  3:57 AM  Result Value Ref Range   WBC 9.3 4.0 - 10.5 K/uL   RBC 2.67 (L) 3.87 - 5.11 MIL/uL   Hemoglobin 7.7 (L) 12.0 - 15.0 g/dL   HCT 08/17/21 (L) 76.1 - 95.0 %   MCV 91.0 80.0 - 100.0 fL   MCH 28.8 26.0 - 34.0  pg   MCHC 31.7 30.0 - 36.0 g/dL   RDW 93.2 67.1 - 24.5 %   Platelets 169 150 - 400 K/uL   nRBC 0.0 0.0 - 0.2 %  Basic metabolic panel     Status: Abnormal   Collection Time: 06/17/21  3:57 AM  Result Value Ref Range   Sodium 134 (L) 135 - 145 mmol/L   Potassium 4.6 3.5 - 5.1 mmol/L   Chloride 104 98 - 111 mmol/L   CO2 23 22 - 32 mmol/L   Glucose, Bld 246 (H) 70 - 99 mg/dL   BUN 29 (H) 8 - 23 mg/dL   Creatinine, Ser 08/17/21 0.44 - 1.00 mg/dL   Calcium 8.4 (L) 8.9 - 10.3 mg/dL   GFR, Estimated 9.83 >38 mL/min   Anion gap 7 5 - 15  Glucose, capillary     Status: Abnormal   Collection Time: 06/17/21  7:33 AM  Result Value Ref Range   Glucose-Capillary 175 (H) 70 - 99 mg/dL  Glucose, capillary     Status: Abnormal   Collection Time: 06/17/21 11:17 AM  Result Value Ref Range   Glucose-Capillary 244 (H) 70 - 99 mg/dL  DG Chest 1 View  Result Date: 06/15/2021 CLINICAL DATA:  Combative, kicked bedside table. EXAM: CHEST  1 VIEW COMPARISON:  Chest radiograph dated 10/07/2014. FINDINGS: The heart is enlarged. There is minimal scarring in the left lower lung laterally, unchanged. The right lung is clear. There is no pleural effusion or pneumothorax. Degenerative changes are seen in the spine. Chronic deformities are seen of both shoulders. IMPRESSION: No acute pulmonary process. Electronically Signed   By: Romona Curls M.D.   On: 06/15/2021 19:45   DG Pelvis 1-2 Views  Result Date: 06/15/2021 CLINICAL DATA:  Broken hip after attempting to cake a bedside table. EXAM: PELVIS - 1-2 VIEW COMPARISON:  None. FINDINGS: There is an acute fracture of the subtrochanteric left femur. There is no hip dislocation. Stool overlies the rectum. IMPRESSION: Acute subtrochanteric fracture of the left femur. Electronically Signed   By: Romona Curls M.D.   On: 06/15/2021 19:41   CT KNEE LEFT WO CONTRAST  Result Date: 06/15/2021 CLINICAL DATA:  Is left hip fracture.  Abnormal x-ray. EXAM: CT OF THE left KNEE  WITHOUT CONTRAST TECHNIQUE: Multidetector CT imaging of the left knee was performed according to the standard protocol. Multiplanar CT image reconstructions were also generated. COMPARISON:  Left femur radiographs 06/15/2021 FINDINGS: Bones/Joint/Cartilage Remote healed comminuted distal femur fracture again noted. An acute fracture through the healed component is nondisplaced, best seen on the coronal images. This is through the healed bone mass connecting the non fractured femoral shaft to the femoral condyles. Ligaments Suboptimally assessed by CT. Muscles and Tendons Unremarkable. Soft tissues No other soft tissue injury. IMPRESSION: 1. Remote healed comminuted distal femur fracture. 2. Acute nondisplaced fracture through the healed component of the distal femur fracture. Electronically Signed   By: Marin Roberts M.D.   On: 06/15/2021 21:29   DG C-Arm 1-60 Min-No Report  Result Date: 06/16/2021 Fluoroscopy was utilized by the requesting physician.  No radiographic interpretation.   DG C-Arm 1-60 Min-No Report  Result Date: 06/16/2021 Fluoroscopy was utilized by the requesting physician.  No radiographic interpretation.   DG FEMUR MIN 2 VIEWS LEFT  Addendum Date: 06/16/2021   ADDENDUM REPORT: 06/16/2021 19:01 COMPARISON:  10/06/2014 FINDINGS: The distal femoral fracture is old, noted on prior study with healing and deformity. IMPRESSION: Comparing current study to old study, the distal femoral fracture is old healed fracture. Electronically Signed   By: Charlett Nose M.D.   On: 06/16/2021 19:01   Result Date: 06/16/2021 CLINICAL DATA:  Broken hip after attempting to cake a bedside table. EXAM: LEFT FEMUR 2 VIEWS COMPARISON:  None. FINDINGS: There is an acute fracture of the subtrochanteric left femur. There is a comminuted fracture of the distal femur which appears to involve the intercondylar region and extend to the knee joint, however this is incompletely characterized given limited views of  the fracture. IMPRESSION: 1.  Acute subtrochanteric fracture of the left femur. 2. Acute distal left femoral fracture appears to involve the intercondylar region and extend to the knee joint. Dedicated knee radiographs could be helpful for further characterization. Electronically Signed: By: Romona Curls M.D. On: 06/15/2021 19:43   DG FEMUR MIN 2 VIEWS LEFT  Result Date: 06/16/2021 CLINICAL DATA:  Hip fracture EXAM: LEFT FEMUR 2 VIEWS COMPARISON:  CT 06/15/2021 FINDINGS: A series of fluoroscopic spot images taken intraoperatively document sliding screw and IM rod fixation across the femoral neck, fragments in near anatomic alignment. Fracture deformity of the distal femur is noted. IMPRESSION: Orthopedic fixation of left  femoral neck fracture. Electronically Signed   By: Corlis Leak M.D.   On: 06/16/2021 12:02    Assessment/Plan: Left subtrochanteric femur fracture 1 Day Post-Op s/p Procedure(s): INTRAMEDULLARY (IM) NAIL FEMORAL  Weightbearing: WBAT, LLE Insicional and dressing care: Reinforce dressings as needed we will plan to change Mepilex tomorrow. VTE prophylaxis:  Large Hbg drop 11.8 pre-op to 7.7 this morning, lovenox was started this morning. If Hbg continues to drop tomorrow, will plan to hold lovenox. Will continue to watch.  Pain control:  650 tylenol q 6 hours scheduled Hydrocodone 5/325 1-2 tablets prn severe pain Morphine IV severe pain not controlled by po anaglesics Follow - up plan: 2 weeks with Dr. Dion Saucier Dispo: not medically ready for discharge given large Hbg drop, PT recommending SNF, TOC on board  Contact information:   Weekdays 8-5 Janine Ores, PA-C [phone] A fter hours and holidays please check [email or web address] for group call information for [facility]  Armida Sans 06/17/2021, 12:53 PM

## 2021-06-17 NOTE — TOC Initial Note (Addendum)
Transition of Care [facility]) - Initial/Assessment Note   Patient Details  Name: Yvette Gutierrez MRN: [id number] Date of Birth: 1946-10-11  [facility] CM/SW Contact:    Ewing Schlein, LCSW Phone Number: 06/17/2021, 12:15 PM  Clinical Narrative: Patient is a 75 year old female who was admitted for a femur fracture. PT evaluation recommended SNF.  CSW spoke with patient's son, Porcsha Richards, to complete assessment as patient has dementia and is oriented x2. Per son, patient is a LTC resident of [facility] and has lived there for approximately 5 years. Son is agreeable to patient returning to the facility for short-term rehab before transitioning her back to LTC.  FL2 done; PASRR pending as [employer]CMUST website is not currently functioning. Initial referral and PT notes faxed to [facility] in hub.  Addendum: CSW able to access [facility]. Patient's PASRR is: [id number].  Expected Discharge Plan: Skilled Nursing Facility Barriers to Discharge: Continued Medical Work up  Patient Goals and CMS Choice Patient states their goals for this hospitalization and ongoing recovery are:: Return to [facility] for short-term rehab CMS Medicare.gov Compare Post Acute Care list provided to:: Patient Represenative (must comment) Khushbu Bowlds (son)) Choice offered to / list presented to : Adult Children  Expected Discharge Plan and Services Expected Discharge Plan: Skilled Nursing Facility In-house Referral: Clinical Social Work Post Acute Care Choice: Skilled Nursing Facility Living arrangements for the past 2 months: Skilled Nursing Facility           DME Arranged: N/A DME Agency: NA  Prior Living Arrangements/Services Living arrangements for the past 2 months: Skilled Nursing Facility Lives with:: Facility Resident Patient language and need for interpreter reviewed:: Yes Do you feel safe going back to the place where you live?: Yes      Need for Family Participation in Patient Care: Yes (Comment)  (Patient has dementia.) Care giver support system in place?: Yes (comment) Criminal Activity/Legal Involvement Pertinent to Current Situation/Hospitalization: No - Comment as needed  Activities of Daily Living Home Assistive Devices/Equipment: Eyeglasses (pt unable to tell every thing) ADL Screening (condition at time of admission) Patient's cognitive ability adequate to safely complete daily activities?: No Is the patient deaf or have difficulty hearing?: No Does the patient have difficulty seeing, even when wearing glasses/contacts?: No Does the patient have difficulty concentrating, remembering, or making decisions?: Yes (dementia) Patient able to express need for assistance with ADLs?: Yes Does the patient have difficulty dressing or bathing?: Yes Independently performs ADLs?: No Communication: Dependent Is this a change from baseline?: Pre-admission baseline Dressing (OT): Needs assistance Is this a change from baseline?: Pre-admission baseline Grooming: Needs assistance Is this a change from baseline?: Pre-admission baseline Feeding: Dependent Is this a change from baseline?: Pre-admission baseline Bathing: Needs assistance Is this a change from baseline?: Pre-admission baseline Toileting: Needs assistance Is this a change from baseline?: Pre-admission baseline In/Out Bed: Dependent Is this a change from baseline?: Pre-admission baseline Walks in Home: Needs assistance Is this a change from baseline?: Pre-admission baseline Does the patient have difficulty walking or climbing stairs?: Yes Weakness of Legs: Left Weakness of Arms/Hands: None  Permission Sought/Granted Permission sought to share information with : Oceanographer granted to share information with : Yes, Verbal Permission Granted Permission granted to share info w AGENCY: [facility]  Emotional Assessment Orientation: : Oriented to Self, Oriented to Place Alcohol / Substance Use:  Not Applicable Psych Involvement: No (comment)  Admission diagnosis:  Femur fracture (HCC) [S72.90XA] Hip  pain [M25.559] Pain [R52] Patient Active Problem List   Diagnosis Date Noted   Femur fracture (HCC) 06/15/2021   History of cardioembolic cerebrovascular accident (CVA) 12/31/2016   Vascular dementia without behavioral disturbance (HCC) 12/31/2016   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   CKD (chronic kidney disease) stage 3, GFR 30-59 ml/min (HCC) 08/26/2016   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 disease, with long-term current use of insulin (HCC) 10/29/2015   Essential hypertension, benign 10/29/2015   DM type 2 with diabetic peripheral neuropathy (HCC) 02/14/2015   Major depressive disorder, recurrent episode, mild (HCC) 02/14/2015   Umbilical hernia without obstruction and without gangrene 02/14/2015   Primary osteoarthritis of both shoulders 02/14/2015   Slow transit constipation 02/14/2015   Seizure disorder (HCC) 02/14/2015   History of fall 2014-07-03   Death of family member February 05, 2014   Rash and nonspecific skin eruption 05/04/2013   OSA (obstructive sleep apnea) 04/06/2013   Abnormality of gait 04/05/2013   Diabetic retinopathy (HCC) 06/22/2012   Hyperlipidemia LDL goal <70 04/02/2012   Cervical cancer screening 05/28/2011   Colon cancer screening 05/28/2011   Allergic rhinitis    Neuropathy due to secondary diabetes (HCC)    CVA (cerebral infarction)    Bilateral shoulder pain    BACK PAIN 07/09/2010   VITAMIN D DEFICIENCY 10/31/2009   DEPENDENT EDEMA, LEGS, BILATERAL 10/31/2009   OBESITY, MORBID 05/14/2007   ONYCHOMYCOSIS 04/02/2007   DIABETES MELLITUS, TYPE II 03/31/2007   Major depression, chronic 03/31/2007   Essential hypertension 03/31/2007   PCP:  Patient, No Pcp Per (Inactive) Pharmacy:   [facility] - [place], [place] - [id number], [address] [place]  [address] Phone: [phone] Fax: [phone]  Readmission Risk Interventions No flowsheet data found.

## 2021-06-17 NOTE — Plan of Care (Signed)

## 2021-06-17 NOTE — Evaluation (Signed)
Physical Therapy Evaluation Patient Details Name: Yvette Gutierrez MRN: [id number] DOB: [birth year] Today's Date: 06/17/2021   History of Present Illness  75 yo female admitted with L femur fracture s/p IM nail 9/4. Hx of dementia, DM, CVA, neuropathy, L femur fx, obesity, Sz d/o, bil shoulder pain  Clinical Impression  On eval, pt required Total assist +2 for bed mobility. Total assist for static sitting at EOB. Pt resistive with heavy posterior lean. Able to assist pt with sitting EOB for ~2 minutes with max encouragement. Assisted pt back to bed.     Follow Up Recommendations SNF    Equipment Recommendations  None recommended by PT    Recommendations for Other Services       Precautions / Restrictions Precautions Precautions: Fall Restrictions Weight Bearing Restrictions: No LLE Weight Bearing: Weight bearing as tolerated      Mobility  Bed Mobility Overal bed mobility: Needs Assistance Bed Mobility: Supine to Sit;Sit to Supine     Supine to sit: Total assist;+2 for physical assistance;+2 for safety/equipment;HOB elevated Sit to supine: Total assist;+2 for physical assistance;+2 for safety/equipment;HOB elevated   General bed mobility comments: Assist for trunk and bil LEs. Utilized bedpad to position pt at EOB. Heavy posterior leaning with pt resistive and refusing to participate further despite encouragement. Total assist for static sitting balance    Transfers                    Ambulation/Gait                Stairs            Wheelchair Mobility    Modified Rankin (Stroke Patients Only)       Balance Overall balance assessment: Needs assistance;History of Falls   Sitting balance-Leahy Scale: Zero Sitting balance - Comments: Total assist for static sitting balance. Heavy posterior lean                                     Pertinent Vitals/Pain Pain Assessment: Faces Faces Pain Scale: Hurts even more Pain Location: L LE  with movement Pain Descriptors / Indicators: Moaning;Guarding;Discomfort Pain Intervention(s): Limited activity within patient's tolerance;Monitored during session;Repositioned    Home Living Family/patient expects to be discharged to:: Skilled nursing facility                      Prior Function Level of Independence: Needs assistance   Gait / Transfers Assistance Needed: unsure of PLOF-pt is not a reliable historian  ADL's / Homemaking Assistance Needed: unsure of PLOF-pt is not a reliable historian        Hand Dominance        Extremity/Trunk Assessment   Upper Extremity Assessment Upper Extremity Assessment: Generalized weakness (limited ROM bil shoulders likely. Pt also a bit resistive and self limiting)    Lower Extremity Assessment Lower Extremity Assessment: Difficult to assess due to impaired cognition (difficult to assess 2* impaired cognition and pain)    Cervical / Trunk Assessment Cervical / Trunk Assessment: Kyphotic  Communication   Communication: No difficulties  Cognition Arousal/Alertness: Awake/alert Behavior During Therapy: WFL for tasks assessed/performed Overall Cognitive Status: History of cognitive impairments - at baseline  General Comments      Exercises     Assessment/Plan    PT Assessment Patient needs continued PT services  PT Problem List Decreased strength;Decreased mobility;Decreased range of motion;Decreased activity tolerance;Decreased balance;Decreased knowledge of use of DME;Pain;Decreased cognition;Decreased safety awareness;Obesity       PT Treatment Interventions DME instruction;Gait training;Therapeutic exercise;Balance training;Functional mobility training;Therapeutic activities;Patient/family education    PT Goals (Current goals can be found in the Care Plan section)  Acute Rehab PT Goals Patient Stated Goal: none stated PT Goal Formulation: Patient unable  to participate in goal setting Time For Goal Achievement: 07/01/21 Potential to Achieve Goals: Fair    Frequency Min 2X/week   Barriers to discharge        Co-evaluation               AM-PAC PT "6 Clicks" Mobility  Outcome Measure Help needed turning from your back to your side while in a flat bed without using bedrails?: Total Help needed moving from lying on your back to sitting on the side of a flat bed without using bedrails?: Total Help needed moving to and from a bed to a chair (including a wheelchair)?: Total Help needed standing up from a chair using your arms (e.g., wheelchair or bedside chair)?: Total Help needed to walk in hospital room?: Total Help needed climbing 3-5 steps with a railing? : Total 6 Click Score: 6    End of Session   Activity Tolerance: Patient limited by pain (limited by cognition) Patient left: in bed;with call bell/phone within reach;with bed alarm set   PT Visit Diagnosis: Muscle weakness (generalized) (M62.81);Pain Pain - Right/Left: Left Pain - part of body: Hip    Time: 1005-1016 PT Time Calculation (min) (ACUTE ONLY): 11 min   Charges:   PT Evaluation $PT Eval Moderate Complexity: 1 Mod            Faye Ramsay, PT Acute Rehabilitation  Office: [phone] Pager: [phone]

## 2021-06-17 NOTE — [place] FL2 (Addendum)
[place] MEDICAID FL2 LEVEL OF CARE SCREENING TOOL     IDENTIFICATION  Patient Name: Yvette Gutierrez Birthdate: [birth year] Sex: female Admission Date (Current Location): 06/15/2021  [facility] and [place] Number:  Producer, television/film/video and Address:  [facility],  [address] [place], [place] [address]      Provider Number: [id number]  Attending Physician Name and Address:  Lorin Glass, MD  Relative Name and Phone Number:  Yuxi Panetti (son) Ph: [phone]    Current Level of Care: Hospital Recommended Level of Care: Skilled Nursing Facility Prior Approval Number:    Date Approved/Denied:   PASRR Number: [id number]  Discharge Plan: SNF    Current Diagnoses: Patient Active Problem List   Diagnosis Date Noted   Femur fracture (HCC) 06/15/2021   History of cardioembolic cerebrovascular accident (CVA) 12/31/2016   Vascular dementia without behavioral disturbance (HCC) 12/31/2016   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   CKD (chronic kidney disease) stage 3, GFR 30-59 ml/min (HCC) 08/26/2016   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 disease, with long-term current use of insulin (HCC) 10/29/2015   Essential hypertension, benign 10/29/2015   DM type 2 with diabetic peripheral neuropathy (HCC) 02/14/2015   Major depressive disorder, recurrent episode, mild (HCC) 02/14/2015   Umbilical hernia without obstruction and without gangrene 02/14/2015   Primary osteoarthritis of both shoulders 02/14/2015   Slow transit constipation 02/14/2015   Seizure disorder (HCC) 02/14/2015   History of fall 07-30-2014   Death of family member 03-04-2014   Rash and nonspecific skin eruption 05/04/2013   OSA (obstructive sleep apnea) 04/06/2013   Abnormality of gait 04/05/2013   Diabetic retinopathy (HCC) 06/22/2012   Hyperlipidemia LDL goal <70 04/02/2012   Cervical cancer screening 05/28/2011   Colon cancer screening 05/28/2011   Allergic rhinitis     Neuropathy due to secondary diabetes (HCC)    CVA (cerebral infarction)    Bilateral shoulder pain    BACK PAIN 07/09/2010   VITAMIN D DEFICIENCY 10/31/2009   DEPENDENT EDEMA, LEGS, BILATERAL 10/31/2009   OBESITY, MORBID 05/14/2007   ONYCHOMYCOSIS 04/02/2007   DIABETES MELLITUS, TYPE II 03/31/2007   Major depression, chronic 03/31/2007   Essential hypertension 03/31/2007    Orientation RESPIRATION BLADDER Height & Weight     Self, Place  Normal Incontinent Weight: 184 lb 1.4 oz (83.5 kg) Height:  5' (152.4 cm)  BEHAVIORAL SYMPTOMS/MOOD NEUROLOGICAL BOWEL NUTRITION STATUS  Other (Comment) (Patient can be physically aggressive when receiving care.) Convulsions/Seizures Continent Diet (Regular diet)  AMBULATORY STATUS COMMUNICATION OF NEEDS Skin   Total Care Verbally Surgical wounds                       Personal Care Assistance Level of Assistance  Bathing, Feeding, Dressing Bathing Assistance: Maximum assistance Feeding assistance: Independent Dressing Assistance: Maximum assistance     Functional Limitations Info  Sight, Hearing, Speech Sight Info: Impaired Hearing Info: Adequate Speech Info: Adequate    SPECIAL CARE FACTORS FREQUENCY  PT (By licensed PT), OT (By licensed OT)     PT Frequency: 5x's/week OT Frequency: 5x's/week            Contractures Contractures Info: Not present    Additional Factors Info  Code Status, Allergies, Psychotropic, Insulin Sliding Scale Code Status Info: Full Allergies Info: NKA Psychotropic Info: Ativan Insulin Sliding Scale Info: See discharge summary       Current Medications (06/18/2021):  This is the current hospital  active medication list Current Facility-Administered Medications  Medication Dose Route Frequency Provider Last Rate Last Admin   0.45 % NaCl with KCl 20 mEq / L infusion   Intravenous Continuous Armida Sans, PA-C 50 mL/hr at 06/17/21 2227 New Bag at 06/17/21 2227   acetaminophen (TYLENOL) tablet  650 mg  650 mg Oral Q6H Leeroy Bock, MD   650 mg at 06/17/21 1808   alum & mag hydroxide-simeth (MAALOX/MYLANTA) 200-200-20 MG/5ML suspension 30 mL  30 mL Oral Q4H PRN Janine Ores K, PA-C       bisacodyl (DULCOLAX) suppository 10 mg  10 mg Rectal Daily PRN Manson Passey, Blaine K, PA-C       carvedilol (COREG) tablet 12.5 mg  12.5 mg Oral BID WC Janine Ores K, PA-C   12.5 mg at 06/18/21 0838   enoxaparin (LOVENOX) injection 40 mg  40 mg Subcutaneous Q24H Janine Ores K, PA-C   40 mg at 06/18/21 6578   ferrous sulfate tablet 325 mg  325 mg Oral TID PC Janine Ores K, PA-C   325 mg at 06/18/21 4696   HYDROcodone-acetaminophen (NORCO/VICODIN) 5-325 MG per tablet 1-2 tablet  1-2 tablet Oral Q6H PRN Audrea Muscat T, NP   1 tablet at 06/18/21 0430   insulin aspart (novoLOG) injection 0-15 Units  0-15 Units Subcutaneous TID WC Janine Ores K, PA-C   3 Units at 06/17/21 1811   insulin detemir (LEVEMIR) injection 30 Units  30 Units Subcutaneous QHS Armida Sans, PA-C   30 Units at 06/17/21 2231   lacosamide (VIMPAT) tablet 50 mg  50 mg Oral BID Janine Ores K, PA-C   50 mg at 06/18/21 0839   lisinopril (ZESTRIL) tablet 2.5 mg  2.5 mg Oral Daily Armida Sans, PA-C   2.5 mg at 06/18/21 2952   LORazepam (ATIVAN) injection 0.5 mg  0.5 mg Intravenous Daily PRN Leeroy Bock, MD   0.5 mg at 06/18/21 8413   menthol-cetylpyridinium (CEPACOL) lozenge 3 mg  1 lozenge Oral PRN Janine Ores K, PA-C       Or   phenol (CHLORASEPTIC) mouth spray 1 spray  1 spray Mouth/Throat PRN Janine Ores K, PA-C       morphine 4 MG/ML injection 0.52-1 mg  0.52-1 mg Intravenous Q2H PRN Manson Passey, Blaine K, PA-C       NIFEdipine (PROCARDIA-XL/NIFEDICAL-XL) 24 hr tablet 30 mg  30 mg Oral Daily Janine Ores K, PA-C   30 mg at 06/18/21 0843   ondansetron (ZOFRAN) tablet 4 mg  4 mg Oral Q6H PRN Janine Ores K, PA-C       Or   ondansetron [facility]) injection 4 mg  4 mg Intravenous Q6H PRN Manson Passey, Blaine K, PA-C        polyethylene glycol (MIRALAX / GLYCOLAX) packet 17 g  17 g Oral BID Leeroy Bock, MD   17 g at 06/18/21 2440   senna (SENOKOT) tablet 8.6 mg  1 tablet Oral BID Janine Ores K, PA-C   8.6 mg at 06/18/21 1027   simvastatin (ZOCOR) tablet 10 mg  10 mg Oral QHS Janine Ores K, PA-C   10 mg at 06/17/21 2230   sodium phosphate (FLEET) 7-19 GM/118ML enema 1 enema  1 enema Rectal Once PRN Armida Sans, PA-C         Discharge Medications: Please see discharge summary for a list of discharge medications.  Relevant Imaging Results:  Relevant Lab Results:   Additional Information SSN: [id number]  Amada Jupiter,  LCSW

## 2021-06-17 NOTE — Progress Notes (Signed)
PROGRESS NOTE  Yvette Gutierrez    DOB: Sep 10, 1946, 75 y.o.  ZOX:[id number]  PCP: Patient, No Pcp Per (Inactive)   Code Status: Full Code   DOA: 06/15/2021   LOS: 2  Brief Narrative of Current Hospitalization  Yvette Gutierrez is a 75 y.o. female with a PMH significant for dementia, CVA, diabetes mellitus type 2, previous distal left femur fracture which was treated non-operatively, HTN, HLD, seizure disorder, depression. They presented from SNF to the ED on 06/15/2021 with acute Left femur fx. In the ED, it was found that they had left subtrochanteric femur fx and healed fx of distal L femur on CT. Ortho was consulted for surgical fixation Patient was admitted to medicine service for further workup and management as outlined in detail below.  06/17/21 - POD#1  Assessment & Plan  Active Problems:   Essential hypertension   Hyperlipidemia LDL goal <70   DM type 2 with diabetic peripheral neuropathy (HCC)   Seizure disorder (HCC)   Femur fracture (HCC)  POD#1 s/p L subtrochanteric femur fx fixation- Hgb from 11.8>7.7 today. Significant drop from surgery and partially dilutional. Transfusion threshold of 7.  - f/u ortho - pain control- scheduled pain control as patient is unable to reliable request medications and pain could contribute to delirium  - PT/OT - TOC consult for placement - repeat CBC 1700  H/o CVA, HLD-  - continue simvastatin  DM- glucose elevated in mid 200s overnight - holding metformin - sSSI with meals - lantus 30 daily  Seizure disorder, depression - continue Vimpat  HTN- labile with intermittent lows of 90s/50s. - continue home carvedilol - hold lasix - continue lisinopril  Dementia-  - delirium precautions  DVT prophylaxis: enoxaparin (LOVENOX) injection 40 mg Start: 06/17/21 0800 SCDs Start: 06/16/21 1303 SCDs Start: 06/15/21 2222   Diet:  Diet Orders (From admission, onward)     Start     Ordered   06/16/21 1303  Diet regular Room service  appropriate? Yes; Fluid consistency: Thin  Diet effective now       Question Answer Comment  Room service appropriate? Yes   Fluid consistency: Thin      06/16/21 1303            Subjective 06/17/21    Pt is asleep on exam. Responds to her name and falls back asleep. Does not show signs of pain when slight manipulation of soft tissue on her surgical hip is performed.   Disposition Plan & Communication  Status is: Inpatient  Remains inpatient appropriate because:Inpatient level of care appropriate due to severity of illness  Dispo: The patient is from: SNF              Anticipated d/c is to: SNF              Patient currently is not medically stable to d/c.   Difficult to place patient No  Consults, Procedures, Significant Events  Consultants:  ortho  Procedures/significant events:  L Femur fixation 9/4  Antimicrobials:  Anti-infectives (From admission, onward)    Start     Dose/Rate Route Frequency Ordered Stop   06/16/21 1600  ceFAZolin (ANCEF) IVPB 2g/100 mL premix        2 g 200 mL/hr over 30 Minutes Intravenous Every 6 hours 06/16/21 1303 06/16/21 2210   06/16/21 1000  ceFAZolin (ANCEF) IVPB 2g/100 mL premix        2 g 200 mL/hr over 30 Minutes Intravenous  Once 06/16/21 0959 06/16/21  1013   06/16/21 0910  ceFAZolin (ANCEF) 2-4 GM/100ML-% IVPB       Note to Pharmacy: Mortimer Fries   : cabinet override      06/16/21 0910 06/16/21 0959        Objective   Vitals:   06/16/21 1602 06/16/21 1659 06/16/21 2119 06/17/21 0622  BP: (!) 139/58 124/67 (!) 132/57 (!) 151/80  Pulse: 89 91 86 84  Resp: 15 16 18 18   Temp: 98.2 F (36.8 C) 98.3 F (36.8 C) 98.7 F (37.1 C) 98.5 F (36.9 C)  TempSrc: Oral Oral Oral Oral  SpO2: 100% 100% 96% 99%  Weight:      Height:        Intake/Output Summary (Last 24 hours) at 06/17/2021 0723 Last data filed at 06/17/2021 0640 Gross per 24 hour  Intake 3578.7 ml  Output 1300 ml  Net 2278.7 ml    Filed Weights    06/15/21 1913  Weight: 83.5 kg    Patient BMI: Body mass index is 35.95 kg/m.   Physical Exam: General: asleep and appear comfortable Respiratory: CTAB, no wheezes, rales or rhonchi, normal respiratory effort. Cardiovascular: quick capillary refill LE bilaterally Gastrointestinal: soft, NT, ND, no HSM felt Nervous: alerts to her name being said and falls back asleep Extremities: trace edema bilateral LE. Surgical incision on left leg appears clean. No apparent bleeding or large hematoma on exam Skin: dry, intact, normal temperature, normal color  Labs   I have personally reviewed following labs and imaging studies Admission on 06/15/2021  Component Date Value Ref Range Status   SARS Coronavirus 2 by RT PCR 06/15/2021 NEGATIVE  NEGATIVE Final   Influenza A by PCR 06/15/2021 NEGATIVE  NEGATIVE Final   Influenza B by PCR 06/15/2021 NEGATIVE  NEGATIVE Final   WBC 06/15/2021 12.2 (A) 4.0 - 10.5 K/uL Final   RBC 06/15/2021 4.29  3.87 - 5.11 MIL/uL Final   Hemoglobin 06/15/2021 12.5  12.0 - 15.0 g/dL Final   HCT 16/07/9603 39.2  36.0 - 46.0 % Final   MCV 06/15/2021 91.4  80.0 - 100.0 fL Final   MCH 06/15/2021 29.1  26.0 - 34.0 pg Final   MCHC 06/15/2021 31.9  30.0 - 36.0 g/dL Final   RDW 54/06/8118 13.2  11.5 - 15.5 % Final   Platelets 06/15/2021 202  150 - 400 K/uL Final   nRBC 06/15/2021 0.0  0.0 - 0.2 % Final   Neutrophils Relative % 06/15/2021 80  % Final   Neutro Abs 06/15/2021 9.8 (A) 1.7 - 7.7 K/uL Final   Lymphocytes Relative 06/15/2021 14  % Final   Lymphs Abs 06/15/2021 1.7  0.7 - 4.0 K/uL Final   Monocytes Relative 06/15/2021 5  % Final   Monocytes Absolute 06/15/2021 0.6  0.1 - 1.0 K/uL Final   Eosinophils Relative 06/15/2021 0  % Final   Eosinophils Absolute 06/15/2021 0.0  0.0 - 0.5 K/uL Final   Basophils Relative 06/15/2021 0  % Final   Basophils Absolute 06/15/2021 0.0  0.0 - 0.1 K/uL Final   Immature Granulocytes 06/15/2021 1  % Final   Abs Immature Granulocytes  06/15/2021 0.08 (A) 0.00 - 0.07 K/uL Final   Sodium 06/15/2021 136  135 - 145 mmol/L Final   Potassium 06/15/2021 4.9  3.5 - 5.1 mmol/L Final   Chloride 06/15/2021 105  98 - 111 mmol/L Final   CO2 06/15/2021 24  22 - 32 mmol/L Final   Glucose, Bld 06/15/2021 247 (A) 70 - 99 mg/dL  Final   BUN 06/15/2021 28 (A) 8 - 23 mg/dL Final   Creatinine, Ser 06/15/2021 0.89  0.44 - 1.00 mg/dL Final   Calcium 08/65/7846 9.5  8.9 - 10.3 mg/dL Final   Total Protein 96/29/5284 7.3  6.5 - 8.1 g/dL Final   Albumin 13/24/4010 4.0  3.5 - 5.0 g/dL Final   AST 27/25/3664 23  15 - 41 U/L Final   ALT 06/15/2021 23  0 - 44 U/L Final   Alkaline Phosphatase 06/15/2021 77  38 - 126 U/L Final   Total Bilirubin 06/15/2021 0.3  0.3 - 1.2 mg/dL Final   GFR, Estimated 06/15/2021 >60  >60 mL/min Final   Anion gap 06/15/2021 7  5 - 15 Final   Sodium 06/16/2021 139  135 - 145 mmol/L Final   Potassium 06/16/2021 5.0  3.5 - 5.1 mmol/L Final   Chloride 06/16/2021 110  98 - 111 mmol/L Final   CO2 06/16/2021 18 (A) 22 - 32 mmol/L Final   Glucose, Bld 06/16/2021 303 (A) 70 - 99 mg/dL Final   BUN 40/34/7425 28 (A) 8 - 23 mg/dL Final   Creatinine, Ser 06/16/2021 0.78  0.44 - 1.00 mg/dL Final   Calcium 95/63/8756 9.9  8.9 - 10.3 mg/dL Final   Total Protein 43/32/9518 6.9  6.5 - 8.1 g/dL Final   Albumin 84/16/6063 3.7  3.5 - 5.0 g/dL Final   AST 01/60/1093 31  15 - 41 U/L Final   ALT 06/16/2021 24  0 - 44 U/L Final   Alkaline Phosphatase 06/16/2021 70  38 - 126 U/L Final   Total Bilirubin 06/16/2021 0.5  0.3 - 1.2 mg/dL Final   GFR, Estimated 06/16/2021 >60  >60 mL/min Final   Anion gap 06/16/2021 11  5 - 15 Final   WBC 06/16/2021 9.4  4.0 - 10.5 K/uL Final   RBC 06/16/2021 4.09  3.87 - 5.11 MIL/uL Final   Hemoglobin 06/16/2021 11.8 (A) 12.0 - 15.0 g/dL Final   HCT 23/55/7322 37.1  36.0 - 46.0 % Final   MCV 06/16/2021 90.7  80.0 - 100.0 fL Final   MCH 06/16/2021 28.9  26.0 - 34.0 pg Final   MCHC 06/16/2021 31.8  30.0 - 36.0  g/dL Final   RDW 02/54/2706 13.3  11.5 - 15.5 % Final   Platelets 06/16/2021 170  150 - 400 K/uL Final   nRBC 06/16/2021 0.0  0.0 - 0.2 % Final   Glucose-Capillary 06/15/2021 361 (A) 70 - 99 mg/dL Final   MRSA by PCR Next Gen 06/15/2021 NOT DETECTED  NOT DETECTED Final   Glucose-Capillary 06/16/2021 272 (A) 70 - 99 mg/dL Final   Glucose-Capillary 06/16/2021 190 (A) 70 - 99 mg/dL Final   Glucose-Capillary 06/16/2021 235 (A) 70 - 99 mg/dL Final   WBC 23/76/2831 9.3  4.0 - 10.5 K/uL Final   RBC 06/17/2021 2.67 (A) 3.87 - 5.11 MIL/uL Final   Hemoglobin 06/17/2021 7.7 (A) 12.0 - 15.0 g/dL Final   HCT 51/76/1607 24.3 (A) 36.0 - 46.0 % Final   MCV 06/17/2021 91.0  80.0 - 100.0 fL Final   MCH 06/17/2021 28.8  26.0 - 34.0 pg Final   MCHC 06/17/2021 31.7  30.0 - 36.0 g/dL Final   RDW 37/07/6268 13.6  11.5 - 15.5 % Final   Platelets 06/17/2021 169  150 - 400 K/uL Final   nRBC 06/17/2021 0.0  0.0 - 0.2 % Final   Sodium 06/17/2021 134 (A) 135 - 145 mmol/L Final   Potassium 06/17/2021 4.6  3.5 - 5.1 mmol/L Final   Chloride 06/17/2021 104  98 - 111 mmol/L Final   CO2 06/17/2021 23  22 - 32 mmol/L Final   Glucose, Bld 06/17/2021 246 (A) 70 - 99 mg/dL Final   BUN 16/07/9603 29 (A) 8 - 23 mg/dL Final   Creatinine, Ser 06/17/2021 0.92  0.44 - 1.00 mg/dL Final   Calcium 54/06/8118 8.4 (A) 8.9 - 10.3 mg/dL Final   GFR, Estimated 06/17/2021 >60  >60 mL/min Final   Anion gap 06/17/2021 7  5 - 15 Final   Glucose-Capillary 06/16/2021 242 (A) 70 - 99 mg/dL Final     Recent Results (from the past 240 hour(s))  Resp Panel by RT-PCR (Flu A&B, Covid) Nasopharyngeal Swab     Status: None   Collection Time: 06/15/21  7:28 PM   Specimen: Nasopharyngeal Swab; Nasopharyngeal(NP) swabs in vial transport medium  Result Value Ref Range Status   SARS Coronavirus 2 by RT PCR NEGATIVE NEGATIVE Final    Comment: (NOTE) SARS-CoV-2 target nucleic acids are NOT DETECTED.  The SARS-CoV-2 RNA is generally detectable in  upper respiratory specimens during the acute phase of infection. The lowest concentration of SARS-CoV-2 viral copies this assay can detect is 138 copies/mL. A negative result does not preclude SARS-Cov-2 infection and should not be used as the sole basis for treatment or other patient management decisions. A negative result may occur with  improper specimen collection/handling, submission of specimen other than nasopharyngeal swab, presence of viral mutation(s) within the areas targeted by this assay, and inadequate number of viral copies(<138 copies/mL). A negative result must be combined with clinical observations, patient history, and epidemiological information. The expected result is Negative.  Fact Sheet for Patients:  BloggerCourse.com  Fact Sheet for Healthcare Providers:  SeriousBroker.it  This test is no t yet approved or cleared by the [place] FDA and  has been authorized for detection and/or diagnosis of SARS-CoV-2 by FDA under an Emergency Use Authorization (EUA). This EUA will remain  in effect (meaning this test can be used) for the duration of the COVID-19 declaration under Section 564(b)(1) of the Act, 21 U.S.C.section 360bbb-3(b)(1), unless the authorization is terminated  or revoked sooner.       Influenza A by PCR NEGATIVE NEGATIVE Final   Influenza B by PCR NEGATIVE NEGATIVE Final    Comment: (NOTE) The Xpert Xpress SARS-CoV-2/FLU/RSV plus assay is intended as an aid in the diagnosis of influenza from Nasopharyngeal swab specimens and should not be used as a sole basis for treatment. Nasal washings and aspirates are unacceptable for Xpert Xpress SARS-CoV-2/FLU/RSV testing.  Fact Sheet for Patients: BloggerCourse.com  Fact Sheet for Healthcare Providers: SeriousBroker.it  This test is not yet approved or cleared by the [place] FDA and has been  authorized for detection and/or diagnosis of SARS-CoV-2 by FDA under an Emergency Use Authorization (EUA). This EUA will remain in effect (meaning this test can be used) for the duration of the COVID-19 declaration under Section 564(b)(1) of the Act, 21 U.S.C. section 360bbb-3(b)(1), unless the authorization is terminated or revoked.  Performed at [facility], [address], [place], [place] [address]   MRSA Next Gen by PCR, Nasal     Status: None   Collection Time: 06/15/21 11:58 PM   Specimen: Nasal Mucosa; Nasal Swab  Result Value Ref Range Status   MRSA by PCR Next Gen NOT DETECTED NOT DETECTED Final    Comment: (NOTE) The GeneXpert MRSA Assay (FDA approved for  NASAL specimens only), is one component of a comprehensive MRSA colonization surveillance program. It is not intended to diagnose MRSA infection nor to guide or monitor treatment for MRSA infections. Test performance is not FDA approved in patients less than 47 years old. Performed at [facility], [address] [place], [place] [address]      Imaging Studies  DG C-Arm 1-60 Min-No Report  Result Date: 06/16/2021 Fluoroscopy was utilized by the requesting physician.  No radiographic interpretation.   DG C-Arm 1-60 Min-No Report  Result Date: 06/16/2021 Fluoroscopy was utilized by the requesting physician.  No radiographic interpretation.   DG FEMUR MIN 2 VIEWS LEFT  Result Date: 06/16/2021 CLINICAL DATA:  Hip fracture EXAM: LEFT FEMUR 2 VIEWS COMPARISON:  CT 06/15/2021 FINDINGS: A series of fluoroscopic spot images taken intraoperatively document sliding screw and IM rod fixation across the femoral neck, fragments in near anatomic alignment. Fracture deformity of the distal femur is noted. IMPRESSION: Orthopedic fixation of left femoral neck fracture. Electronically Signed   By: Corlis Leak M.D.   On: 06/16/2021 12:02   Medications   Scheduled Meds:  acetaminophen  500 mg Oral  Q6H   carvedilol  12.5 mg Oral BID WC   enoxaparin (LOVENOX) injection  40 mg Subcutaneous Q24H   ferrous sulfate  325 mg Oral TID PC   insulin aspart  0-15 Units Subcutaneous TID WC   insulin detemir  30 Units Subcutaneous QHS   lacosamide  50 mg Oral BID   lisinopril  2.5 mg Oral Daily   NIFEdipine  30 mg Oral Daily   polyethylene glycol  17 g Oral BID   senna  1 tablet Oral BID   simvastatin  10 mg Oral QHS   Continuous Infusions:  0.45 % NaCl with KCl 20 mEq / L 75 mL/hr at 06/16/21 1334     LOS: 2 days   Time spent: >46min   Leeroy Bock, DO [facility] 06/17/2021, 7:23 AM   To contact the [facility] Attending or Consulting provider for this patient: Check the care team in [facility] for a) attending/consulting TRH provider listed and b) the [facility] team listed Log into [email or web address] and use [place]'s universal password to access. If you do not have the password, please contact the hospital operator. Locate the [facility] provider you are looking for under [facility] and page to a number that you can be directly reached. If you still have difficulty reaching the provider, please page the [facility] (Director on Call) for the Hospitalists listed on amion for assistance.

## 2021-06-18 LAB — CBC
HCT: 25.6 % — ABNORMAL LOW (ref 36.0–46.0)
Hemoglobin: 8.1 g/dL — ABNORMAL LOW (ref 12.0–15.0)
MCH: 29.1 pg (ref 26.0–34.0)
MCHC: 31.6 g/dL (ref 30.0–36.0)
MCV: 92.1 fL (ref 80.0–100.0)
Platelets: 166 10*3/uL (ref 150–400)
RBC: 2.78 MIL/uL — ABNORMAL LOW (ref 3.87–5.11)
RDW: 13.6 % (ref 11.5–15.5)
WBC: 7.9 10*3/uL (ref 4.0–10.5)
nRBC: 0 % (ref 0.0–0.2)

## 2021-06-18 LAB — BASIC METABOLIC PANEL
Anion gap: 8 (ref 5–15)
BUN: 21 mg/dL (ref 8–23)
CO2: 24 mmol/L (ref 22–32)
Calcium: 8.9 mg/dL (ref 8.9–10.3)
Chloride: 105 mmol/L (ref 98–111)
Creatinine, Ser: 0.84 mg/dL (ref 0.44–1.00)
GFR, Estimated: >60 mL/min (ref >60)
Glucose, Bld: 182 mg/dL — ABNORMAL HIGH (ref 70–99)
Potassium: 4 mmol/L (ref 3.5–5.1)
Sodium: 137 mmol/L (ref 135–145)

## 2021-06-18 LAB — RESP PANEL BY RT-PCR (FLU A&B, COVID) ARPGX2
Influenza A by PCR: NEGATIVE
Influenza B by PCR: NEGATIVE
SARS Coronavirus 2 by RT PCR: NEGATIVE

## 2021-06-18 LAB — GLUCOSE, CAPILLARY
Glucose-Capillary: 116 mg/dL — ABNORMAL HIGH (ref 70–99)
Glucose-Capillary: 261 mg/dL — ABNORMAL HIGH (ref 70–99)

## 2021-06-18 LAB — HEMOGLOBIN A1C
Hgb A1c MFr Bld: 11 % — ABNORMAL HIGH (ref 4.8–5.6)
Mean Plasma Glucose: 269 mg/dL

## 2021-06-18 MED ORDER — HYDROCODONE-ACETAMINOPHEN 5-325 MG PO TABS: 1.0000 | ORAL_TABLET | Freq: Four times a day (QID) | ORAL | 0 refills | Status: AC | PRN

## 2021-06-18 MED ORDER — BISACODYL 10 MG RE SUPP: 10.0000 mg | Freq: Every day | RECTAL | 0 refills | Status: AC | PRN

## 2021-06-18 MED ORDER — ENOXAPARIN SODIUM 40 MG/0.4ML IJ SOSY: 40.0000 mg | PREFILLED_SYRINGE | INTRAMUSCULAR | Status: DC

## 2021-06-18 MED ORDER — ASPIRIN-DIPYRIDAMOLE ER 25-200 MG PO CP12: 1.0000 | ORAL_CAPSULE | Freq: Two times a day (BID) | ORAL | 3 refills | Status: AC

## 2021-06-18 MED ORDER — LEVEMIR 100 UNIT/ML ~~LOC~~ SOLN: 30.0000 [IU] | Freq: Every day | SUBCUTANEOUS | 11 refills | Status: DC

## 2021-06-18 MED ORDER — CARVEDILOL 12.5 MG PO TABS: 12.5000 mg | ORAL_TABLET | Freq: Two times a day (BID) | ORAL | Status: DC

## 2021-06-18 MED ORDER — FERROUS SULFATE 325 (65 FE) MG PO TABS: 325.0000 mg | ORAL_TABLET | Freq: Three times a day (TID) | ORAL | 3 refills | Status: AC

## 2021-06-18 NOTE — TOC Transition Note (Signed)
Transition of Care [facility]) - CM/SW Discharge Note   Patient Details  Name: PRESLYNN BIER MRN: [id number] Date of Birth: [birth year]  Transition of Care [facility]) CM/SW Contact:  Amada Jupiter, LCSW Phone Number: 06/18/2021, 11:52 AM   Clinical Narrative:     Pt medically cleared for dc today and return to [facility].  Pt/son alerted and agreeable.  PTAR called at 11:50am.  RN to call report to [phone].  No further TOC needs.  Final next level of care: Skilled Nursing Facility Barriers to Discharge: Barriers Resolved   Patient Goals and CMS Choice Patient states their goals for this hospitalization and ongoing recovery are:: Return to [facility] for short-term rehab CMS Medicare.gov Compare Post Acute Care list provided to:: Patient Represenative (must comment) Lavine Hargrove (son)) Choice offered to / list presented to : Adult Children  Discharge Placement   Existing PASRR number confirmed : 06/17/21          Patient [facility] bed at: [facility] Patient to be transferred to facility by: PTAR Name of family member notified: son, Elijah Birk Patient and family notified of of transfer: 06/18/21  Discharge Plan and Services In-house Referral: Clinical Social Work   Post Acute Care Choice: Skilled Nursing Facility          DME Arranged: N/A DME Agency: NA                  Social Determinants of Health (SDOH) Interventions     Readmission Risk Interventions No flowsheet data found.

## 2021-06-18 NOTE — Discharge Summary (Signed)
Physician Discharge Summary  Yvette Gutierrez FTD:[id number] DOB: Dec 09, 1945 DOA: 06/15/2021  PCP: Patient, No Pcp Per (Inactive)  Admit date: 06/15/2021 Discharge date: 06/18/2021  Admitted From: Nursing facility Discharge disposition: Back to nursing facility   Code Status: Full Code  Diet Recommendation: Cardiac/diabetic diet  Discharge Diagnosis:   Active Problems:   Essential hypertension   Hyperlipidemia LDL goal <70   DM type 2 with diabetic peripheral neuropathy (HCC)   Seizure disorder (HCC)   Femur fracture (HCC)   History of Present Illness / Brief narrative:  Yvette Gutierrez is a 75 y.o. female with PMH significant for dementia, CVA, diabetes mellitus type 2, previous distal left femur fracture which was treated non-operatively, HTN, HLD, seizure disorder, depression. They presented from SNF to the ED on 06/15/2021 with acute Left femur fx.   In the ED, it was found that she was found to have left subtrochanteric femur fx and healed fx of distal L femur on CT. Ortho was consulted for surgical fixation. Admitted to hospitalist service 9/4, patient underwent surgical fixation   Subjective:  Seen and examined this morning.  Pleasant elderly Caucasian female.  Lying down in bed.  Not in distress.  Taking her breakfast.  Baseline dementia.  Slow to respond.  Not restless or agitated.  Hospital Course:  Left subtrochanteric femur fracture -9/4, underwent surgical fixation by orthopedics. -Continue pain control with Norco. -DVT prophylaxis - Lovenox 40 mg daily for next 30 days per orthopedics.  Postoperative drop in hemoglobin -Postoperatively, patient's hemoglobin dropped significantly from 12.5 to 7.7.  She was monitored for next 24 hours.  No other active bleeding noted.  Hemoglobin remained stable.  She did not require any transfusion.  Patient will be on Lovenox for DVT prophylaxis for 30 days.  Needs to be monitored for bleeding at the facility.  Can repeat CBC next 1  week. -Initiated on iron supplement. Recent Labs    06/15/21 1928 06/16/21 0747 06/17/21 0357 06/17/21 1735 06/18/21 0353  HGB 12.5 11.8* 7.7* 7.9* 8.1*  MCV 91.4 90.7 91.0 93.4 92.1   Uncontrolled type 2 diabetes mellitus Hyperglycemia -A1c 11 on 06/17/2021 -Home meds include Levemir 25 units at bedtime, metformin 1000 mg daily -Fingerstick this morning showed glucose level of 116.  I will increase Lantus to 30 units at bedtime and continue metformin.  Dietary control recommended. Recent Labs  Lab 06/17/21 0733 06/17/21 1117 06/17/21 1625 06/17/21 2044 06/18/21 0727  GLUCAP 175* 244* 192* 211* 116*   Essential hypertension -Continue antihypertensives - Coreg 12.5 mg daily, lisinopril 2.5 mg daily, nifedipine 30 mg daily.  H/o CVA, HLD -Previously on Aggrenox 200/25 mg twice daily and simvastatin 10 mg daily. -Since patient will be on Lovenox for next 1 month, I would keep Aggrenox on hold till that time and resume after that.  Continue simvastatin.   Seizure disorder - continue Vimpat 50 mg twice daily  Dementia Depression -Continue supportive measures.  Delirium precautions continue Effexor    Allergies as of 06/18/2021   No Known Allergies      Medication List     STOP taking these medications    acetaminophen 325 MG tablet Commonly known as: TYLENOL   furosemide 20 MG tablet Commonly known as: LASIX       TAKE these medications    b complex vitamins capsule Take 1 capsule by mouth daily.   bisacodyl 10 MG suppository Commonly known as: DULCOLAX Place 1 suppository (10 mg total) rectally daily as needed  for moderate constipation.   Calmoseptine 0.44-20.6 % Oint Generic drug: Menthol-Zinc Oxide Apply 1 application topically in the morning and at bedtime.   carvedilol 12.5 MG tablet Commonly known as: COREG Take 1 tablet (12.5 mg total) by mouth 2 (two) times daily with a meal. What changed: how much to take   dipyridamole-aspirin 200-25 MG  12hr capsule Commonly known as: AGGRENOX Take 1 capsule by mouth 2 (two) times daily. Start taking on: July 17, 2021 What changed: These instructions start on July 17, 2021. If you are unsure what to do until then, ask your doctor or other care provider.   enoxaparin 40 MG/0.4ML injection Commonly known as: LOVENOX Inject 0.4 mLs (40 mg total) into the skin daily for 30 doses. For 30 days post op for DVT prophylaxis   ferrous sulfate 325 (65 FE) MG tablet Take 1 tablet (325 mg total) by mouth 3 (three) times daily after meals.   HYDROcodone-acetaminophen 5-325 MG tablet Commonly known as: NORCO/VICODIN Take 1 tablet by mouth every 6 (six) hours as needed for up to 5 days for severe pain.   lacosamide 50 MG Tabs tablet Commonly known as: VIMPAT Take one tablet by mouth every 12 hours   Levemir 100 UNIT/ML injection Generic drug: insulin detemir Inject 0.3 mLs (30 Units total) into the skin at bedtime. What changed: how much to take   Lidocaine 4 % Ptch Apply 1 patch topically daily.   lisinopril 2.5 MG tablet Commonly known as: ZESTRIL Take 2.5 mg by mouth daily. What changed: Another medication with the same name was removed. Continue taking this medication, and follow the directions you see here.   metFORMIN 500 MG 24 hr tablet Commonly known as: GLUCOPHAGE-XR Take 1,000 mg by mouth 2 (two) times daily. What changed: Another medication with the same name was removed. Continue taking this medication, and follow the directions you see here.   NIFEdipine 30 MG 24 hr tablet Commonly known as: PROCARDIA-XL/NIFEDICAL-XL Take 30 mg by mouth daily.   NovoLOG 100 UNIT/ML injection Generic drug: insulin aspart Inject 0-12 Units into the skin as directed. If BS<70, CALL NP/PA. If BS is 70-200=0 units, 201-250=2 units, 251-300=4 units, 301-350=6 units, 351-400=8 units, 401-450=10 units, 451-600=12 units, IF CBG>450 GIVE THE 12 UNITS, RECHECK IN 2 HRS, IF STILL>350 NOTIFY  PROVIDER   sennosides-docusate sodium 8.6-50 MG tablet Commonly known as: SENOKOT-S Take 2 tablets by mouth at bedtime.   simvastatin 10 MG tablet Commonly known as: ZOCOR Take 10 mg by mouth at bedtime. What changed: Another medication with the same name was removed. Continue taking this medication, and follow the directions you see here.   venlafaxine XR 37.5 MG 24 hr capsule Commonly known as: EFFEXOR-XR Take 37.5 mg by mouth daily with breakfast.               Discharge Care Instructions  (From admission, onward)           Start     Ordered   06/18/21 0000  Leave dressing on - Keep it clean, dry, and intact until clinic visit        06/18/21 1008            Discharge Instructions:  Follow with Primary MD Patient, No Pcp Per (Inactive) in 7 days   Get CBC/BMP checked in next visit within 1 week by PCP or SNF MD ( we routinely change or add medications that can affect your baseline labs and fluid status, therefore we recommend that  you get the mentioned basic workup next visit with your PCP, your PCP may decide not to get them or add new tests based on their clinical decision)  On your next visit with your PCP, please Get Medicines reviewed and adjusted.  Please request your PCP  to go over all Hospital Tests and Procedure/Radiological results at the follow up, please get all Hospital records sent to your Prim MD by signing hospital release before you go home.  Activity: As tolerated with Full fall precautions use walker/cane & assistance as needed  For Heart failure patients - Check your Weight same time everyday, if you gain over 2 pounds, or you develop in leg swelling, experience more shortness of breath or chest pain, call your Primary MD immediately. Follow Cardiac Low Salt Diet and 1.5 lit/day fluid restriction.  If you have smoked or chewed Tobacco in the last 2 yrs please stop smoking, stop any regular Alcohol  and or any Recreational drug use.  If  you experience worsening of your admission symptoms, develop shortness of breath, life threatening emergency, suicidal or homicidal thoughts you must seek medical attention immediately by calling 911 or calling your MD immediately  if symptoms less severe.  You Must read complete instructions/literature along with all the possible adverse reactions/side effects for all the Medicines you take and that have been prescribed to you. Take any new Medicines after you have completely understood and accpet all the possible adverse reactions/side effects.   Do not drive, operate heavy machinery, perform activities at heights, swimming or participation in water activities or provide baby sitting services if your were admitted for syncope or siezures until you have seen by Primary MD or a Neurologist and advised to do so again.  Do not drive when taking Pain medications.  Do not take more than prescribed Pain, Sleep and Anxiety Medications  Wear Seat belts while driving.   Please note You were cared for by a hospitalist during your hospital stay. If you have any questions about your discharge medications or the care you received while you were in the hospital after you are discharged, you can call the unit and asked to speak with the hospitalist on call if the hospitalist that took care of you is not available. Once you are discharged, your primary care physician will handle any further medical issues. Please note that NO REFILLS for any discharge medications will be authorized once you are discharged, as it is imperative that you return to your primary care physician (or establish a relationship with a primary care physician if you do not have one) for your aftercare needs so that they can reassess your need for medications and monitor your lab values.    Follow ups:   Discharge Instructions     Diet - low sodium heart healthy   Complete by: As directed    Diet Carb Modified   Complete by: As directed     Increase activity slowly   Complete by: As directed    Leave dressing on - Keep it clean, dry, and intact until clinic visit   Complete by: As directed        Follow-up Information      [facility] Follow up.   Contact information: [address] [place] [id number] [phone]        Teryl Lucy, MD Follow up.   Specialty: Orthopedic Surgery Contact information: [address] [place] [address] [phone]  Wound care:   Incision (Closed) 06/16/21 Hip Left (Active)  Date First Assessed/Time First Assessed: 06/16/21 1141   Location: Hip  Location Orientation: Left    Assessments 06/16/2021 12:00 PM 06/17/2021 12:33 PM  Dressing Type Other (Comment) Silver hydrofiber  Dressing Clean;Dry;Intact Changed  Site / Wound Assessment Dressing in place / Unable to assess Clean;Dry  Margins -- Attached edges (approximated)  Closure -- Adhesive strips  Drainage Amount None None     No Linked orders to display    Discharge Exam:   Vitals:   06/17/21 2038 06/18/21 0054 06/18/21 0428 06/18/21 0838  BP: (!) 101/46 (!) 134/39 (!) 150/55 (!) 156/71  Pulse: 84 78 81 86  Resp: 18 17 16    Temp: 98.1 F (36.7 C) 98.6 F (37 C) 98.4 F (36.9 C)   TempSrc: Oral Axillary Oral   SpO2: 98% 100% 99%   Weight:      Height:        Body mass index is 35.95 kg/m.  General exam: Pleasant, elderly Caucasian female.  Not in physical distress Skin: No rashes, lesions or ulcers. HEENT: Atraumatic, normocephalic, no obvious bleeding Lungs: Clear to auscultation bilaterally CVS: Regular rate and rhythm, no murmur GI/Abd soft, nontender, nondistended, bowel sound present CNS: Alert, awake, knows he is in the hospital.  Has dementia at baseline Psychiatry: Mood appropriate Extremities: No pedal edema, no calf tenderness  Time coordinating discharge: 35 minutes   The results of  significant diagnostics from this hospitalization (including imaging, microbiology, ancillary and laboratory) are listed below for reference.    Procedures and Diagnostic Studies:   DG Chest 1 View  Result Date: 06/15/2021 CLINICAL DATA:  Combative, kicked bedside table. EXAM: CHEST  1 VIEW COMPARISON:  Chest radiograph dated 10/07/2014. FINDINGS: The heart is enlarged. There is minimal scarring in the left lower lung laterally, unchanged. The right lung is clear. There is no pleural effusion or pneumothorax. Degenerative changes are seen in the spine. Chronic deformities are seen of both shoulders. IMPRESSION: No acute pulmonary process. Electronically Signed   By: Romona Curls M.D.   On: 06/15/2021 19:45   DG Pelvis 1-2 Views  Result Date: 06/15/2021 CLINICAL DATA:  Broken hip after attempting to cake a bedside table. EXAM: PELVIS - 1-2 VIEW COMPARISON:  None. FINDINGS: There is an acute fracture of the subtrochanteric left femur. There is no hip dislocation. Stool overlies the rectum. IMPRESSION: Acute subtrochanteric fracture of the left femur. Electronically Signed   By: Romona Curls M.D.   On: 06/15/2021 19:41   CT KNEE LEFT WO CONTRAST  Result Date: 06/15/2021 CLINICAL DATA:  Is left hip fracture.  Abnormal x-ray. EXAM: CT OF THE left KNEE WITHOUT CONTRAST TECHNIQUE: Multidetector CT imaging of the left knee was performed according to the standard protocol. Multiplanar CT image reconstructions were also generated. COMPARISON:  Left femur radiographs 06/15/2021 FINDINGS: Bones/Joint/Cartilage Remote healed comminuted distal femur fracture again noted. An acute fracture through the healed component is nondisplaced, best seen on the coronal images. This is through the healed bone mass connecting the non fractured femoral shaft to the femoral condyles. Ligaments Suboptimally assessed by CT. Muscles and Tendons Unremarkable. Soft tissues No other soft tissue injury. IMPRESSION: 1. Remote healed  comminuted distal femur fracture. 2. Acute nondisplaced fracture through the healed component of the distal femur fracture. Electronically Signed   By: Marin Roberts M.D.   On: 06/15/2021 21:29   DG C-Arm 1-60 Min-No Report  Result Date: 06/16/2021 Fluoroscopy was  utilized by the requesting physician.  No radiographic interpretation.   DG C-Arm 1-60 Min-No Report  Result Date: 06/16/2021 Fluoroscopy was utilized by the requesting physician.  No radiographic interpretation.   DG FEMUR MIN 2 VIEWS LEFT  Addendum Date: 06/16/2021   ADDENDUM REPORT: 06/16/2021 19:01 COMPARISON:  10/06/2014 FINDINGS: The distal femoral fracture is old, noted on prior study with healing and deformity. IMPRESSION: Comparing current study to old study, the distal femoral fracture is old healed fracture. Electronically Signed   By: Charlett Nose M.D.   On: 06/16/2021 19:01   Result Date: 06/16/2021 CLINICAL DATA:  Broken hip after attempting to cake a bedside table. EXAM: LEFT FEMUR 2 VIEWS COMPARISON:  None. FINDINGS: There is an acute fracture of the subtrochanteric left femur. There is a comminuted fracture of the distal femur which appears to involve the intercondylar region and extend to the knee joint, however this is incompletely characterized given limited views of the fracture. IMPRESSION: 1.  Acute subtrochanteric fracture of the left femur. 2. Acute distal left femoral fracture appears to involve the intercondylar region and extend to the knee joint. Dedicated knee radiographs could be helpful for further characterization. Electronically Signed: By: Romona Curls M.D. On: 06/15/2021 19:43   DG FEMUR MIN 2 VIEWS LEFT  Result Date: 06/16/2021 CLINICAL DATA:  Hip fracture EXAM: LEFT FEMUR 2 VIEWS COMPARISON:  CT 06/15/2021 FINDINGS: A series of fluoroscopic spot images taken intraoperatively document sliding screw and IM rod fixation across the femoral neck, fragments in near anatomic alignment. Fracture deformity  of the distal femur is noted. IMPRESSION: Orthopedic fixation of left femoral neck fracture. Electronically Signed   By: Corlis Leak M.D.   On: 06/16/2021 12:02     Labs:   Basic Metabolic Panel: Recent Labs  Lab 06/15/21 1928 06/16/21 0747 06/17/21 0357 06/18/21 0353  NA 136 139 134* 137  K 4.9 5.0 4.6 4.0  CL 105 110 104 105  CO2 24 18* 23 24  GLUCOSE 247* 303* 246* 182*  BUN 28* 28* 29* 21  CREATININE 0.89 0.78 0.92 0.84  CALCIUM 9.5 9.9 8.4* 8.9   GFR Estimated Creatinine Clearance: 55.5 mL/min (by C-G formula based on SCr of 0.84 mg/dL). Liver Function Tests: Recent Labs  Lab 06/15/21 1928 06/16/21 0747  AST 23 31  ALT 23 24  ALKPHOS 77 70  BILITOT 0.3 0.5  PROT 7.3 6.9  ALBUMIN 4.0 3.7   No results for input(s): LIPASE, AMYLASE in the last 168 hours. No results for input(s): AMMONIA in the last 168 hours. Coagulation profile No results for input(s): INR, PROTIME in the last 168 hours.  CBC: Recent Labs  Lab 06/15/21 1928 06/16/21 0747 06/17/21 0357 06/17/21 1735 06/18/21 0353  WBC 12.2* 9.4 9.3 8.2 7.9  NEUTROABS 9.8*  --   --   --   --   HGB 12.5 11.8* 7.7* 7.9* 8.1*  HCT 39.2 37.1 24.3* 25.3* 25.6*  MCV 91.4 90.7 91.0 93.4 92.1  PLT 202 170 169 194 166   Cardiac Enzymes: No results for input(s): CKTOTAL, CKMB, CKMBINDEX, TROPONINI in the last 168 hours. BNP: Invalid input(s): POCBNP CBG: Recent Labs  Lab 06/17/21 0733 06/17/21 1117 06/17/21 1625 06/17/21 2044 06/18/21 0727  GLUCAP 175* 244* 192* 211* 116*   D-Dimer No results for input(s): DDIMER in the last 72 hours. Hgb A1c Recent Labs    06/17/21 0357  HGBA1C 11.0*   Lipid Profile No results for input(s): CHOL, HDL, LDLCALC, TRIG, CHOLHDL, LDLDIRECT  in the last 72 hours. Thyroid function studies No results for input(s): TSH, T4TOTAL, T3FREE, THYROIDAB in the last 72 hours.  Invalid input(s): FREET3 Anemia work up No results for input(s): VITAMINB12, FOLATE, FERRITIN, TIBC,  IRON, RETICCTPCT in the last 72 hours. Microbiology Recent Results (from the past 240 hour(s))  Resp Panel by RT-PCR (Flu A&B, Covid) Nasopharyngeal Swab     Status: None   Collection Time: 06/15/21  7:28 PM   Specimen: Nasopharyngeal Swab; Nasopharyngeal(NP) swabs in vial transport medium  Result Value Ref Range Status   SARS Coronavirus 2 by RT PCR NEGATIVE NEGATIVE Final    Comment: (NOTE) SARS-CoV-2 target nucleic acids are NOT DETECTED.  The SARS-CoV-2 RNA is generally detectable in upper respiratory specimens during the acute phase of infection. The lowest concentration of SARS-CoV-2 viral copies this assay can detect is 138 copies/mL. A negative result does not preclude SARS-Cov-2 infection and should not be used as the sole basis for treatment or other patient management decisions. A negative result may occur with  improper specimen collection/handling, submission of specimen other than nasopharyngeal swab, presence of viral mutation(s) within the areas targeted by this assay, and inadequate number of viral copies(<138 copies/mL). A negative result must be combined with clinical observations, patient history, and epidemiological information. The expected result is Negative.  Fact Sheet for Patients:  BloggerCourse.com  Fact Sheet for Healthcare Providers:  SeriousBroker.it  This test is no t yet approved or cleared by the [place] FDA and  has been authorized for detection and/or diagnosis of SARS-CoV-2 by FDA under an Emergency Use Authorization (EUA). This EUA will remain  in effect (meaning this test can be used) for the duration of the COVID-19 declaration under Section 564(b)(1) of the Act, 21 U.S.C.section 360bbb-3(b)(1), unless the authorization is terminated  or revoked sooner.       Influenza A by PCR NEGATIVE NEGATIVE Final   Influenza B by PCR NEGATIVE NEGATIVE Final    Comment: (NOTE) The Xpert Xpress  SARS-CoV-2/FLU/RSV plus assay is intended as an aid in the diagnosis of influenza from Nasopharyngeal swab specimens and should not be used as a sole basis for treatment. Nasal washings and aspirates are unacceptable for Xpert Xpress SARS-CoV-2/FLU/RSV testing.  Fact Sheet for Patients: BloggerCourse.com  Fact Sheet for Healthcare Providers: SeriousBroker.it  This test is not yet approved or cleared by the [place] FDA and has been authorized for detection and/or diagnosis of SARS-CoV-2 by FDA under an Emergency Use Authorization (EUA). This EUA will remain in effect (meaning this test can be used) for the duration of the COVID-19 declaration under Section 564(b)(1) of the Act, 21 U.S.C. section 360bbb-3(b)(1), unless the authorization is terminated or revoked.  Performed at [facility], [address], [place], [place] [address]   MRSA Next Gen by PCR, Nasal     Status: None   Collection Time: 06/15/21 11:58 PM   Specimen: Nasal Mucosa; Nasal Swab  Result Value Ref Range Status   MRSA by PCR Next Gen NOT DETECTED NOT DETECTED Final    Comment: (NOTE) The GeneXpert MRSA Assay (FDA approved for NASAL specimens only), is one component of a comprehensive MRSA colonization surveillance program. It is not intended to diagnose MRSA infection nor to guide or monitor treatment for MRSA infections. Test performance is not FDA approved in patients less than 76 years old. Performed at [facility], [address], [place], [place] [address]      Signed: Melina Schools Trevor Wilkie  [facility] 06/18/2021, 10:09 AM

## 2021-06-18 NOTE — Progress Notes (Signed)
Subjective: 2 Days Post-Op s/p Procedure(s): INTRAMEDULLARY (IM) NAIL FEMORAL   Patient is alert, oriented to self only. Denies any pain. Eating breakfast. Says no when asked if she has any chest pain, SOB, Calf pain, nausea/vomiting.    Objective:  PE: VITALS:   Vitals:   06/17/21 2038 06/18/21 0054 06/18/21 0428 06/18/21 0838  BP: (!) 101/46 (!) 134/39 (!) 150/55 (!) 156/71  Pulse: 84 78 81 86  Resp: 18 17 16    Temp: 98.1 F (36.7 C) 98.6 F (37 C) 98.4 F (36.9 C)   TempSrc: Oral Axillary Oral   SpO2: 98% 100% 99%   Weight:      Height:       General: laying in bed, in no acute distress GI: soft, non-tender abdomen MSK: LLE -dorsiflexion plantarflexion intact.  Able to flex and extend all toes.  Patient responds yes to all questions regarding sensation at the foot, endorses sensation to all aspect of foot. Unsure when dressings were last changed, but dressings are C/D/I.  No tenderness to palpation over the left knee. No hematoma formation.  LABS  Results for orders placed or performed during the hospital encounter of 06/15/21 (from the past 24 hour(s))  Glucose, capillary     Status: Abnormal   Collection Time: 06/17/21 11:17 AM  Result Value Ref Range   Glucose-Capillary 244 (H) 70 - 99 mg/dL  Glucose, capillary     Status: Abnormal   Collection Time: 06/17/21  4:25 PM  Result Value Ref Range   Glucose-Capillary 192 (H) 70 - 99 mg/dL  CBC     Status: Abnormal   Collection Time: 06/17/21  5:35 PM  Result Value Ref Range   WBC 8.2 4.0 - 10.5 K/uL   RBC 2.71 (L) 3.87 - 5.11 MIL/uL   Hemoglobin 7.9 (L) 12.0 - 15.0 g/dL   HCT 08/17/21 (L) 93.2 - 35.5 %   MCV 93.4 80.0 - 100.0 fL   MCH 29.2 26.0 - 34.0 pg   MCHC 31.2 30.0 - 36.0 g/dL   RDW 73.2 20.2 - 54.2 %   Platelets 194 150 - 400 K/uL   nRBC 0.0 0.0 - 0.2 %  Glucose, capillary     Status: Abnormal   Collection Time: 06/17/21  8:44 PM  Result Value Ref Range   Glucose-Capillary 211 (H) 70 - 99 mg/dL  CBC      Status: Abnormal   Collection Time: 06/18/21  3:53 AM  Result Value Ref Range   WBC 7.9 4.0 - 10.5 K/uL   RBC 2.78 (L) 3.87 - 5.11 MIL/uL   Hemoglobin 8.1 (L) 12.0 - 15.0 g/dL   HCT 08/18/21 (L) 23.7 - 62.8 %   MCV 92.1 80.0 - 100.0 fL   MCH 29.1 26.0 - 34.0 pg   MCHC 31.6 30.0 - 36.0 g/dL   RDW 31.5 17.6 - 16.0 %   Platelets 166 150 - 400 K/uL   nRBC 0.0 0.0 - 0.2 %  Basic metabolic panel     Status: Abnormal   Collection Time: 06/18/21  3:53 AM  Result Value Ref Range   Sodium 137 135 - 145 mmol/L   Potassium 4.0 3.5 - 5.1 mmol/L   Chloride 105 98 - 111 mmol/L   CO2 24 22 - 32 mmol/L   Glucose, Bld 182 (H) 70 - 99 mg/dL   BUN 21 8 - 23 mg/dL   Creatinine, Ser 08/18/21 0.44 - 1.00 mg/dL   Calcium 8.9 8.9 - 10.3  mg/dL   GFR, Estimated >62 >22 mL/min   Anion gap 8 5 - 15  Glucose, capillary     Status: Abnormal   Collection Time: 06/18/21  7:27 AM  Result Value Ref Range   Glucose-Capillary 116 (H) 70 - 99 mg/dL    DG C-Arm 9-79 Min-No Report  Result Date: 06/16/2021 Fluoroscopy was utilized by the requesting physician.  No radiographic interpretation.   DG C-Arm 1-60 Min-No Report  Result Date: 06/16/2021 Fluoroscopy was utilized by the requesting physician.  No radiographic interpretation.   DG FEMUR MIN 2 VIEWS LEFT  Result Date: 06/16/2021 CLINICAL DATA:  Hip fracture EXAM: LEFT FEMUR 2 VIEWS COMPARISON:  CT 06/15/2021 FINDINGS: A series of fluoroscopic spot images taken intraoperatively document sliding screw and IM rod fixation across the femoral neck, fragments in near anatomic alignment. Fracture deformity of the distal femur is noted. IMPRESSION: Orthopedic fixation of left femoral neck fracture. Electronically Signed   By: Corlis Leak M.D.   On: 06/16/2021 12:02    Assessment/Plan: Left subtrochanteric femur fracture  2 Days Post-Op s/p Procedure(s): INTRAMEDULLARY (IM) NAIL FEMORAL Weightbearing: WBAT, LLE Insicional and dressing care: Reinforce dressings as  needed VTE prophylaxis: Hbg increase 7.9 to 8.1 Pain control:  650 tylenol q 6 hours scheduled Hydrocodone 5/325 1-2 tablets prn severe pain Morphine IV severe pain not controlled by po anaglesics Follow - up plan: 2 weeks with Dr. Dion Saucier Dispo: ok for discharge back to SNF, will plan to discharge with lovenox x 30 days   Contact information:   Weekdays 8-5 [address], [place] [phone] A fter hours and holidays please check [email or web address] for group call information for [facility]  Armida Sans 06/18/2021, 10:02 AM

## 2021-06-20 ENCOUNTER — Encounter (HOSPITAL_COMMUNITY): Payer: Self-pay | Admitting: Orthopedic Surgery

## 2024-02-18 ENCOUNTER — Inpatient Hospital Stay (HOSPITAL_COMMUNITY)
Admission: EM | Admit: 2024-02-18 | Discharge: 2024-02-27 | DRG: 871 | Disposition: A | Discharge status: Skilled Nursing Facility | Source: Skilled Nursing Facility | Attending: Family Medicine | Admitting: Family Medicine

## 2024-02-18 DIAGNOSIS — I130 Hypertensive heart and chronic kidney disease with heart failure and stage 1 through stage 4 chronic kidney disease, or unspecified chronic kidney disease: Secondary | ICD-10-CM | POA: Diagnosis present

## 2024-02-18 DIAGNOSIS — A419 Sepsis, unspecified organism: Secondary | ICD-10-CM | POA: Diagnosis present

## 2024-02-18 DIAGNOSIS — A4159 Other Gram-negative sepsis: Principal | ICD-10-CM | POA: Diagnosis present

## 2024-02-18 DIAGNOSIS — Z66 Do not resuscitate: Secondary | ICD-10-CM | POA: Diagnosis present

## 2024-02-18 DIAGNOSIS — I5033 Acute on chronic diastolic (congestive) heart failure: Secondary | ICD-10-CM | POA: Diagnosis not present

## 2024-02-18 DIAGNOSIS — D696 Thrombocytopenia, unspecified: Secondary | ICD-10-CM | POA: Diagnosis not present

## 2024-02-18 DIAGNOSIS — G9341 Metabolic encephalopathy: Secondary | ICD-10-CM | POA: Diagnosis present

## 2024-02-18 DIAGNOSIS — Z515 Encounter for palliative care: Secondary | ICD-10-CM

## 2024-02-18 DIAGNOSIS — E11649 Type 2 diabetes mellitus with hypoglycemia without coma: Secondary | ICD-10-CM | POA: Diagnosis not present

## 2024-02-18 DIAGNOSIS — E1340 Other specified diabetes mellitus with diabetic neuropathy, unspecified: Secondary | ICD-10-CM | POA: Diagnosis present

## 2024-02-18 DIAGNOSIS — G40409 Other generalized epilepsy and epileptic syndromes, not intractable, without status epilepticus: Secondary | ICD-10-CM | POA: Diagnosis present

## 2024-02-18 DIAGNOSIS — Z794 Long term (current) use of insulin: Secondary | ICD-10-CM

## 2024-02-18 DIAGNOSIS — E8721 Acute metabolic acidosis: Secondary | ICD-10-CM | POA: Diagnosis present

## 2024-02-18 DIAGNOSIS — N3000 Acute cystitis without hematuria: Secondary | ICD-10-CM | POA: Diagnosis not present

## 2024-02-18 DIAGNOSIS — Z7189 Other specified counseling: Secondary | ICD-10-CM | POA: Diagnosis not present

## 2024-02-18 DIAGNOSIS — E669 Obesity, unspecified: Secondary | ICD-10-CM | POA: Diagnosis present

## 2024-02-18 DIAGNOSIS — E1122 Type 2 diabetes mellitus with diabetic chronic kidney disease: Secondary | ICD-10-CM | POA: Diagnosis present

## 2024-02-18 DIAGNOSIS — E1142 Type 2 diabetes mellitus with diabetic polyneuropathy: Secondary | ICD-10-CM | POA: Diagnosis present

## 2024-02-18 DIAGNOSIS — R569 Unspecified convulsions: Principal | ICD-10-CM

## 2024-02-18 DIAGNOSIS — F0153 Vascular dementia, unspecified severity, with mood disturbance: Secondary | ICD-10-CM | POA: Diagnosis present

## 2024-02-18 DIAGNOSIS — I10 Essential (primary) hypertension: Secondary | ICD-10-CM | POA: Diagnosis present

## 2024-02-18 DIAGNOSIS — N170 Acute kidney failure with tubular necrosis: Secondary | ICD-10-CM | POA: Diagnosis present

## 2024-02-18 DIAGNOSIS — R627 Adult failure to thrive: Secondary | ICD-10-CM | POA: Diagnosis not present

## 2024-02-18 DIAGNOSIS — D649 Anemia, unspecified: Secondary | ICD-10-CM | POA: Diagnosis present

## 2024-02-18 DIAGNOSIS — R197 Diarrhea, unspecified: Secondary | ICD-10-CM | POA: Diagnosis not present

## 2024-02-18 DIAGNOSIS — F01C0 Vascular dementia, severe, without behavioral disturbance, psychotic disturbance, mood disturbance, and anxiety: Secondary | ICD-10-CM | POA: Diagnosis not present

## 2024-02-18 DIAGNOSIS — Z993 Dependence on wheelchair: Secondary | ICD-10-CM

## 2024-02-18 DIAGNOSIS — N183 Chronic kidney disease, stage 3 unspecified: Secondary | ICD-10-CM | POA: Diagnosis present

## 2024-02-18 DIAGNOSIS — R7881 Bacteremia: Secondary | ICD-10-CM | POA: Diagnosis not present

## 2024-02-18 DIAGNOSIS — E1165 Type 2 diabetes mellitus with hyperglycemia: Secondary | ICD-10-CM | POA: Diagnosis present

## 2024-02-18 DIAGNOSIS — F329 Major depressive disorder, single episode, unspecified: Secondary | ICD-10-CM | POA: Diagnosis present

## 2024-02-18 DIAGNOSIS — F0150 Vascular dementia without behavioral disturbance: Secondary | ICD-10-CM | POA: Diagnosis not present

## 2024-02-18 DIAGNOSIS — I959 Hypotension, unspecified: Secondary | ICD-10-CM | POA: Diagnosis not present

## 2024-02-18 DIAGNOSIS — G4733 Obstructive sleep apnea (adult) (pediatric): Secondary | ICD-10-CM | POA: Diagnosis present

## 2024-02-18 DIAGNOSIS — E785 Hyperlipidemia, unspecified: Secondary | ICD-10-CM | POA: Diagnosis present

## 2024-02-18 DIAGNOSIS — Z6827 Body mass index (BMI) 27.0-27.9, adult: Secondary | ICD-10-CM

## 2024-02-18 DIAGNOSIS — E11319 Type 2 diabetes mellitus with unspecified diabetic retinopathy without macular edema: Secondary | ICD-10-CM | POA: Diagnosis present

## 2024-02-18 DIAGNOSIS — Z8673 Personal history of transient ischemic attack (TIA), and cerebral infarction without residual deficits: Secondary | ICD-10-CM

## 2024-02-18 DIAGNOSIS — Z7401 Bed confinement status: Secondary | ICD-10-CM

## 2024-02-18 DIAGNOSIS — N390 Urinary tract infection, site not specified: Secondary | ICD-10-CM | POA: Diagnosis present

## 2024-02-18 DIAGNOSIS — E876 Hypokalemia: Secondary | ICD-10-CM | POA: Diagnosis not present

## 2024-02-18 DIAGNOSIS — N1831 Chronic kidney disease, stage 3a: Secondary | ICD-10-CM | POA: Diagnosis present

## 2024-02-18 DIAGNOSIS — J9601 Acute respiratory failure with hypoxia: Secondary | ICD-10-CM | POA: Diagnosis not present

## 2024-02-18 DIAGNOSIS — F03C0 Unspecified dementia, severe, without behavioral disturbance, psychotic disturbance, mood disturbance, and anxiety: Secondary | ICD-10-CM | POA: Diagnosis not present

## 2024-02-18 DIAGNOSIS — G40909 Epilepsy, unspecified, not intractable, without status epilepticus: Secondary | ICD-10-CM | POA: Diagnosis not present

## 2024-02-18 DIAGNOSIS — Z221 Carrier of other intestinal infectious diseases: Secondary | ICD-10-CM

## 2024-02-18 DIAGNOSIS — Z79899 Other long term (current) drug therapy: Secondary | ICD-10-CM

## 2024-02-18 DIAGNOSIS — F0390 Unspecified dementia without behavioral disturbance: Secondary | ICD-10-CM | POA: Diagnosis not present

## 2024-02-18 DIAGNOSIS — Z7984 Long term (current) use of oral hypoglycemic drugs: Secondary | ICD-10-CM

## 2024-02-18 DIAGNOSIS — R6520 Severe sepsis without septic shock: Secondary | ICD-10-CM | POA: Diagnosis present

## 2024-02-18 DIAGNOSIS — I5031 Acute diastolic (congestive) heart failure: Secondary | ICD-10-CM | POA: Diagnosis not present

## 2024-02-18 DIAGNOSIS — Z7902 Long term (current) use of antithrombotics/antiplatelets: Secondary | ICD-10-CM

## 2024-02-18 DIAGNOSIS — Z7982 Long term (current) use of aspirin: Secondary | ICD-10-CM

## 2024-02-18 LAB — CBC WITH DIFFERENTIAL/PLATELET
Abs Immature Granulocytes: 0.04 10*3/uL (ref 0.00–0.07)
Basophils Absolute: 0 10*3/uL (ref 0.0–0.1)
Basophils Relative: 0 %
Eosinophils Absolute: 0 10*3/uL (ref 0.0–0.5)
Eosinophils Relative: 0 %
HCT: 32.1 % — ABNORMAL LOW (ref 36.0–46.0)
Hemoglobin: 9.5 g/dL — ABNORMAL LOW (ref 12.0–15.0)
Immature Granulocytes: 1 %
Lymphocytes Relative: 3 %
Lymphs Abs: 0.2 10*3/uL — ABNORMAL LOW (ref 0.7–4.0)
MCH: 26.4 pg (ref 26.0–34.0)
MCHC: 29.6 g/dL — ABNORMAL LOW (ref 30.0–36.0)
MCV: 89.2 fL (ref 80.0–100.0)
Monocytes Absolute: 0.3 10*3/uL (ref 0.1–1.0)
Monocytes Relative: 4 %
Neutro Abs: 6.3 10*3/uL (ref 1.7–7.7)
Neutrophils Relative %: 92 %
Platelets: 192 10*3/uL (ref 150–400)
RBC: 3.6 MIL/uL — ABNORMAL LOW (ref 3.87–5.11)
RDW: 15.2 % (ref 11.5–15.5)
WBC: 6.9 10*3/uL (ref 4.0–10.5)
nRBC: 0 % (ref 0.0–0.2)

## 2024-02-18 LAB — HEMOGLOBIN A1C
Hgb A1c MFr Bld: 7.9 % — ABNORMAL HIGH (ref 4.8–5.6)
Mean Plasma Glucose: 180.03 mg/dL

## 2024-02-18 LAB — URINALYSIS, W/ REFLEX TO CULTURE (INFECTION SUSPECTED)
Bilirubin Urine: NEGATIVE
Glucose, UA: NEGATIVE mg/dL
Ketones, ur: 5 mg/dL — AB
Nitrite: NEGATIVE
Protein, ur: 100 mg/dL — AB
Specific Gravity, Urine: 1.017 (ref 1.005–1.030)
WBC, UA: >50 WBC/hpf (ref 0–5)
pH: 5 (ref 5.0–8.0)

## 2024-02-18 LAB — COMPREHENSIVE METABOLIC PANEL WITH GFR
ALT: 15 U/L (ref 0–44)
AST: 18 U/L (ref 15–41)
Albumin: 3.2 g/dL — ABNORMAL LOW (ref 3.5–5.0)
Alkaline Phosphatase: 81 U/L (ref 38–126)
Anion gap: 16 — ABNORMAL HIGH (ref 5–15)
BUN: 39 mg/dL — ABNORMAL HIGH (ref 8–23)
CO2: 15 mmol/L — ABNORMAL LOW (ref 22–32)
Calcium: 9.3 mg/dL (ref 8.9–10.3)
Chloride: 105 mmol/L (ref 98–111)
Creatinine, Ser: 1.6 mg/dL — ABNORMAL HIGH (ref 0.44–1.00)
GFR, Estimated: 33 mL/min — ABNORMAL LOW (ref >60)
Glucose, Bld: 278 mg/dL — ABNORMAL HIGH (ref 70–99)
Potassium: 4 mmol/L (ref 3.5–5.1)
Sodium: 136 mmol/L (ref 135–145)
Total Bilirubin: 0.5 mg/dL (ref 0.0–1.2)
Total Protein: 7.4 g/dL (ref 6.5–8.1)

## 2024-02-18 LAB — I-STAT CG4 LACTIC ACID, ED
Lactic Acid, Venous: 3.7 mmol/L (ref 0.5–1.9)
Lactic Acid, Venous: 1.7 mmol/L (ref 0.5–1.9)

## 2024-02-18 LAB — RESP PANEL BY RT-PCR (RSV, FLU A&B, COVID)  RVPGX2
Influenza A by PCR: NEGATIVE
Influenza B by PCR: NEGATIVE
Resp Syncytial Virus by PCR: NEGATIVE
SARS Coronavirus 2 by RT PCR: NEGATIVE

## 2024-02-18 LAB — GLUCOSE, CAPILLARY: Glucose-Capillary: 244 mg/dL — ABNORMAL HIGH (ref 70–99)

## 2024-02-18 MED ORDER — ALBUTEROL SULFATE (2.5 MG/3ML) 0.083% IN NEBU
2.5000 mg | INHALATION_SOLUTION | Freq: Four times a day (QID) | RESPIRATORY_TRACT | Status: DC | PRN
  Filled 2024-02-18: qty 3

## 2024-02-18 MED ORDER — ENOXAPARIN SODIUM 40 MG/0.4ML IJ SOSY
40.0000 mg | PREFILLED_SYRINGE | Freq: Every day | INTRAMUSCULAR | Status: DC
  Administered 2024-02-18: 40 mg via SUBCUTANEOUS
  Filled 2024-02-18: qty 0.4

## 2024-02-18 MED ORDER — SENNOSIDES-DOCUSATE SODIUM 8.6-50 MG PO TABS
2.0000 | ORAL_TABLET | Freq: Every day | ORAL | Status: DC
  Filled 2024-02-18 (×2): qty 2

## 2024-02-18 MED ORDER — ACETAMINOPHEN 650 MG RE SUPP
650.0000 mg | Freq: Four times a day (QID) | RECTAL | Status: DC | PRN
  Administered 2024-02-20: 650 mg via RECTAL
  Filled 2024-02-18: qty 1

## 2024-02-18 MED ORDER — HYDRALAZINE HCL 20 MG/ML IJ SOLN
10.0000 mg | Freq: Four times a day (QID) | INTRAMUSCULAR | Status: DC | PRN
  Administered 2024-02-22 – 2024-02-23 (×2): 10 mg via INTRAVENOUS
  Filled 2024-02-18 (×2): qty 1

## 2024-02-18 MED ORDER — FERROUS SULFATE 325 (65 FE) MG PO TABS
325.0000 mg | ORAL_TABLET | Freq: Every day | ORAL | Status: DC
  Administered 2024-02-19: 325 mg via ORAL
  Filled 2024-02-18 (×2): qty 1

## 2024-02-18 MED ORDER — SODIUM CHLORIDE 0.9 % IV SOLN: 1.0000 g | Freq: Once | INTRAVENOUS | Status: DC

## 2024-02-18 MED ORDER — ACETAMINOPHEN 325 MG PO TABS
650.0000 mg | ORAL_TABLET | Freq: Four times a day (QID) | ORAL | Status: DC | PRN
  Administered 2024-02-19: 650 mg via ORAL
  Filled 2024-02-18: qty 2

## 2024-02-18 MED ORDER — INSULIN ASPART 100 UNIT/ML IJ SOLN
0.0000 [IU] | Freq: Three times a day (TID) | INTRAMUSCULAR | Status: DC
  Administered 2024-02-19: 7 [IU] via SUBCUTANEOUS
  Administered 2024-02-19: 1 [IU] via SUBCUTANEOUS
  Administered 2024-02-19: 3 [IU] via SUBCUTANEOUS
  Administered 2024-02-20: 5 [IU] via SUBCUTANEOUS
  Administered 2024-02-20: 7 [IU] via SUBCUTANEOUS
  Administered 2024-02-20: 5 [IU] via SUBCUTANEOUS
  Administered 2024-02-21 (×2): 7 [IU] via SUBCUTANEOUS
  Administered 2024-02-21 – 2024-02-22 (×3): 5 [IU] via SUBCUTANEOUS
  Filled 2024-02-18: qty 0.09

## 2024-02-18 MED ORDER — INSULIN ASPART 100 UNIT/ML IJ SOLN
0.0000 [IU] | Freq: Every day | INTRAMUSCULAR | Status: DC
  Administered 2024-02-18: 2 [IU] via SUBCUTANEOUS
  Administered 2024-02-19: 4 [IU] via SUBCUTANEOUS
  Administered 2024-02-20: 3 [IU] via SUBCUTANEOUS
  Administered 2024-02-21: 5 [IU] via SUBCUTANEOUS
  Filled 2024-02-18: qty 0.05

## 2024-02-18 MED ORDER — SODIUM CHLORIDE 0.9 % IV SOLN: INTRAVENOUS | Status: AC

## 2024-02-18 MED ORDER — ACETAMINOPHEN 10 MG/ML IV SOLN
1000.0000 mg | Freq: Once | INTRAVENOUS | Status: AC
  Administered 2024-02-18: 1000 mg via INTRAVENOUS
  Filled 2024-02-18: qty 100

## 2024-02-18 MED ORDER — SODIUM CHLORIDE 0.9 % IV SOLN
1.0000 g | INTRAVENOUS | Status: DC
  Administered 2024-02-19: 1 g via INTRAVENOUS
  Filled 2024-02-18: qty 10

## 2024-02-18 MED ORDER — POLYETHYLENE GLYCOL 3350 17 G PO PACK: 17.0000 g | PACK | Freq: Every day | ORAL | Status: DC | PRN

## 2024-02-18 MED ORDER — INSULIN GLARGINE-YFGN 100 UNIT/ML ~~LOC~~ SOLN
10.0000 [IU] | Freq: Every day | SUBCUTANEOUS | Status: DC
  Administered 2024-02-18 – 2024-02-19 (×2): 10 [IU] via SUBCUTANEOUS
  Filled 2024-02-18 (×2): qty 0.1

## 2024-02-18 MED ORDER — SODIUM CHLORIDE 0.9 % IV BOLUS
1000.0000 mL | Freq: Once | INTRAVENOUS | Status: AC
  Administered 2024-02-18: 1000 mL via INTRAVENOUS

## 2024-02-18 MED ORDER — LACOSAMIDE 50 MG PO TABS
50.0000 mg | ORAL_TABLET | Freq: Two times a day (BID) | ORAL | Status: DC
  Administered 2024-02-19: 50 mg via ORAL
  Filled 2024-02-18 (×2): qty 1

## 2024-02-18 MED ORDER — SODIUM CHLORIDE 0.9 % IV SOLN
50.0000 mg | INTRAVENOUS | Status: AC
  Administered 2024-02-18: 50 mg via INTRAVENOUS
  Filled 2024-02-18: qty 5

## 2024-02-18 MED ORDER — BISACODYL 5 MG PO TBEC: 5.0000 mg | DELAYED_RELEASE_TABLET | Freq: Every day | ORAL | Status: DC | PRN

## 2024-02-18 MED ORDER — SODIUM CHLORIDE 0.9 % IV SOLN
1.0000 g | Freq: Once | INTRAVENOUS | Status: AC
  Administered 2024-02-18: 1 g via INTRAVENOUS
  Filled 2024-02-18: qty 10

## 2024-02-18 NOTE — ED Notes (Signed)
 Dr. Ranelle Buys notified of patients temp of 102.7 and condition. JRPRN

## 2024-02-18 NOTE — ED Triage Notes (Signed)
 Patient BIB EMS from [facility] for witnessed seizure. Staff walked by patients room and patient was seizing. Patient has advance Dementia and Epilepsy. Staff at facility states patient had a seizure earlier in the week and they did not have her evaluated or levels checked. Patient will state her name but no other communication.   CBG 278 per EMS, normal for patient.  Temp 98.4 per EMS

## 2024-02-18 NOTE — H&P (Signed)
 [facility] History and Physical  [name] AVW:[id number] DOB: 03-27-1946 DOA: 02/18/2024 PCP: Patient, No Pcp Per  [facility]ed from: [facility] Chief Complaint: Altered mental status, seizure  History of Present Illness: Yvette Gutierrez is a 78 y.o. female with PMH significant for dementia, bedbound status, DM2, HTN, HLD, CVA, depression. Patient was sent to the ED from the nursing facility for seizure and lethargy.  About a week ago, patient had a seizure-like activity which self resolved and has not worked up.  Today, she was noted to have generalized tonic-clonic at the facility.  She was noted to be lethargic and sent to the ED. No mention of fever at the facility.  Patient unable to verbalize any urinary symptoms.  In the ED, patient had a fever of 102.7, heart rate 110, hemodynamically stable, breathing on room air Labs showed WC count 6.9, hemoglobin 9.5, platelet 192, glucose level elevated to 78, serum bicarb low at 15, BUN/creatinine 39/1.6, lactic acid initially 3.7 and later down to 1.7 Respiratory virus panel unremarkable Urinalysis showed turbid yellow urine with small hemoglobin, large leukocytes, negative nitrite and many bacteria Urine culture and blood culture were sent Patient was started on IV Rocephin and Lamictal. Hospitalist service was consulted for inpatient management  At the time of my evaluation, patient was somnolent.  Open eyes on command and only able to mumble a word, fell back to sleep. Sons were at bedside.  History reviewed with them. Patient has been a long-term resident at [facility] for last 10 years.  She has progressively declined physically and mentally over the years.  For the last several months, she is essentially bed/wheelchair-bound and has advanced dementia.  They deny any prior history of seizure.  They are not sure why she is on Lamictal.  However I reviewed her discharge summary from one of her hospitalizations and  2017.  She was on Lamictal at that time as well. DNR/DNI status confirmed  Review of Systems:  All systems were reviewed and were negative unless otherwise mentioned in the HPI   Past medical history: Past Medical History:  Diagnosis Date   Allergic rhinitis    Bilateral shoulder pain    chronic-uses vicodin a few times a month for pain control   CVA (cerebral infarction) 2002   Chronic unsteadiness   Dementia (HCC)    Diabetes mellitus type 2 with complications (HCC)    Femur fracture, left (HCC) 09/2014   nonsurgical treatment, immobilized   HTN (hypertension)    Hyperlipidemia    Major depressive disorder, recurrent episode, mild (HCC)    Neuropathy due to secondary diabetes (HCC)    Obesity    Seizure disorder (HCC)    Slow transit constipation    Vitamin D  deficiency     Past surgical history: Past Surgical History:  Procedure Laterality Date   FEMUR IM NAIL Left 06/16/2021   Procedure: INTRAMEDULLARY (IM) NAIL FEMORAL;  Surgeon: Osa Blase, MD;  Location: [facility];  Service: Orthopedics;  Laterality: Left;   NSVD  [phone]   ventral hernia repair  1999    Social History:  reports that she has never smoked. She has never used smokeless tobacco. She reports that she does not drink alcohol and does not use drugs.  Allergies:  No Known Allergies Patient has no known allergies.   Family history:  Family History  Problem Relation Age of Onset   Heart attack Father 60   Stroke Father 21   Coronary artery  disease Mother    Diabetes Mother    Hypertension Mother    Stroke Mother    Dementia Mother    Diabetes Sister    Sudden death Maternal Uncle    Colon cancer Neg Hx      Physical Exam: Vitals:   02/18/24 2018 02/18/24 2030 02/18/24 2045 02/18/24 2100  BP:  109/62 (!) 122/57 (!) 115/59  Pulse:  91 87 90  Resp:  (!) 24  (!) 22  Temp: 98.6 F (37 C)     TempSrc: Oral     SpO2:  92% 97% 95%   Wt Readings from Last 3 Encounters:  06/15/21  83.5 kg  02/23/17 83.5 kg  01/26/17 83.5 kg   There is no height or weight on file to calculate BMI.  General exam: Pleasant, elderly Caucasian female. Skin: No rashes, lesions or ulcers. HEENT: Atraumatic, normocephalic, no obvious bleeding Lungs: Clear to auscultation bilaterally,  CVS: S1, S2, no murmur,   GI/Abd: Soft, nontender, nondistended, bowel sound present,   CNS: Opens eyes on command, falls right back to sleep.  Advanced dementia at baseline Psychiatry: Sad affect Extremities: No pedal edema, no calf tenderness,    ----------------------------------------------------------------------------------------------------------------------------------------- ----------------------------------------------------------------------------------------------------------------------------------------- -----------------------------------------------------------------------------------------------------------------------------------------  Assessment/Plan: Principal Problem:   Seizure (HCC)  Sepsis POA UTI Presented with seizure, lethargy Noted to have fever, tachycardia Urinalysis with stable infection Urine culture and blood culture sent Started on IV Rocephin.  Continue the same Recent Labs  Lab 02/18/24 1710 02/18/24 1727 02/18/24 1922  WBC 6.9  --   --   LATICACIDVEN  --  3.7* 1.7   Breakthrough seizures H/o seizure disorder on Vimpat  Reportedly had 1 episode of seizure last week.  Sent to the ED after another 1 today. Seizure likely precipitated by UTI Lamictal to continue Patient's sons are interested in obtaining an EEG.  Ordered  Lactic acidosis Lactic acid level was elevated to 3.7.  Could be a combination of sepsis and seizure.  Level normalized.  Acute metabolic encephalopathy Advanced dementia Depression Mental status poor at baseline due to dementia.  Further altered currently because of UTI and seizure.   Continue to monitor.  Resume Effexor  when more  awake Dysphagia 2 diet when awake  Type 2 diabetes mellitus A1c 11 in 2022.  Update A1c PTA meds-Levemir , NovoLog , metformin  Blood sugar level elevated at 278. Start Semglee  10 units tonight. SSI/Accu-Cheks Lab Results  Component Value Date   HGBA1C 11.0 (H) 06/17/2021   No results for input(s): "GLUCAP" in the last 168 hours.  Hypertension PTA meds- Coreg , lisinopril , nifedipine  Hydration overnight.  Resume blood pressure medicine when hemodynamically stabilizes  CVA/HLD Unsure if she is still on Aggrenox  and simvastatin .  Home med list not finalized yet  Chronic anemia Continue iron supplement Recent Labs    02/18/24 1710  HGB 9.5*  MCV 89.2      Home med list has not been obtained/updated by [facility] at the time of admission.  Please double check admission reconciliation.     PMH significant for dementia, bedbound status, DM2, HTN, HLD, CVA, depression. Patient was sent to the ED from the nursing facility for seizure and lethargy.  About a week ago, patient had a seizure-like activity which self resolved and has not worked up.  Today, she was noted to have generalized tonic-clonic at the facility.  She was noted to be lethargic and sent to the ED. No mention of fever at the facility.  Patient unable to verbalize any urinary symptoms.  In the  ED, patient had a fever of 102.7, heart rate 110, hemodynamically stable, breathing on room air Labs showed WC count 6.9, hemoglobin 9.5, platelet 192, glucose level elevated to 78, serum bicarb low at 15, BUN/creatinine 39/1.6, lactic acid initially 3.7 and later down to 1.7 Respiratory virus panel unremarkable Urinalysis showed turbid yellow urine with small hemoglobin, large leukocytes, negative nitrite and many bacteria Urine culture and blood culture were sent Patient was started on IV Rocephin and Lamictal. Hospitalist service was consulted for inpatient management  At the time of my evaluation, patient was somnolent.   Open eyes on command and only able to mumble a word, fell back to sleep. Sons were at bedside.  History reviewed with them. Patient has been a long-term resident at [facility] for last 10 years.  She has progressively declined physically and mentally over the years.  For the last several months, she is essentially bed/wheelchair-bound and has advanced dementia.  They deny any prior history of seizure.  They are not sure why she is on Lamictal.  However I reviewed her discharge summary from one of her hospitalizations and 2017.  She was on Lamictal at that time as well. DNR/DNI status confirmed   Mobility: Bed/wheelchair bound  Goals of care:   Code Status: Limited: Do not attempt resuscitation (DNR) -DNR-LIMITED -Do Not Intubate/DNI     DVT prophylaxis:  enoxaparin  (LOVENOX ) injection 40 mg Start: 02/18/24 2200   Antimicrobials: None Fluid: NS at 75 mL/h Consultants: None Family Communication: Sons at bedside  Status: Inpatient Level of care:  Progressive   Patient is from: [facility] long-term nursing home Anticipated d/c to: Hopefully back to the same in the next 1 to 2 days  Diet:  Diet Order             DIET DYS 2 Room service appropriate? Yes; Fluid consistency: Thin  Diet effective now                    ------------------------------------------------------------------------------------- Severity of Illness: The appropriate patient status for this patient is INPATIENT. Inpatient status is judged to be reasonable and necessary in order to provide the required intensity of service to ensure the patient's safety. The patient's presenting symptoms, physical exam findings, and initial radiographic and laboratory data in the context of their chronic comorbidities is felt to place them at high risk for further clinical deterioration. Furthermore, it is not anticipated that the patient will be medically stable for discharge from the hospital within 2 midnights of  admission.   * I certify that at the point of admission it is my clinical judgment that the patient will require inpatient hospital care spanning beyond 2 midnights from the point of admission due to high intensity of service, high risk for further deterioration and high frequency of surveillance required.* -------------------------------------------------------------------------------------   Home Meds: Prior to Admission medications   Medication Sig Start Date End Date Taking? Authorizing Provider  b complex vitamins capsule Take 1 capsule by mouth daily.    [provider]  bisacodyl  (DULCOLAX) 10 MG suppository Place 1 suppository (10 mg total) rectally daily as needed for moderate constipation. 06/18/21   Hoyt Macleod, MD  carvedilol  (COREG ) 12.5 MG tablet Take 1 tablet (12.5 mg total) by mouth 2 (two) times daily with a meal. 06/18/21   Kizer Nobbe, Aminta Baldy, MD  dipyridamole -aspirin  (AGGRENOX ) 200-25 MG 12hr capsule Take 1 capsule by mouth 2 (two) times daily. 07/17/21   Hoyt Macleod, MD  enoxaparin  (LOVENOX ) 40 MG/0.4ML  injection Inject 0.4 mLs (40 mg total) into the skin daily for 30 doses. For 30 days post op for DVT prophylaxis 06/18/21 07/18/21  Brown, Blaine K, PA-C  ferrous sulfate  325 (65 FE) MG tablet Take 1 tablet (325 mg total) by mouth 3 (three) times daily after meals. 06/18/21   Hoyt Macleod, MD  lacosamide  (VIMPAT ) 50 MG TABS tablet Take one tablet by mouth every 12 hours 02/18/16   Reed, Tiffany L, DO  LEVEMIR  100 UNIT/ML injection Inject 0.3 mLs (30 Units total) into the skin at bedtime. 06/18/21   Hoyt Macleod, MD  Lidocaine  4 % PTCH Apply 1 patch topically daily.    [provider]  lisinopril  (ZESTRIL ) 2.5 MG tablet Take 2.5 mg by mouth daily. 06/05/21   [provider]  Menthol -Zinc Oxide (CALMOSEPTINE) 0.44-20.6 % OINT Apply 1 application topically in the morning and at bedtime.    [provider]  metFORMIN  (GLUCOPHAGE -XR) 500 MG 24 hr tablet Take  1,000 mg by mouth 2 (two) times daily. 06/07/21   [provider]  NIFEdipine  (PROCARDIA -XL/NIFEDICAL-XL) 30 MG 24 hr tablet Take 30 mg by mouth daily. 05/24/21   [provider]  NOVOLOG  100 UNIT/ML injection Inject 0-12 Units into the skin as directed. If BS<70, CALL NP/PA. If BS is 70-200=0 units, 201-250=2 units, 251-300=4 units, 301-350=6 units, 351-400=8 units, 401-450=10 units, 451-600=12 units, IF CBG>450 GIVE THE 12 UNITS, RECHECK IN 2 HRS, IF STILL>350 NOTIFY PROVIDER 06/11/21   [provider]  sennosides-docusate sodium  (SENOKOT-S) 8.6-50 MG tablet Take 2 tablets by mouth at bedtime.     [provider]  simvastatin  (ZOCOR ) 10 MG tablet Take 10 mg by mouth at bedtime. 05/24/21   [provider]  venlafaxine  XR (EFFEXOR -XR) 37.5 MG 24 hr capsule Take 37.5 mg by mouth daily with breakfast.    [provider]    Labs on Admission:   CBC: Recent Labs  Lab 02/18/24 1710  WBC 6.9  NEUTROABS 6.3  HGB 9.5*  HCT 32.1*  MCV 89.2  PLT 192    Basic Metabolic Panel: Recent Labs  Lab 02/18/24 1710  NA 136  K 4.0  CL 105  CO2 15*  GLUCOSE 278*  BUN 39*  CREATININE 1.60*  CALCIUM 9.3    Liver Function Tests: Recent Labs  Lab 02/18/24 1710  AST 18  ALT 15  ALKPHOS 81  BILITOT 0.5  PROT 7.4  ALBUMIN 3.2*   No results for input(s): "LIPASE", "AMYLASE" in the last 168 hours. No results for input(s): "AMMONIA" in the last 168 hours.  Cardiac Enzymes: No results for input(s): "CKTOTAL", "CKMB", "CKMBINDEX", "TROPONINI" in the last 168 hours.  BNP (last 3 results) No results for input(s): "BNP" in the last 8760 hours.  ProBNP (last 3 results) No results for input(s): "PROBNP" in the last 8760 hours.  CBG: No results for input(s): "GLUCAP" in the last 168 hours.  Lipase     Component Value Date/Time   LIPASE 278 10/06/2014 2244     Urinalysis    Component Value Date/Time   COLORURINE YELLOW 02/18/2024 1750    APPEARANCEUR TURBID (A) 02/18/2024 1750   APPEARANCEUR Hazy 10/07/2014 0020   LABSPEC 1.017 02/18/2024 1750   LABSPEC 1.018 10/07/2014 0020   PHURINE 5.0 02/18/2024 1750   GLUCOSEU NEGATIVE 02/18/2024 1750   GLUCOSEU Negative 10/07/2014 0020   HGBUR SMALL (A) 02/18/2024 1750   BILIRUBINUR NEGATIVE 02/18/2024 1750   BILIRUBINUR Negative 10/07/2014 0020   KETONESUR 5 (A) 02/18/2024  1750   PROTEINUR 100 (A) 02/18/2024 1750   NITRITE NEGATIVE 02/18/2024 1750   LEUKOCYTESUR LARGE (A) 02/18/2024 1750   LEUKOCYTESUR Negative 10/07/2014 0020     Drugs of Abuse     Component Value Date/Time   LABOPIA NEGATIVE 08/28/2014 1004   COCAINSCRNUR NEGATIVE 08/28/2014 1004   LABBENZ NEGATIVE 08/28/2014 1004   AMPHETMU NEGATIVE 08/28/2014 1004   THCU NEGATIVE 08/28/2014 1004   LABBARB NEGATIVE 08/28/2014 1004      Radiological Exams on Admission: No results found.   Signed, Hoyt Macleod, MD [facility] 02/18/2024

## 2024-02-18 NOTE — ED Notes (Signed)
 Pt de-stating to 80-91% on room air while resting. Is on 2 L nasal cannula.

## 2024-02-18 NOTE — ED Notes (Signed)
 ED TO INPATIENT HANDOFF REPORT  ED Nurse Name and Phone #: Toy Freund [phone]  S Name/Age/Gender Yvette Gutierrez 78 y.o. female Room/Bed: WA23/WA23  Code Status   Code Status: Full Code  Home/SNF/Other Skilled nursing facility [facility]  Patient oriented to: self and place - dementia at baseline, unable to tell the year and does not know why she's in the hospital.  Is this baseline? Yes   Triage Complete: Triage complete  Chief Complaint Seizure [facility]) [R56.9]  Triage Note Patient BIB EMS from [facility] for witnessed seizure. Staff walked by patients room and patient was seizing. Patient has advance Dementia and Epilepsy. Staff at facility states patient had a seizure earlier in the week and they did not have her evaluated or levels checked. Patient will state her name but no other communication.   CBG 278 per EMS, normal for patient.  Temp 98.4 per EMS     Allergies No Known Allergies  Level of Care/Admitting Diagnosis ED Disposition     ED Disposition  Admit   Condition  --   Comment  Hospital Area: [facility] [[id number]]  Level of Care: Progressive [102]  Admit to Progressive based on following criteria: MULTISYSTEM THREATS such as stable sepsis, metabolic/electrolyte imbalance with or without encephalopathy that is responding to early treatment.  May admit patient to [facility] or [facility] if equivalent level of care is available:: Yes  Covid Evaluation: Asymptomatic - no recent exposure (last 10 days) testing not required  Diagnosis: Seizure [facility]) [[id number]]  Admitting Physician: DAHAL, BINAYA [[id number]]  Attending Physician: DAHAL, BINAYA [[id number]]  Certification:: I certify this patient will need inpatient services for at least 2 midnights  Expected Medical Readiness: 02/20/2024          B Medical/Surgery History Past Medical History:  Diagnosis Date   Allergic rhinitis    Bilateral shoulder pain     chronic-uses vicodin a few times a month for pain control   CVA (cerebral infarction) 2002   Chronic unsteadiness   Dementia (HCC)    Diabetes mellitus type 2 with complications (HCC)    Femur fracture, left (HCC) 09/2014   nonsurgical treatment, immobilized   HTN (hypertension)    Hyperlipidemia    Major depressive disorder, recurrent episode, mild (HCC)    Neuropathy due to secondary diabetes (HCC)    Obesity    Seizure disorder (HCC)    Slow transit constipation    Vitamin D  deficiency    Past Surgical History:  Procedure Laterality Date   FEMUR IM NAIL Left 06/16/2021   Procedure: INTRAMEDULLARY (IM) NAIL FEMORAL;  Surgeon: Osa Blase, MD;  Location: [facility];  Service: Orthopedics;  Laterality: Left;   NSVD  [phone]   ventral hernia repair  1999     A IV Location/Drains/Wounds Patient Lines/Drains/Airways Status     Active Line/Drains/Airways     Name Placement date Placement time Site Days   Peripheral IV 02/18/24 20 G 1" Left;Posterior Hand 02/18/24  1713  Hand  less than 1   Peripheral IV 02/18/24 20 G 1" Left;Posterior Forearm 02/18/24  1713  Forearm  less than 1   Incision (Closed) 06/16/21 Hip Left 06/16/21  1141  -- 977            Intake/Output Last 24 hours No intake or output data in the 24 hours ending 02/18/24 2055  Labs/Imaging Results for orders placed or performed during the hospital encounter of 02/18/24 (from the  past 48 hours)  Comprehensive metabolic panel     Status: Abnormal   Collection Time: 02/18/24  5:10 PM  Result Value Ref Range   Sodium 136 135 - 145 mmol/L   Potassium 4.0 3.5 - 5.1 mmol/L   Chloride 105 98 - 111 mmol/L   CO2 15 (L) 22 - 32 mmol/L   Glucose, Bld 278 (H) 70 - 99 mg/dL    Comment: Glucose reference range applies only to samples taken after fasting for at least 8 hours.   BUN 39 (H) 8 - 23 mg/dL   Creatinine, Ser 1.30 (H) 0.44 - 1.00 mg/dL   Calcium 9.3 8.9 - 86.5 mg/dL   Total Protein 7.4 6.5 - 8.1  g/dL   Albumin 3.2 (L) 3.5 - 5.0 g/dL   AST 18 15 - 41 U/L   ALT 15 0 - 44 U/L   Alkaline Phosphatase 81 38 - 126 U/L   Total Bilirubin 0.5 0.0 - 1.2 mg/dL   GFR, Estimated 33 (L) >60 mL/min    Comment: (NOTE) Calculated using the CKD-EPI Creatinine Equation (2021)    Anion gap 16 (H) 5 - 15    Comment: Performed at [facility], [address], [place], [place] [address]  CBC with Differential     Status: Abnormal   Collection Time: 02/18/24  5:10 PM  Result Value Ref Range   WBC 6.9 4.0 - 10.5 K/uL   RBC 3.60 (L) 3.87 - 5.11 MIL/uL   Hemoglobin 9.5 (L) 12.0 - 15.0 g/dL   HCT 62.9 (L) 52.8 - 41.3 %   MCV 89.2 80.0 - 100.0 fL   MCH 26.4 26.0 - 34.0 pg   MCHC 29.6 (L) 30.0 - 36.0 g/dL   RDW 24.4 01.0 - 27.2 %   Platelets 192 150 - 400 K/uL   nRBC 0.0 0.0 - 0.2 %   Neutrophils Relative % 92 %   Neutro Abs 6.3 1.7 - 7.7 K/uL   Lymphocytes Relative 3 %   Lymphs Abs 0.2 (L) 0.7 - 4.0 K/uL   Monocytes Relative 4 %   Monocytes Absolute 0.3 0.1 - 1.0 K/uL   Eosinophils Relative 0 %   Eosinophils Absolute 0.0 0.0 - 0.5 K/uL   Basophils Relative 0 %   Basophils Absolute 0.0 0.0 - 0.1 K/uL   Immature Granulocytes 1 %   Abs Immature Granulocytes 0.04 0.00 - 0.07 K/uL    Comment: Performed at [facility], [address], [place], [place] [address]  Culture, blood (routine x 2)     Status: None (Preliminary result)   Collection Time: 02/18/24  5:10 PM   Specimen: BLOOD LEFT ARM  Result Value Ref Range   Specimen Description      BLOOD LEFT ARM Performed at [facility], [address], [place], [place] [address]    Special Requests      BOTTLES DRAWN AEROBIC AND ANAEROBIC Blood Culture results may not be optimal due to an inadequate volume of blood received in culture bottles Performed at [facility], [address], [place], [place] [address]    Culture PENDING    Report Status PENDING   Culture, blood (routine x  2)     Status: None (Preliminary result)   Collection Time: 02/18/24  5:15 PM   Specimen: BLOOD LEFT HAND  Result Value Ref Range   Specimen Description      BLOOD LEFT HAND Performed at [facility],  [address] [place], [place] [address]    Special Requests      BOTTLES DRAWN AEROBIC AND ANAEROBIC Blood Culture results may not be optimal due to an inadequate volume of blood received in culture bottles Performed at [facility], [address] [place], [place] [address]    Culture PENDING    Report Status PENDING   I-Stat Lactic Acid     Status: Abnormal   Collection Time: 02/18/24  5:27 PM  Result Value Ref Range   Lactic Acid, Venous 3.7 (HH) 0.5 - 1.9 mmol/L   Comment NOTIFIED PHYSICIAN   Resp panel by RT-PCR (RSV, Flu A&B, Covid) Anterior Nasal Swab     Status: None   Collection Time: 02/18/24  5:37 PM   Specimen: Anterior Nasal Swab  Result Value Ref Range   SARS Coronavirus 2 by RT PCR NEGATIVE NEGATIVE    Comment: (NOTE) SARS-CoV-2 target nucleic acids are NOT DETECTED.  The SARS-CoV-2 RNA is generally detectable in upper respiratory specimens during the acute phase of infection. The lowest concentration of SARS-CoV-2 viral copies this assay can detect is 138 copies/mL. A negative result does not preclude SARS-Cov-2 infection and should not be used as the sole basis for treatment or other patient management decisions. A negative result may occur with  improper specimen collection/handling, submission of specimen other than nasopharyngeal swab, presence of viral mutation(s) within the areas targeted by this assay, and inadequate number of viral copies(<138 copies/mL). A negative result must be combined with clinical observations, patient history, and epidemiological information. The expected result is Negative.  Fact Sheet for Patients:  BloggerCourse.com  Fact Sheet for Healthcare Providers:   SeriousBroker.it  This test is no t yet approved or cleared by the [place]  FDA and  has been authorized for detection and/or diagnosis of SARS-CoV-2 by FDA under an Emergency Use Authorization (EUA). This EUA will remain  in effect (meaning this test can be used) for the duration of the COVID-19 declaration under Section 564(b)(1) of the Act, 21 U.S.C.section 360bbb-3(b)(1), unless the authorization is terminated  or revoked sooner.       Influenza A by PCR NEGATIVE NEGATIVE   Influenza B by PCR NEGATIVE NEGATIVE    Comment: (NOTE) The Xpert Xpress SARS-CoV-2/FLU/RSV plus assay is intended as an aid in the diagnosis of influenza from Nasopharyngeal swab specimens and should not be used as a sole basis for treatment. Nasal washings and aspirates are unacceptable for Xpert Xpress SARS-CoV-2/FLU/RSV testing.  Fact Sheet for Patients: BloggerCourse.com  Fact Sheet for Healthcare Providers: SeriousBroker.it  This test is not yet approved or cleared by the [place]  FDA and has been authorized for detection and/or diagnosis of SARS-CoV-2 by FDA under an Emergency Use Authorization (EUA). This EUA will remain in effect (meaning this test can be used) for the duration of the COVID-19 declaration under Section 564(b)(1) of the Act, 21 U.S.C. section 360bbb-3(b)(1), unless the authorization is terminated or revoked.     Resp Syncytial Virus by PCR NEGATIVE NEGATIVE    Comment: (NOTE) Fact Sheet for Patients: BloggerCourse.com  Fact Sheet for Healthcare Providers: SeriousBroker.it  This test is not yet approved or cleared by the [place]  FDA and has been authorized for detection and/or diagnosis of SARS-CoV-2 by FDA under an Emergency Use Authorization (EUA). This EUA will remain in effect (meaning this test can be used) for the duration of  the COVID-19 declaration under Section 564(b)(1) of the Act, 21 U.S.C. section 360bbb-3(b)(1), unless  the authorization is terminated or revoked.  Performed at [facility], [address] [place], [place] [address]   Urinalysis, w/ Reflex to Culture (Infection Suspected) -Urine, Catheterized     Status: Abnormal   Collection Time: 02/18/24  5:50 PM  Result Value Ref Range   Specimen Source URINE, CATHETERIZED    Color, Urine YELLOW YELLOW   APPearance TURBID (A) CLEAR   Specific Gravity, Urine 1.017 1.005 - 1.030   pH 5.0 5.0 - 8.0   Glucose, UA NEGATIVE NEGATIVE mg/dL   Hgb urine dipstick SMALL (A) NEGATIVE   Bilirubin Urine NEGATIVE NEGATIVE   Ketones, ur 5 (A) NEGATIVE mg/dL   Protein, ur 604 (A) NEGATIVE mg/dL   Nitrite NEGATIVE NEGATIVE   Leukocytes,Ua LARGE (A) NEGATIVE   RBC / HPF 21-50 0 - 5 RBC/hpf   WBC, UA >50 0 - 5 WBC/hpf    Comment:        Reflex urine culture not performed if WBC <=10, OR if Squamous epithelial cells >5. If Squamous epithelial cells >5 suggest recollection.    Bacteria, UA MANY (A) NONE SEEN   Squamous Epithelial / HPF 0-5 0 - 5 /HPF   Mucus PRESENT     Comment: Performed at [facility], [address], [place], [place] [address]  I-Stat Lactic Acid     Status: None   Collection Time: 02/18/24  7:22 PM  Result Value Ref Range   Lactic Acid, Venous 1.7 0.5 - 1.9 mmol/L   No results found.  Pending Labs Unresulted Labs (From admission, onward)     Start     Ordered   02/19/24 0500  Basic metabolic panel  Tomorrow morning,   R        02/18/24 2002   02/19/24 0500  CBC  Tomorrow morning,   R        02/18/24 2002   02/18/24 2001  Hemoglobin A1c  Add-on,   AD       Comments: To assess prior glycemic control    02/18/24 2002   02/18/24 1750  Urine Culture  Once,   R        02/18/24 1750   02/18/24 1709  Lacosamide   Once,   URGENT        02/18/24 1710            Vitals/Pain Today's  Vitals   02/18/24 1930 02/18/24 1945 02/18/24 2018 02/18/24 2030  BP: 111/60 (!) 117/56  109/62  Pulse: 95 93  91  Resp: 20   (!) 24  Temp:   98.6 F (37 C)   TempSrc:   Oral   SpO2: 95% 96%  92%    Isolation Precautions No active isolations  Medications Medications  cefTRIAXone (ROCEPHIN) 1 g in sodium chloride  0.9 % 100 mL IVPB (has no administration in time range)  insulin  aspart (novoLOG ) injection 0-9 Units (has no administration in time range)  insulin  aspart (novoLOG ) injection 0-5 Units (has no administration in time range)  acetaminophen  (TYLENOL ) tablet 650 mg (has no administration in time range)    Or  acetaminophen  (TYLENOL ) suppository 650 mg (has no administration in time range)  polyethylene glycol (MIRALAX  / GLYCOLAX ) packet 17 g (has no administration in time range)  bisacodyl  (DULCOLAX) EC tablet 5 mg (has no administration in time range)  albuterol (PROVENTIL) (2.5 MG/3ML) 0.083% nebulizer solution 2.5 mg (has no administration in time range)  hydrALAZINE (APRESOLINE) injection 10 mg (has no administration in  time range)  enoxaparin  (LOVENOX ) injection 40 mg (has no administration in time range)  0.9 %  sodium chloride  infusion (has no administration in time range)  lacosamide  (VIMPAT ) 50 mg in sodium chloride  0.9 % 25 mL IVPB (0 mg Intravenous Stopped 02/18/24 1926)  sodium chloride  0.9 % bolus 1,000 mL (0 mLs Intravenous Stopped 02/18/24 1926)  cefTRIAXone (ROCEPHIN) 1 g in sodium chloride  0.9 % 100 mL IVPB (0 g Intravenous Stopped 02/18/24 1927)  acetaminophen  (OFIRMEV ) IV 1,000 mg (0 mg Intravenous Stopped 02/18/24 1957)  sodium chloride  0.9 % bolus 1,000 mL (1,000 mLs Intravenous New Bag/Given 02/18/24 1946)    Mobility non-ambulatory     Focused Assessments Cardiac Assessment Handoff:  Cardiac Rhythm: Normal sinus rhythm Lab Results  Component Value Date   CKTOTAL 74 08/28/2014   CKMB 2.7 08/28/2014   TROPONINI < 0.02 10/06/2014   No results found for:  "DDIMER" Does the Patient currently have chest pain? No   , Pulmonary Assessment Handoff:  Lung sounds:   O2 Device: Room Air      R Recommendations: See Admitting Provider Note  Report given to:   Additional Notes: -   Temp 102.7 improved to 98.6 oral after IV tylenol .  Had second liter of IV going now.

## 2024-02-18 NOTE — ED Provider Notes (Signed)
 [place] EMERGENCY DEPARTMENT AT [facility] Provider Note   CSN: [id number] Arrival date & time: 02/18/24  1638     History  No chief complaint on file.   Yvette Gutierrez is a 78 y.o. female.  With a history of seizure disorder on lacosamide , CVA, type 2 diabetes and dementia presents to ED after seizure.  Patient was noted to exhibit generalized tonic-clonic seizure activity at her nursing facility earlier today.  This resolved after a minute or 2 with no repeated episodes.  1 seizure episode about a week ago and she was not evaluated after that.  No associated trauma today.  Nursing staff did voiced concern for potential aspiration during seizure but she has been stable on room air since then.  Patient is awake alert and following commands but unable to contribute additional history at this time secondary to dementia  HPI     Home Medications Prior to Admission medications   Medication Sig Start Date End Date Taking? Authorizing Provider  b complex vitamins capsule Take 1 capsule by mouth daily.    [provider]  bisacodyl  (DULCOLAX) 10 MG suppository Place 1 suppository (10 mg total) rectally daily as needed for moderate constipation. 06/18/21   Hoyt Macleod, MD  carvedilol  (COREG ) 12.5 MG tablet Take 1 tablet (12.5 mg total) by mouth 2 (two) times daily with a meal. 06/18/21   Dahal, Aminta Baldy, MD  dipyridamole -aspirin  (AGGRENOX ) 200-25 MG 12hr capsule Take 1 capsule by mouth 2 (two) times daily. 07/17/21   Hoyt Macleod, MD  enoxaparin  (LOVENOX ) 40 MG/0.4ML injection Inject 0.4 mLs (40 mg total) into the skin daily for 30 doses. For 30 days post op for DVT prophylaxis 06/18/21 07/18/21  Brown, Blaine K, PA-C  ferrous sulfate  325 (65 FE) MG tablet Take 1 tablet (325 mg total) by mouth 3 (three) times daily after meals. 06/18/21   Hoyt Macleod, MD  lacosamide  (VIMPAT ) 50 MG TABS tablet Take one tablet by mouth every 12 hours 02/18/16   Reed, Tiffany L, DO  LEVEMIR  100 UNIT/ML  injection Inject 0.3 mLs (30 Units total) into the skin at bedtime. 06/18/21   Hoyt Macleod, MD  Lidocaine  4 % PTCH Apply 1 patch topically daily.    [provider]  lisinopril  (ZESTRIL ) 2.5 MG tablet Take 2.5 mg by mouth daily. 06/05/21   [provider]  Menthol -Zinc Oxide (CALMOSEPTINE) 0.44-20.6 % OINT Apply 1 application topically in the morning and at bedtime.    [provider]  metFORMIN  (GLUCOPHAGE -XR) 500 MG 24 hr tablet Take 1,000 mg by mouth 2 (two) times daily. 06/07/21   [provider]  NIFEdipine  (PROCARDIA -XL/NIFEDICAL-XL) 30 MG 24 hr tablet Take 30 mg by mouth daily. 05/24/21   [provider]  NOVOLOG  100 UNIT/ML injection Inject 0-12 Units into the skin as directed. If BS<70, CALL NP/PA. If BS is 70-200=0 units, 201-250=2 units, 251-300=4 units, 301-350=6 units, 351-400=8 units, 401-450=10 units, 451-600=12 units, IF CBG>450 GIVE THE 12 UNITS, RECHECK IN 2 HRS, IF STILL>350 NOTIFY PROVIDER 06/11/21   [provider]  sennosides-docusate sodium  (SENOKOT-S) 8.6-50 MG tablet Take 2 tablets by mouth at bedtime.     [provider]  simvastatin  (ZOCOR ) 10 MG tablet Take 10 mg by mouth at bedtime. 05/24/21   [provider]  venlafaxine  XR (EFFEXOR -XR) 37.5 MG 24 hr capsule Take 37.5 mg by mouth daily with breakfast.    [provider]      Allergies    Patient has  no known allergies.    Review of Systems   Review of Systems  Physical Exam Updated Vital Signs BP (!) 117/56   Pulse 93   Temp (!) 102.7 F (39.3 C) (Oral)   Resp 20   SpO2 96%  Physical Exam Vitals and nursing note reviewed.  HENT:     Head: Normocephalic and atraumatic.  Eyes:     Pupils: Pupils are equal, round, and reactive to light.  Cardiovascular:     Rate and Rhythm: Normal rate and regular rhythm.  Pulmonary:     Effort: Pulmonary effort is normal.     Breath sounds: Normal breath sounds.  Abdominal:     Palpations:  Abdomen is soft.     Tenderness: There is no abdominal tenderness.  Skin:    General: Skin is warm and dry.  Neurological:     Mental Status: She is alert.     Motor: No weakness.     Comments: Awakens to voice and able to follow commands, nonverbal  Psychiatric:        Mood and Affect: Mood normal.     ED Results / Procedures / Treatments   Labs (all labs ordered are listed, but only abnormal results are displayed) Labs Reviewed  COMPREHENSIVE METABOLIC PANEL WITH GFR - Abnormal; Notable for the following components:      Result Value   CO2 15 (*)    Glucose, Bld 278 (*)    BUN 39 (*)    Creatinine, Ser 1.60 (*)    Albumin 3.2 (*)    GFR, Estimated 33 (*)    Anion gap 16 (*)    All other components within normal limits  CBC WITH DIFFERENTIAL/PLATELET - Abnormal; Notable for the following components:   RBC 3.60 (*)    Hemoglobin 9.5 (*)    HCT 32.1 (*)    MCHC 29.6 (*)    Lymphs Abs 0.2 (*)    All other components within normal limits  URINALYSIS, W/ REFLEX TO CULTURE (INFECTION SUSPECTED) - Abnormal; Notable for the following components:   APPearance TURBID (*)    Hgb urine dipstick SMALL (*)    Ketones, ur 5 (*)    Protein, ur 100 (*)    Leukocytes,Ua LARGE (*)    Bacteria, UA MANY (*)    All other components within normal limits  I-STAT CG4 LACTIC ACID, ED - Abnormal; Notable for the following components:   Lactic Acid, Venous 3.7 (*)    All other components within normal limits  RESP PANEL BY RT-PCR (RSV, FLU A&B, COVID)  RVPGX2  CULTURE, BLOOD (ROUTINE X 2)  CULTURE, BLOOD (ROUTINE X 2)  URINE CULTURE  LACOSAMIDE   HEMOGLOBIN A1C  BASIC METABOLIC PANEL WITH GFR  CBC  I-STAT CG4 LACTIC ACID, ED    EKG EKG Interpretation Date/Time:  Thursday Feb 18 2024 16:51:32 EDT Ventricular Rate:  110 PR Interval:  156 QRS Duration:  137 QT Interval:  350 QTC Calculation: 474 R Axis:   -67  Text Interpretation: Sinus tachycardia Ventricular premature complex  RBBB and LAFB Confirmed by Rafael Bun [phone]) on 02/18/2024 6:21:09 PM  Radiology No results found.  Procedures Procedures    Medications Ordered in ED Medications  cefTRIAXone (ROCEPHIN) 1 g in sodium chloride  0.9 % 100 mL IVPB (has no administration in time range)  insulin  aspart (novoLOG ) injection 0-9 Units (has no administration in time range)  insulin  aspart (novoLOG ) injection 0-5 Units (has no administration in time range)  acetaminophen  (TYLENOL )  tablet 650 mg (has no administration in time range)    Or  acetaminophen  (TYLENOL ) suppository 650 mg (has no administration in time range)  polyethylene glycol (MIRALAX  / GLYCOLAX ) packet 17 g (has no administration in time range)  bisacodyl  (DULCOLAX) EC tablet 5 mg (has no administration in time range)  albuterol (PROVENTIL) (2.5 MG/3ML) 0.083% nebulizer solution 2.5 mg (has no administration in time range)  hydrALAZINE (APRESOLINE) injection 10 mg (has no administration in time range)  enoxaparin  (LOVENOX ) injection 40 mg (has no administration in time range)  0.9 %  sodium chloride  infusion (has no administration in time range)  lacosamide  (VIMPAT ) 50 mg in sodium chloride  0.9 % 25 mL IVPB (0 mg Intravenous Stopped 02/18/24 1926)  sodium chloride  0.9 % bolus 1,000 mL (0 mLs Intravenous Stopped 02/18/24 1926)  cefTRIAXone (ROCEPHIN) 1 g in sodium chloride  0.9 % 100 mL IVPB (0 g Intravenous Stopped 02/18/24 1927)  acetaminophen  (OFIRMEV ) IV 1,000 mg (0 mg Intravenous Stopped 02/18/24 1957)  sodium chloride  0.9 % bolus 1,000 mL (1,000 mLs Intravenous New Bag/Given 02/18/24 1946)    ED Course/ Medical Decision Making/ A&P Clinical Course as of 02/18/24 2003  Thu Feb 18, 2024  1731 Lactic of 3.7.  This may be in the setting of seizure earlier versus consistent sepsis. [MP]  1821 Laboratory workup notable for AKI.  EKG without evidence of dysrhythmia or ischemic changes.  Patient still febrile.  Concern for potential aspiration with p.o.  medications.  Will give IV Tylenol  [MP]  2002 Venous lactate appropriately downtrending.  Patient has remained hemodynamically stable.  Laboratory workup notable for UTI.  This may have been the triggering event for today seizure.  Gave ceftriaxone and another liter of IV fluids.  Discussed w admitting hospitalist excision for admission [MP]    Clinical Course User Index [MP] Sallyanne Creamer, DO                                 Medical Decision Making 78 year old female with history as above presenting from skilled nursing facility after seizure activity.  1 seizure episode today and another 1 week ago.  Is on lacosamide .  Vital signs notable for tachycardia and fever here.  Meeting SIRS criteria.  Will obtain infectious workup including cultures lactic labs UA and chest x-ray.  Will cover for suspected urinary source with ceftriaxone.  Will give her a dose of IV lacosamide  and initiate IV fluid bolus.  Admission likely.  Amount and/or Complexity of Data Reviewed Labs: ordered.  Risk Prescription drug management. Decision regarding hospitalization.           Final Clinical Impression(s) / ED Diagnoses Final diagnoses:  Seizure (HCC)  Acute cystitis without hematuria    Rx / DC Orders ED Discharge Orders     None         Sallyanne Creamer, DO 02/18/24 2003

## 2024-02-19 ENCOUNTER — Encounter (HOSPITAL_COMMUNITY): Payer: Self-pay | Admitting: Internal Medicine

## 2024-02-19 ENCOUNTER — Other Ambulatory Visit: Payer: Self-pay

## 2024-02-19 ENCOUNTER — Inpatient Hospital Stay (HOSPITAL_COMMUNITY)
Admit: 2024-02-19 | Discharge: 2024-02-19 | Disposition: A | Discharge status: Home / Self Care | Attending: Internal Medicine | Admitting: Internal Medicine

## 2024-02-19 DIAGNOSIS — Z515 Encounter for palliative care: Secondary | ICD-10-CM

## 2024-02-19 DIAGNOSIS — Z7189 Other specified counseling: Secondary | ICD-10-CM | POA: Diagnosis not present

## 2024-02-19 DIAGNOSIS — F0150 Vascular dementia without behavioral disturbance: Secondary | ICD-10-CM

## 2024-02-19 DIAGNOSIS — A419 Sepsis, unspecified organism: Secondary | ICD-10-CM | POA: Diagnosis not present

## 2024-02-19 DIAGNOSIS — G40909 Epilepsy, unspecified, not intractable, without status epilepticus: Secondary | ICD-10-CM | POA: Diagnosis not present

## 2024-02-19 DIAGNOSIS — Z7401 Bed confinement status: Secondary | ICD-10-CM

## 2024-02-19 DIAGNOSIS — N390 Urinary tract infection, site not specified: Secondary | ICD-10-CM | POA: Diagnosis not present

## 2024-02-19 DIAGNOSIS — Z8673 Personal history of transient ischemic attack (TIA), and cerebral infarction without residual deficits: Secondary | ICD-10-CM

## 2024-02-19 DIAGNOSIS — J9601 Acute respiratory failure with hypoxia: Secondary | ICD-10-CM

## 2024-02-19 DIAGNOSIS — F329 Major depressive disorder, single episode, unspecified: Secondary | ICD-10-CM

## 2024-02-19 DIAGNOSIS — R569 Unspecified convulsions: Secondary | ICD-10-CM | POA: Diagnosis not present

## 2024-02-19 LAB — BASIC METABOLIC PANEL WITH GFR
Anion gap: 11 (ref 5–15)
BUN: 34 mg/dL — ABNORMAL HIGH (ref 8–23)
CO2: 19 mmol/L — ABNORMAL LOW (ref 22–32)
Calcium: 8.5 mg/dL — ABNORMAL LOW (ref 8.9–10.3)
Chloride: 111 mmol/L (ref 98–111)
Creatinine, Ser: 1.42 mg/dL — ABNORMAL HIGH (ref 0.44–1.00)
GFR, Estimated: 38 mL/min — ABNORMAL LOW (ref >60)
Glucose, Bld: 154 mg/dL — ABNORMAL HIGH (ref 70–99)
Potassium: 3.8 mmol/L (ref 3.5–5.1)
Sodium: 141 mmol/L (ref 135–145)

## 2024-02-19 LAB — CBC
HCT: 32.4 % — ABNORMAL LOW (ref 36.0–46.0)
Hemoglobin: 8.8 g/dL — ABNORMAL LOW (ref 12.0–15.0)
MCH: 25.7 pg — ABNORMAL LOW (ref 26.0–34.0)
MCHC: 27.2 g/dL — ABNORMAL LOW (ref 30.0–36.0)
MCV: 94.5 fL (ref 80.0–100.0)
Platelets: 147 10*3/uL — ABNORMAL LOW (ref 150–400)
RBC: 3.43 MIL/uL — ABNORMAL LOW (ref 3.87–5.11)
RDW: 15.4 % (ref 11.5–15.5)
WBC: 6.4 10*3/uL (ref 4.0–10.5)
nRBC: 0 % (ref 0.0–0.2)

## 2024-02-19 LAB — GLUCOSE, CAPILLARY
Glucose-Capillary: 143 mg/dL — ABNORMAL HIGH (ref 70–99)
Glucose-Capillary: 215 mg/dL — ABNORMAL HIGH (ref 70–99)
Glucose-Capillary: 328 mg/dL — ABNORMAL HIGH (ref 70–99)
Glucose-Capillary: 346 mg/dL — ABNORMAL HIGH (ref 70–99)

## 2024-02-19 LAB — MRSA NEXT GEN BY PCR, NASAL: MRSA by PCR Next Gen: NOT DETECTED

## 2024-02-19 MED ORDER — NIFEDIPINE ER OSMOTIC RELEASE 30 MG PO TB24
30.0000 mg | ORAL_TABLET | Freq: Every day | ORAL | Status: DC
  Administered 2024-02-19: 30 mg via ORAL
  Filled 2024-02-19 (×5): qty 1

## 2024-02-19 MED ORDER — ENOXAPARIN SODIUM 30 MG/0.3ML IJ SOSY: 30.0000 mg | PREFILLED_SYRINGE | Freq: Every day | INTRAMUSCULAR | Status: DC

## 2024-02-19 MED ORDER — SIMVASTATIN 10 MG PO TABS
10.0000 mg | ORAL_TABLET | Freq: Every day | ORAL | Status: DC
  Filled 2024-02-19: qty 1

## 2024-02-19 MED ORDER — ASPIRIN-DIPYRIDAMOLE ER 25-200 MG PO CP12
1.0000 | ORAL_CAPSULE | Freq: Two times a day (BID) | ORAL | Status: DC
  Administered 2024-02-19: 1 via ORAL
  Filled 2024-02-19 (×8): qty 1

## 2024-02-19 MED ORDER — MEMANTINE HCL 5 MG PO TABS
10.0000 mg | ORAL_TABLET | Freq: Two times a day (BID) | ORAL | Status: DC
  Administered 2024-02-19 – 2024-02-21 (×2): 10 mg via ORAL
  Filled 2024-02-19: qty 1
  Filled 2024-02-19: qty 2
  Filled 2024-02-19: qty 1

## 2024-02-19 MED ORDER — ORAL CARE MOUTH RINSE: 15.0000 mL | OROMUCOSAL | Status: DC | PRN

## 2024-02-19 NOTE — TOC Initial Note (Signed)
 Transition of Care [facility]) - Initial/Assessment Note   Patient Details  Name: Yvette Gutierrez MRN: [id number] Date of Birth: 01-Aug-1946  Transition [facility]) CM/SW Contact:    Zenon Hilda, LCSW Phone Number: 02/19/2024, 10:03 AM  Clinical Narrative: Patient is a long-term resident from [facility]. Patient is expected to return at discharge. FL2 started. Patient is currently on IV antibiotics. TOC to follow.  Expected Discharge Plan: Long Term Nursing Home Barriers to Discharge: Continued Medical Work up  Patient Goals and CMS Choice Patient states their goals for this hospitalization and ongoing recovery are:: Will return to [facility] Choice offered to / list presented to : NA  Expected Discharge Plan and Services In-house Referral: Clinical Social Work Post Acute Care Choice: Nursing Home Living arrangements for the past 2 months: Skilled Nursing Facility           DME Arranged: N/A DME Agency: NA  Prior Living Arrangements/Services Living arrangements for the past 2 months: Skilled Nursing Facility Lives with:: Facility Resident Patient language and need for interpreter reviewed:: Yes Do you feel safe going back to the place where you live?: Yes      Need for Family Participation in Patient Care: Yes (Comment) (Patient is oriented to self only.) Care giver support system in place?: Yes (comment) Criminal Activity/Legal Involvement Pertinent to Current Situation/Hospitalization: No - Comment as needed  Emotional Assessment Orientation: : Oriented to Self Alcohol / Substance Use: Not Applicable Psych Involvement: No (comment)  Admission diagnosis:  Seizure (HCC) [R56.9] Acute cystitis without hematuria [N30.00] Patient Active Problem List   Diagnosis Date Noted   Bedbound 02/19/2024   Seizure (HCC) 02/18/2024   Femur fracture (HCC) 06/15/2021   History of cardioembolic cerebrovascular accident (CVA) 12/31/2016   Vascular dementia without behavioral  disturbance (HCC) 12/31/2016   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   CKD (chronic kidney disease) stage 3, GFR 30-59 ml/min (HCC) 08/26/2016   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 disease, with long-term current use of insulin  (HCC) 10/29/2015   Essential hypertension, benign 10/29/2015   DM type 2 with diabetic peripheral neuropathy (HCC) 02/14/2015   Major depressive disorder, recurrent episode, mild (HCC) 02/14/2015   Umbilical hernia without obstruction and without gangrene 02/14/2015   Primary osteoarthritis of both shoulders 02/14/2015   Slow transit constipation 02/14/2015   Seizure disorder (HCC) 02/14/2015   History of fall 2014-07-23   Death of family member 02/25/14   Rash and nonspecific skin eruption 05/04/2013   OSA (obstructive sleep apnea) 04/06/2013   Abnormality of gait 04/05/2013   Diabetic retinopathy (HCC) 06/22/2012   Hyperlipidemia LDL goal <70 04/02/2012   Cervical cancer screening 05/28/2011   Colon cancer screening 05/28/2011   Allergic rhinitis    Neuropathy due to secondary diabetes (HCC)    Cerebral infarction (HCC)    Bilateral shoulder pain    Backache 07/09/2010   Vitamin D  deficiency 10/31/2009   DEPENDENT EDEMA, LEGS, BILATERAL 10/31/2009   OBESITY, MORBID 05/14/2007   ONYCHOMYCOSIS 04/02/2007   DIABETES MELLITUS, TYPE II 03/31/2007   Major depression, chronic 03/31/2007   Essential hypertension 03/31/2007   PCP:  Patient, No Pcp Per Pharmacy:  No Pharmacies Listed  Social Drivers of Health (SDOH) Social History: SDOH Screenings   Food Insecurity: Patient Unable To Answer (02/19/2024)  Housing: Patient Unable To Answer (02/19/2024)  Transportation Needs: Patient Unable To Answer (02/19/2024)  Utilities: Patient Unable To Answer (02/19/2024)  Social Connections: Patient Unable  To Answer (02/19/2024)  Tobacco Use: Low Risk  (06/20/2021)   SDOH Interventions:    Readmission Risk Interventions     No data  to display

## 2024-02-19 NOTE — Plan of Care (Signed)
  Problem: Education: Goal: Ability to describe self-care measures that may prevent or decrease complications (Diabetes Survival Skills Education) will improve Outcome: Progressing Goal: Individualized Educational Video(s) Outcome: Progressing   Problem: Coping: Goal: Ability to adjust to condition or change in health will improve Outcome: Progressing   Problem: Fluid Volume: Goal: Ability to maintain a balanced intake and output will improve Outcome: Progressing   Problem: Health Behavior/Discharge Planning: Goal: Ability to identify and utilize available resources and services will improve Outcome: Progressing Goal: Ability to manage health-related needs will improve Outcome: Progressing   Problem: Metabolic: Goal: Ability to maintain appropriate glucose levels will improve Outcome: Progressing   Problem: Nutritional: Goal: Maintenance of adequate nutrition will improve Outcome: Progressing Goal: Progress toward achieving an optimal weight will improve Outcome: Progressing   Problem: Skin Integrity: Goal: Risk for impaired skin integrity will decrease Outcome: Progressing   Problem: Tissue Perfusion: Goal: Adequacy of tissue perfusion will improve Outcome: Progressing   Problem: Education: Goal: Knowledge of General Education information will improve Description: Including pain rating scale, medication(s)/side effects and non-pharmacologic comfort measures Outcome: Progressing   Problem: Health Behavior/Discharge Planning: Goal: Ability to manage health-related needs will improve Outcome: Progressing   Problem: Clinical Measurements: Goal: Ability to maintain clinical measurements within normal limits will improve Outcome: Progressing Goal: Will remain free from infection Outcome: Progressing Goal: Diagnostic test results will improve Outcome: Progressing Goal: Respiratory complications will improve Outcome: Progressing Goal: Cardiovascular complication will  be avoided Outcome: Progressing   Problem: Coping: Goal: Level of anxiety will decrease Outcome: Progressing   Problem: Elimination: Goal: Will not experience complications related to bowel motility Outcome: Progressing Goal: Will not experience complications related to urinary retention Outcome: Progressing   Problem: Pain Managment: Goal: General experience of comfort will improve and/or be controlled Outcome: Progressing   Problem: Safety: Goal: Ability to remain free from injury will improve Outcome: Progressing   Problem: Skin Integrity: Goal: Risk for impaired skin integrity will decrease Outcome: Progressing   Problem: Activity: Goal: Risk for activity intolerance will decrease Outcome: Not Progressing   Problem: Nutrition: Goal: Adequate nutrition will be maintained Outcome: Not Progressing

## 2024-02-19 NOTE — Procedures (Signed)
 Patient Name: Yvette Gutierrez  MRN: [id number]  Epilepsy Attending: Arleene Lack  Referring Physician/Provider: Hoyt Macleod, MD  Date: 02/19/2024 Duration: 23.14 mins  Patient history: 78yo F with seizure in setting of UTI. EEG to evaluate for seizure.   Level of alertness: Awake  AEDs during EEG study: LCM  Technical aspects: This EEG study was done with scalp electrodes positioned according to the 10-20 International system of electrode placement. Electrical activity was reviewed with band pass filter of 1-70Hz , sensitivity of 7 uV/mm, display speed of 31mm/sec with a 60Hz  notched filter applied as appropriate. EEG data were recorded continuously and digitally stored.  Video monitoring was available and reviewed as appropriate.  Description: The posterior dominant rhythm consists of 8 Hz activity of moderate voltage (25-35 uV) seen predominantly in posterior head regions, symmetric and reactive to eye opening and eye closing. Hyperventilation and photic stimulation were not performed.     IMPRESSION: This study is within normal limits. No seizures or epileptiform discharges were seen throughout the recording.  A normal interictal EEG does not exclude the diagnosis of epilepsy.  Najma Bozarth O Arnella Pralle

## 2024-02-19 NOTE — Progress Notes (Signed)
 EEG complete - results pending

## 2024-02-19 NOTE — Progress Notes (Signed)
 [facility] Progress Note  Patient: Yvette Gutierrez     WUJ:[id number]  DOA: 02/18/2024   PCP: Patient, No Pcp Per       Brief hospital course: This is a 78 year old female with dementia, diabetes, hypertension, history of CVA, hyperlipidemia, depression who is bedbound and resides in a nursing facility (for the past 10 years).  She sent to the hospital for seizure-like activity followed by lethargy.  She appeared to have a generalized/tonic-clonic seizure subsequently was lethargic and sent to the ED.  Per history, she had a seizure last week as well.  The patient takes Lamictal.  In the ED: Fever 102.7, heart rate 110, lactic acid 3.7 followed by 1.7. Serum bicarbonate 15 BUN 39 and creatinine 1.6. Further workup revealed UA showing large leukocytes and many bacteria with a small amount of hemoglobin and negative nitrite. Patient continued to be somnolent in the ED. DNR/DNI   Subjective:  Somnolent- will not answer questions right now.   Assessment and Plan: Principal Problem: Breakthrough seizure in the setting of UTI - Lacosamide  level in process - Urine culture and blood cultures pending as well - Continue ceftriaxone  and Vimpat  50 mg twice daily -  somnolent on my exam but took meds this AM  Active Problems:  Lactic acidosis - Lactic acid 3.7-has improved to 1.7 after IV fluids  AKI? Metabolic acidosis -Creatinine 9.56-OZHYQMVH to 1.42 - Last creatinine in 2022 was 0.84 and GFR was greater than 60 - Bicarb 15> 19  Normocytic anemia, thrombocytopenia - Hemoglobin was 9.5 and now is 8.8 -Plt 192> 147    Essential hypertension - Lisinopril  on hold for now - resume Nifedipine     DM type 2 with diabetic peripheral neuropathy, retinopathy - Janumet, NovoLog  sliding scale and Tresiba    History of cardioembolic cerebrovascular accident (CVA) - Aggrenox  and Zocor  resumed    Major depression Chronic, 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 disturbances - Namenda  resumed    Bedbound      Code Status: Limited: Do not attempt resuscitation (DNR) -DNR-LIMITED -Do Not Intubate/DNI  Total time on patient care: 35 min DVT prophylaxis:  Place and maintain sequential compression device Start: 02/19/24 1150     Objective:   Vitals:   02/18/24 2100 02/18/24 2215 02/19/24 0227 02/19/24 0543  BP: (!) 115/59 113/61 (!) 142/67 (!) 141/59  Pulse: 90 85 84 89  Resp: (!) 22 16 16 14   Temp:  98.4 F (36.9 C) 98.3 F (36.8 C) 98.4 F (36.9 C)  TempSrc:  Oral    SpO2: 95% 96% 99% 92%  Weight:  64.5 kg    Height:  5' (1.524 m)     Filed Weights   02/18/24 2215  Weight: 64.5 kg   Exam: General exam: Appears comfortable - somnolent HEENT: oral mucosa moist Respiratory system: Clear to auscultation.  Cardiovascular system: S1 & S2 heard  Gastrointestinal system: Abdomen soft, non-tender, nondistended. Normal bowel sounds   Extremities: No cyanosis, clubbing or edema Neuro: Only knows her name. Will open her mouth slightly on command but not following other commands.  Psychiatry:  pleasant, calm     CBC: Recent Labs  Lab 02/18/24 1710 02/19/24 0816  WBC 6.9 6.4  NEUTROABS 6.3  --   HGB 9.5* 8.8*  HCT 32.1* 32.4*  MCV 89.2 94.5  PLT 192 147*   Basic Metabolic Panel: Recent Labs  Lab 02/18/24 1710 02/19/24 0353  NA 136 141  K 4.0 3.8  CL 105 111  CO2 15* 19*  GLUCOSE 278* 154*  BUN 39* 34*  CREATININE 1.60* 1.42*  CALCIUM 9.3 8.5*     Scheduled Meds:  ferrous sulfate   325 mg Oral Q breakfast   insulin  aspart  0-5 Units Subcutaneous QHS   insulin  aspart  0-9 Units Subcutaneous TID WC   insulin  glargine-yfgn  10 Units Subcutaneous Q2200   lacosamide   50 mg Oral BID   senna-docusate  2 tablet Oral QHS    Imaging and lab data personally reviewed   Author: Trey Gulbranson  02/19/2024 11:50 AM  To contact [facility]>   Check the care team in [facility] and look for the attending/consulting [facility] provider listed  Log into  [email or web address] and use [facility] universal password   Go to> "[facility]"  and find provider  If you still have difficulty reaching the provider, please page the [facility] (Director on Call) for the Hospitalists listed on amion

## 2024-02-20 ENCOUNTER — Inpatient Hospital Stay (HOSPITAL_COMMUNITY)

## 2024-02-20 DIAGNOSIS — R569 Unspecified convulsions: Secondary | ICD-10-CM | POA: Diagnosis not present

## 2024-02-20 DIAGNOSIS — Z7401 Bed confinement status: Secondary | ICD-10-CM

## 2024-02-20 DIAGNOSIS — F0390 Unspecified dementia without behavioral disturbance: Secondary | ICD-10-CM

## 2024-02-20 DIAGNOSIS — A419 Sepsis, unspecified organism: Secondary | ICD-10-CM | POA: Diagnosis not present

## 2024-02-20 DIAGNOSIS — N390 Urinary tract infection, site not specified: Secondary | ICD-10-CM

## 2024-02-20 LAB — BASIC METABOLIC PANEL WITH GFR
Anion gap: 9 (ref 5–15)
Anion gap: 14 (ref 5–15)
BUN: 32 mg/dL — ABNORMAL HIGH (ref 8–23)
BUN: 31 mg/dL — ABNORMAL HIGH (ref 8–23)
CO2: 17 mmol/L — ABNORMAL LOW (ref 22–32)
CO2: 12 mmol/L — ABNORMAL LOW (ref 22–32)
Calcium: 8.8 mg/dL — ABNORMAL LOW (ref 8.9–10.3)
Calcium: 8 mg/dL — ABNORMAL LOW (ref 8.9–10.3)
Chloride: 112 mmol/L — ABNORMAL HIGH (ref 98–111)
Chloride: 113 mmol/L — ABNORMAL HIGH (ref 98–111)
Creatinine, Ser: 1.41 mg/dL — ABNORMAL HIGH (ref 0.44–1.00)
Creatinine, Ser: 1.63 mg/dL — ABNORMAL HIGH (ref 0.44–1.00)
GFR, Estimated: 38 mL/min — ABNORMAL LOW (ref >60)
GFR, Estimated: 32 mL/min — ABNORMAL LOW (ref >60)
Glucose, Bld: 398 mg/dL — ABNORMAL HIGH (ref 70–99)
Glucose, Bld: 298 mg/dL — ABNORMAL HIGH (ref 70–99)
Potassium: 3.9 mmol/L (ref 3.5–5.1)
Potassium: 3.6 mmol/L (ref 3.5–5.1)
Sodium: 138 mmol/L (ref 135–145)
Sodium: 139 mmol/L (ref 135–145)

## 2024-02-20 LAB — GLUCOSE, CAPILLARY
Glucose-Capillary: 334 mg/dL — ABNORMAL HIGH (ref 70–99)
Glucose-Capillary: 274 mg/dL — ABNORMAL HIGH (ref 70–99)
Glucose-Capillary: 258 mg/dL — ABNORMAL HIGH (ref 70–99)
Glucose-Capillary: 286 mg/dL — ABNORMAL HIGH (ref 70–99)

## 2024-02-20 LAB — MAGNESIUM: Magnesium: 1.4 mg/dL — ABNORMAL LOW (ref 1.7–2.4)

## 2024-02-20 MED ORDER — SODIUM CHLORIDE 0.9 % IV SOLN: 100.0000 mg | Freq: Two times a day (BID) | INTRAVENOUS | Status: DC

## 2024-02-20 MED ORDER — SODIUM CHLORIDE 0.9 % IV SOLN: INTRAVENOUS | Status: AC | PRN

## 2024-02-20 MED ORDER — MAGNESIUM SULFATE 2 GM/50ML IV SOLN
2.0000 g | Freq: Once | INTRAVENOUS | Status: AC
  Administered 2024-02-20: 2 g via INTRAVENOUS
  Filled 2024-02-20: qty 50

## 2024-02-20 MED ORDER — LACOSAMIDE 50 MG PO TABS: 100.0000 mg | ORAL_TABLET | Freq: Two times a day (BID) | ORAL | Status: DC

## 2024-02-20 MED ORDER — ORAL CARE MOUTH RINSE: 15.0000 mL | OROMUCOSAL | Status: DC | PRN

## 2024-02-20 MED ORDER — SODIUM CHLORIDE 0.9 % IV SOLN
2.0000 g | INTRAVENOUS | Status: DC
  Administered 2024-02-20: 2 g via INTRAVENOUS
  Filled 2024-02-20: qty 12.5

## 2024-02-20 MED ORDER — SODIUM CHLORIDE 0.9 % IV SOLN
50.0000 mg | Freq: Two times a day (BID) | INTRAVENOUS | Status: DC
  Filled 2024-02-20: qty 5

## 2024-02-20 MED ORDER — LEVETIRACETAM (KEPPRA) 500 MG/5 ML ADULT IV PUSH
1500.0000 mg | Freq: Once | INTRAVENOUS | Status: AC
  Administered 2024-02-20: 1500 mg via INTRAVENOUS
  Filled 2024-02-20: qty 15

## 2024-02-20 MED ORDER — LINEZOLID 600 MG/300ML IV SOLN
600.0000 mg | Freq: Two times a day (BID) | INTRAVENOUS | Status: DC
  Administered 2024-02-20 – 2024-02-21 (×4): 600 mg via INTRAVENOUS
  Filled 2024-02-20 (×5): qty 300

## 2024-02-20 MED ORDER — SODIUM CHLORIDE 0.9 % IV SOLN: INTRAVENOUS | Status: DC

## 2024-02-20 MED ORDER — LACOSAMIDE 50 MG PO TABS: 50.0000 mg | ORAL_TABLET | Freq: Two times a day (BID) | ORAL | Status: DC

## 2024-02-20 MED ORDER — SODIUM CHLORIDE 0.9 % IV BOLUS
500.0000 mL | Freq: Once | INTRAVENOUS | Status: AC
  Administered 2024-02-20: 500 mL via INTRAVENOUS

## 2024-02-20 MED ORDER — INSULIN GLARGINE-YFGN 100 UNIT/ML ~~LOC~~ SOLN
10.0000 [IU] | Freq: Two times a day (BID) | SUBCUTANEOUS | Status: DC
  Administered 2024-02-20 – 2024-02-21 (×3): 10 [IU] via SUBCUTANEOUS
  Filled 2024-02-20 (×4): qty 0.1

## 2024-02-20 MED ORDER — PIPERACILLIN-TAZOBACTAM 3.375 G IVPB
3.3750 g | Freq: Three times a day (TID) | INTRAVENOUS | Status: DC
  Administered 2024-02-20 – 2024-02-22 (×6): 3.375 g via INTRAVENOUS
  Filled 2024-02-20 (×6): qty 50

## 2024-02-20 MED ORDER — KETOROLAC TROMETHAMINE 15 MG/ML IJ SOLN
15.0000 mg | Freq: Once | INTRAMUSCULAR | Status: AC
  Administered 2024-02-20: 15 mg via INTRAVENOUS
  Filled 2024-02-20: qty 1

## 2024-02-20 MED ORDER — SODIUM CHLORIDE 0.9 % IV SOLN
100.0000 mg | Freq: Once | INTRAVENOUS | Status: AC
  Administered 2024-02-20: 100 mg via INTRAVENOUS
  Filled 2024-02-20: qty 10

## 2024-02-20 MED ORDER — SODIUM CHLORIDE 0.9 % IV SOLN
100.0000 mg | Freq: Two times a day (BID) | INTRAVENOUS | Status: DC
  Administered 2024-02-20 – 2024-02-24 (×9): 100 mg via INTRAVENOUS
  Filled 2024-02-20 (×11): qty 10

## 2024-02-20 MED ORDER — SODIUM BICARBONATE 8.4 % IV SOLN
INTRAVENOUS | Status: DC
  Filled 2024-02-20 (×2): qty 150
  Filled 2024-02-20: qty 1000
  Filled 2024-02-20 (×2): qty 150

## 2024-02-20 MED ORDER — LORAZEPAM 2 MG/ML IJ SOLN
1.0000 mg | INTRAMUSCULAR | Status: AC
  Administered 2024-02-20: 1 mg via INTRAVENOUS
  Filled 2024-02-20: qty 1

## 2024-02-20 MED ORDER — METOPROLOL TARTRATE 5 MG/5ML IV SOLN
2.5000 mg | Freq: Four times a day (QID) | INTRAVENOUS | Status: DC
  Administered 2024-02-21 – 2024-02-25 (×16): 2.5 mg via INTRAVENOUS
  Filled 2024-02-20 (×18): qty 5

## 2024-02-20 MED ORDER — CHLORHEXIDINE GLUCONATE CLOTH 2 % EX PADS
6.0000 | MEDICATED_PAD | Freq: Every day | CUTANEOUS | Status: DC
  Administered 2024-02-20 – 2024-02-27 (×8): 6 via TOPICAL

## 2024-02-20 NOTE — Progress Notes (Signed)
   02/20/24 0931  Vitals  Temp (!) 103 F (39.4 C)  Temp Source Axillary  BP (!) 158/93  MAP (mmHg) 105  BP Location Left Leg  BP Method Automatic  Patient Position (if appropriate) Lying  Pulse Rate (!) 138  Pulse Rate Source Dinamap  Resp (!) 28  Level of Consciousness  Level of Consciousness Responds to Pain  MEWS COLOR  MEWS Score Color Red  Oxygen Therapy  SpO2 95 %  O2 Device Nasal Cannula  O2 Flow Rate (L/min) 3 L/min  MEWS Score  MEWS Temp 2  MEWS Systolic 0  MEWS Pulse 3  MEWS RR 2  MEWS LOC 2  MEWS Score 9  Provider Notification  Provider Name/Title Dr. Renie Carver  Date Provider Notified 02/20/24  Time Provider Notified 0930  Method of Notification Face-to-face  Notification Reason Change in status (seizure, febrile)  Provider response At bedside;See new orders  Date of Provider Response 02/20/24  Time of Provider Response 0930  Rapid Response Notification  Name of Rapid Response RN Notified Ethan  Date Rapid Response Notified 02/20/24  Time Rapid Response Notified 0941

## 2024-02-20 NOTE — Consult Note (Signed)
 NEUROLOGY CONSULT NOTE   Date of service: Feb 20, 2024 Patient Name: Yvette Gutierrez MRN:  [id number] DOB:  1946-02-04 Chief Complaint: "MAS, seizure" Requesting Provider: Sedalia Dacosta, MD  History of Present Illness  Yvette Gutierrez is a 78 y.o. female with hx of seizure, dementia, bed bound at baseline, DM2, HTN, HLD, CVA, depression who was admitted for seizure activity.  Noted that patient had seizure-like acitivty approximately a week ago that resolved witout intervention, she was not worked up for this. Om 5/8, at her facility, she was noted to have a generalized tonic-clonic seizure. In ED, patient had a fever of 102.7, tachycardic in the 120s and she was noted to be lethargic. Infectious labs were sent with UA coming back positive. Routine EEG 5/9 was negative. However, further seizure activity was seen by providers and neurology was consulted for continued witnessed seizure activity on 5/10.  Patient has a history of seizure disorder, on Vimpat  50mg  Q12H at home. On review, patient did not receive this medication 5/9 pm or 5/10 am. Patient was given one dose of Cefepime 5/10am. On admission, her fever was 102.7, this has come down to 99.1 with tylenol , cooling blanket and antibiotic therapy.    ROS   Unable to ascertain due to AMS  Past History   Past Medical History:  Diagnosis Date   Allergic rhinitis    Bilateral shoulder pain    chronic-uses vicodin a few times a month for pain control   CVA (cerebral infarction) 2002   Chronic unsteadiness   Dementia (HCC)    Diabetes mellitus type 2 with complications (HCC)    Femur fracture, left (HCC) 09/2014   nonsurgical treatment, immobilized   HTN (hypertension)    Hyperlipidemia    Major depressive disorder, recurrent episode, mild (HCC)    Neuropathy due to secondary diabetes (HCC)    Obesity    Seizure disorder (HCC)    Slow transit constipation    Vitamin D  deficiency     Past Surgical History:  Procedure Laterality Date    FEMUR IM NAIL Left 06/16/2021   Procedure: INTRAMEDULLARY (IM) NAIL FEMORAL;  Surgeon: Osa Blase, MD;  Location: [facility];  Service: Orthopedics;  Laterality: Left;   NSVD  [phone]   ventral hernia repair  1999    Family History: Family History  Problem Relation Age of Onset   Heart attack Father 67   Stroke Father 58   Coronary artery disease Mother    Diabetes Mother    Hypertension Mother    Stroke Mother    Dementia Mother    Diabetes Sister    Sudden death Maternal Uncle    Colon cancer Neg Hx     Social History  reports that she has never smoked. She has never used smokeless tobacco. She reports that she does not drink alcohol and does not use drugs.  No Known Allergies  Medications   Current Facility-Administered Medications:    0.9 %  sodium chloride  infusion, , Intravenous, PRN, Rizwan, Saima, MD, Last Rate: 10 mL/hr at 02/20/24 1118, New Bag at 02/20/24 1118   0.9 %  sodium chloride  infusion, , Intravenous, Continuous, Rizwan, Saima, MD, Last Rate: 125 mL/hr at 02/20/24 1242, New Bag at 02/20/24 1242   acetaminophen  (TYLENOL ) tablet 650 mg, 650 mg, Oral, Q6H PRN, 650 mg at 02/19/24 1743 **OR** acetaminophen  (TYLENOL ) suppository 650 mg, 650 mg, Rectal, Q6H PRN, Dahal, Binaya, MD, 650 mg at 02/20/24 1120   albuterol  (PROVENTIL ) (2.5 MG/3ML) 0.083%  nebulizer solution 2.5 mg, 2.5 mg, Nebulization, Q6H PRN, Dahal, Binaya, MD   bisacodyl  (DULCOLAX) EC tablet 5 mg, 5 mg, Oral, Daily PRN, Dahal, Binaya, MD   Chlorhexidine Gluconate Cloth 2 % PADS 6 each, 6 each, Topical, Daily, Rizwan, Saima, MD, 6 each at 02/20/24 1204   dipyridamole -aspirin  (AGGRENOX ) 200-25 MG per 12 hr capsule 1 capsule, 1 capsule, Oral, BID, Rizwan, Saima, MD, 1 capsule at 02/19/24 1453   ferrous sulfate  tablet 325 mg, 325 mg, Oral, Q breakfast, Dahal, Binaya, MD, 325 mg at 02/19/24 0849   hydrALAZINE  (APRESOLINE ) injection 10 mg, 10 mg, Intravenous, Q6H PRN, Dahal, Aminta Baldy, MD   insulin   aspart (novoLOG ) injection 0-5 Units, 0-5 Units, Subcutaneous, QHS, Dahal, Binaya, MD, 4 Units at 02/19/24 2255   insulin  aspart (novoLOG ) injection 0-9 Units, 0-9 Units, Subcutaneous, TID WC, Dahal, Binaya, MD, 5 Units at 02/20/24 1245   insulin  glargine-yfgn (SEMGLEE ) injection 10 Units, 10 Units, Subcutaneous, BID, Rizwan, Saima, MD, 10 Units at 02/20/24 0947   lacosamide  (VIMPAT ) 100 mg in sodium chloride  0.9 % 25 mL IVPB, 100 mg, Intravenous, Once, Lyndell Gillyard, MD   lacosamide  (VIMPAT ) tablet 50 mg, 50 mg, Oral, BID **OR** lacosamide  (VIMPAT ) 50 mg in sodium chloride  0.9 % 25 mL IVPB, 50 mg, Intravenous, BID, Florrie Ramires, MD   levETIRAcetam (KEPPRA) undiluted injection 1,500 mg, 1,500 mg, Intravenous, Once, Aza Dantes, MD, 1,500 mg at 02/20/24 1250   linezolid (ZYVOX) IVPB 600 mg, 600 mg, Intravenous, Q12H, Rizwan, Saima, MD, Last Rate: 300 mL/hr at 02/20/24 1101, 600 mg at 02/20/24 1101   memantine (NAMENDA) tablet 10 mg, 10 mg, Oral, BID, Rizwan, Saima, MD, 10 mg at 02/19/24 1453   NIFEdipine  (PROCARDIA -XL/NIFEDICAL-XL) 24 hr tablet 30 mg, 30 mg, Oral, Daily, Rizwan, Saima, MD, 30 mg at 02/19/24 1453   Oral care mouth rinse, 15 mL, Mouth Rinse, PRN, Rizwan, Saima, MD   piperacillin-tazobactam (ZOSYN) IVPB 3.375 g, 3.375 g, Intravenous, Q8H, Roselee Cong, RPH, Last Rate: 12.5 mL/hr at 02/20/24 1229, 3.375 g at 02/20/24 1229   polyethylene glycol (MIRALAX  / GLYCOLAX ) packet 17 g, 17 g, Oral, Daily PRN, Dahal, Binaya, MD   senna-docusate (Senokot-S) tablet 2 tablet, 2 tablet, Oral, QHS, Dahal, Binaya, MD   simvastatin  (ZOCOR ) tablet 10 mg, 10 mg, Oral, QHS, Rizwan, Leota Randy, MD  Vitals   Vitals:   02/20/24 0931 02/20/24 1050 02/20/24 1200 02/20/24 1239  BP: (!) 158/93 130/69    Pulse: (!) 138 (!) 128    Resp: (!) 28 (!) 32    Temp: (!) 103 F (39.4 C) (!) 103.1 F (39.5 C) (!) 101 F (38.3 C) 99.1 F (37.3 C)  TempSrc: Axillary Oral Axillary Axillary  SpO2: 95% 98%     Weight:      Height:        Body mass index is 27.77 kg/m.  Physical Exam   General: Appears frail, somewhat disheveled.  Laying comfortably in bed; in no acute distress.  HENT: Normal oropharynx and mucosa. Normal external appearance of ears and nose.  Neck: Supple, no pain or tenderness  CV: No JVD. No peripheral edema.  Pulmonary: Symmetric Chest rise. Normal respiratory effort.  Abdomen: Soft to touch, non-tender.  Ext: No cyanosis, edema, or deformity  Skin: No rash. Normal palpation of skin.   Musculoskeletal: Normal digits and nails by inspection. No clubbing.   Neurologic Examination  Mental status/Cognition: Eyes open, she makes good eye contact.  She does not answer any orientation question.  However, earlier was  able to answer her name for her the RN.  When asking if she is in pain, patient nods her head but she is unable to tell me where her pain is. Speech/language: Mostly moans and groans.  No speech output during my evaluation.  She does seem to squeeze my fingers on command.  She did nod her head when we asked her about whether she is in pain. cranial nerves:   CN II Pupils equal and reactive to light, she blinks to threat bilaterally.   CN III,IV,VI She makes eye contact on left and right side.   CN V Corneals are intact bilaterally   CN VII Symmetric facial movement noted when eliciting Babinski.   CN VIII Makes eye contact to speech.   CN IX & X Seems to be protecting her airway right now.   CN XI Head is midline.   CN XII Does not protrude tongue on command.  Resists mouth opening.   Motor:  Muscle bulk: Poor, tone normal She squeeze my fingers with bilateral hands on command. She withdraws bilateral lower extremities to Babinski's. Unable to do detailed strength testing secondary to encephalopathy.  Sensation:  Light touch    Pin prick She localizes her proximal pinch in all extremities.   Temperature    Vibration   Proprioception     Coordination/Complex Motor:  Unable to assess.  Labs/Imaging/Neurodiagnostic studies   CBC:  Recent Labs  Lab 02/26/2024 1710 02/19/24 0816  WBC 6.9 6.4  NEUTROABS 6.3  --   HGB 9.5* 8.8*  HCT 32.1* 32.4*  MCV 89.2 94.5  PLT 192 147*   Basic Metabolic Panel:  Lab Results  Component Value Date   NA 138 02/20/2024   K 3.9 02/20/2024   CO2 17 (L) 02/20/2024   GLUCOSE 398 (H) 02/20/2024   BUN 32 (H) 02/20/2024   CREATININE 1.41 (H) 02/20/2024   CALCIUM 8.8 (L) 02/20/2024   GFRNONAA 38 (L) 02/20/2024   GFRAA 57 (L) 10/06/2014   Lipid Panel:  Lab Results  Component Value Date   LDLCALC 52 01/05/2017   HgbA1c:  Lab Results  Component Value Date   HGBA1C 7.9 (H) 02/26/2024   Urine Drug Screen:     Component Value Date/Time   LABOPIA NEGATIVE 08/28/2014 1004   COCAINSCRNUR NEGATIVE 08/28/2014 1004   LABBENZ NEGATIVE 08/28/2014 1004   AMPHETMU NEGATIVE 08/28/2014 1004   THCU NEGATIVE 08/28/2014 1004   LABBARB NEGATIVE 08/28/2014 1004    INR  Lab Results  Component Value Date   INR 1.0 10/06/2014   APTT  Lab Results  Component Value Date   APTT 29.3 10/06/2014   AED levels: No results found for: "PHENYTOIN", "ZONISAMIDE", "LAMOTRIGINE", "LEVETIRACETA" Lacosamide  Level: PENDING  Imaging: CT head without contrast is pending.  Neurodiagnostics rEEG 5/9:  This study is within normal limits. No seizures or epileptiform discharges were seen throughout the recording.   ASSESSMENT   KEELAN GALLIGHER is a 78 y.o. female with hx of seizure, dementia, bed bound at baseline, DM2, HTN, HLD, CVA, depression who was admitted for seizure activity.   On 2024/02/26, she was noted to have a generalized tonic-clonic seizure. Routine EEG 5/9 was negative, but further seizure activity has been witnessed.   Patient has a history of seizure disorder, on Vimpat  50mg  Q12H at home. On chart review, it looks like she has been on this same medication and same dose since at least 2015. On  review, patient did not receive this medication 5/9 pm or  5/10 am. Patient was given one dose of Cefepime 5/10am. On admission, her fever was 102.7, noted to be 103 this morning, this has come down to 99.1 with tylenol , cooling blanket and antibiotic therapy.   Patient has been on aggrenox  post-CVA since at least 2008. Most recent brain imaging seen on chat review is in 2015, MRI showing no acute findings and chronic lacunar infarcts.   Blood cultures, Resp panel negative. UA is positive with culture pending.   Impression: Breakthrough seizures in patient with known seizure disorder secondary to infectious process lowering seizure threshold, compounded by seizure medications not received for around 24 hours while inpatient.   RECOMMENDATIONS   - CT Head - Keppra load 1500mg  IV now (ordered). No maintenance Keppra. - Vimpat  100mg  extra dose now (ordered) - Increase Vimpat  from 50mg  BID to 100mg  BID - Change Vimpat  to PO or IV to ensure administration - Continue treating UTI, avoid cefepime - Continue IVF to avoid dehydration and hypotension ______________________________________________________________________   Cherl Corner, NP [facility]    NEUROHOSPITALIST ADDENDUM Performed a face to face diagnostic evaluation.   I have reviewed the contents of history and physical exam as documented by PA/ARNP/Resident and agree with above documentation.  I have discussed and formulated the above plan as documented. Edits to the note have been made as needed.  Impression/Key exam findings/Plan: She was unable to take p.o. Vimpat  due to confusion and unfortunately ended up missing her dose last night and today in the morning.  She had a seizure subsequently.  She is at risk for seizure with her history and with her current UTI and fever with Tmax of 103.  I have low suspicion that she is in status epilepticus or having further subclinical seizures.  I spoke with her son over  phone and he reports the patient has advanced dementia with very limited speech output at baseline.  Patient is bedbound or wheelchair dependent at baseline.  She is only sometimes able to express her needs, rarely asks for food.  She requires around-the-clock total care.  Patient had a routine EEG yesterday which was negative for seizures.  I am having a hard time locating recent head imaging and will therefore get a CT head without contrast.  Patient is on Aggrenox /combination of aspirin  and dipyridamole .  I do think getting a CT head without contrast would be helpful.  I will think we need to follow closely.  My overall suspicion for recurrent seizure is low since we switched her Vimpat  to IV or p.o. as tolerated by patient.  This would ensure that she does not miss any doses of Vimpat  going forward.  I had extensive discussion with son and updated him from neurostandpoint about our plan.  He was very appreciated with me calling him and discussing things in detail.  Dot Splinter, MD [facility] [phone]   If 7pm to 7am, please call on call as listed on AMION.

## 2024-02-20 NOTE — Progress Notes (Signed)
 Patient's son at bedside, per Hinton Luis (son), ok to add patient's daughter as POC but Hinton Luis is the power of attorney and the patient's daughter cannot make decisions regarding the patient.

## 2024-02-20 NOTE — Progress Notes (Addendum)
 [facility] Progress Note  Patient: Yvette Gutierrez     ONG:[id number]  DOA: 02/18/2024   PCP: Patient, No Pcp Per       Brief hospital course: This is a 78 year old female with dementia, diabetes, hypertension, history of CVA, hyperlipidemia, depression who is bedbound and resides in a nursing facility (for the past 10 years).  She sent to the hospital for seizure-like activity followed by lethargy.  She appeared to have a generalized/tonic-clonic seizure subsequently was lethargic and sent to the ED.  Per history, she had a seizure last week as well.  The patient takes Lacosamide .   In the ED: Fever 102.7, heart rate 110, lactic acid 3.7 followed by 1.7. Serum bicarbonate 15 BUN 39 and creatinine 1.6. Further workup revealed UA showing large leukocytes and many bacteria with a small amount of hemoglobin and negative nitrite. Patient continued to be somnolent in the ED. DNR/DNI   The patient was found this morning by the RN and myself with eyes closed, heavy breathing and shaking of her body (similar to rigors) with frothing at the mouth.  Temperature was 103 degrees.  Last temp checked at 4 AM was 98.3. She was given a dose of IV Ativan  1mg , 15 mg of IV Toradol and subsequently was able to respond to the pain but continued to have shaking and heavy breathing.  Continued to have a temperature of 103.  Started on Zyvox, Zosyn, IV fluid bolus, and then bolused with IV Keppra and Lamictal.-I evaluated her 2 more times throughout the day.  Assessment and Plan: Principal Problem: Breakthrough seizure in the setting of UTI - Lacosamide  level is still in process - Urine culture revealsGreater than 100,000 gram-negative rods - blood cultures pending   -2 ongoing febrile, Rocephin  changed to Zyvox and Zosyn - IV Keppra and Lamictal ordered by neurology-patient did not receive her evening dose of Lamictal yesterday - Aggressive breakdown temperature with cooling blanket, Tylenol  and given a  dose of Toradol - The patient was transferred to the stepdown unit  Active Problems:  Lactic acidosis - Lactic acid 3.7-has improved to 1.7 after IV fluids - Continue IV fluids-500 cc bolus followed by 125 cc an hour of normal saline with  AKI? Metabolic acidosis -Creatinine 4.40-NUUVOZDG to 1.42 - Last creatinine in 2022 was 0.84 and GFR was greater than 60 - Bicarb 15> 19> 17  Normocytic anemia, thrombocytopenia - Hemoglobin was 9.5 and now is 8.8 -Plt 192> 147    Essential hypertension - Lisinopril  on hold for now - resumed Nifedipine  which she took yesterday but is not responsive enough to take today BP has been 105/49 and 96/45-hold nifedipine     DM type 2 with diabetic peripheral neuropathy, retinopathy - Janumet, NovoLog  sliding scale and Tresiba    History of cardioembolic cerebrovascular accident (CVA) - Aggrenox  and Zocor  resumed    Major depression Chronic, 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 disturbances - Namenda resumed    Bedbound      Code Status: Limited: Do not attempt resuscitation (DNR) -DNR-LIMITED -Do Not Intubate/DNI  Total time on patient care: 35 min DVT prophylaxis:  Place and maintain sequential compression device Start: 02/19/24 1150     Objective:   Vitals:   02/20/24 1050 02/20/24 1200 02/20/24 1239 02/20/24 1300  BP: 130/69 (!) 105/49  (!) 96/45  Pulse: (!) 128 (!) 112  (!) 105  Resp: (!) 32 (!) 21  (!) 24  Temp: (!) 103.1 F (39.5 C) (!) 101 F (38.3 C) 99.1 F (  37.3 C)   TempSrc: Oral Axillary Axillary   SpO2: 98% 98%  96%  Weight:  64 kg    Height:       Filed Weights   02/18/24 2215 02/20/24 1200  Weight: 64.5 kg 64 kg   Exam: General exam: Laying in bed eyes closed, body appears to be in rigors, extremities show increased tone, foamy saliva coming out of her mouth, does not open eyes, does not follow commands, feels warm to touch Respiratory system: Heavy breathing and hyperventilation Cardiovascular system: S1 & S2  heard, tachycardic Gastrointestinal system: Abdomen soft, non-tender, nondistended. Normal bowel sounds   Extremities: No cyanosis, clubbing or edema   CBC: Recent Labs  Lab 02/18/24 1710 02/19/24 0816  WBC 6.9 6.4  NEUTROABS 6.3  --   HGB 9.5* 8.8*  HCT 32.1* 32.4*  MCV 89.2 94.5  PLT 192 147*   Basic Metabolic Panel: Recent Labs  Lab 02/18/24 1710 02/19/24 0353 02/20/24 0409  NA 136 141 138  K 4.0 3.8 3.9  CL 105 111 112*  CO2 15* 19* 17*  GLUCOSE 278* 154* 398*  BUN 39* 34* 32*  CREATININE 1.60* 1.42* 1.41*  CALCIUM 9.3 8.5* 8.8*     Scheduled Meds:  Chlorhexidine Gluconate Cloth  6 each Topical Daily   dipyridamole -aspirin   1 capsule Oral BID   ferrous sulfate   325 mg Oral Q breakfast   insulin  aspart  0-5 Units Subcutaneous QHS   insulin  aspart  0-9 Units Subcutaneous TID WC   insulin  glargine-yfgn  10 Units Subcutaneous BID   lacosamide   50 mg Oral BID   memantine  10 mg Oral BID   NIFEdipine   30 mg Oral Daily   senna-docusate  2 tablet Oral QHS   simvastatin   10 mg Oral QHS    Imaging and lab data personally reviewed   Author: Kymere Fullington  02/20/2024 1:18 PM  To contact [facility]>   Check the care team in [facility] and look for the attending/consulting [facility] provider listed  Log into [email or web address] and use [place]'s universal password   Go to> "[facility]"  and find provider  If you still have difficulty reaching the provider, please page the [facility] (Director on Call) for the Hospitalists listed on amion

## 2024-02-20 NOTE — Progress Notes (Signed)
 Patient's daughter called inquiring about patient's status. Currently daughter is not listed as a POC (Tom, patient's son is primary POC). This nurse informed daughter to have the primary POC call to add her to the contact list so we can update her. Patient's daughter gave her verbal understanding of this.

## 2024-02-20 NOTE — Plan of Care (Signed)
  Problem: Coping: Goal: Ability to adjust to condition or change in health will improve Outcome: Not Progressing   Problem: Fluid Volume: Goal: Ability to maintain a balanced intake and output will improve Outcome: Not Progressing   Problem: Health Behavior/Discharge Planning: Goal: Ability to manage health-related needs will improve Outcome: Not Progressing   Problem: Nutritional: Goal: Maintenance of adequate nutrition will improve Outcome: Not Progressing   Problem: Education: Goal: Knowledge of General Education information will improve Description: Including pain rating scale, medication(s)/side effects and non-pharmacologic comfort measures Outcome: Not Progressing   Problem: Metabolic: Goal: Ability to maintain appropriate glucose levels will improve Outcome: Progressing   Problem: Clinical Measurements: Goal: Diagnostic test results will improve Outcome: Progressing Goal: Respiratory complications will improve Outcome: Progressing Goal: Cardiovascular complication will be avoided Outcome: Progressing

## 2024-02-21 DIAGNOSIS — G40909 Epilepsy, unspecified, not intractable, without status epilepticus: Secondary | ICD-10-CM | POA: Diagnosis not present

## 2024-02-21 DIAGNOSIS — Z7189 Other specified counseling: Secondary | ICD-10-CM | POA: Diagnosis not present

## 2024-02-21 DIAGNOSIS — N390 Urinary tract infection, site not specified: Secondary | ICD-10-CM | POA: Diagnosis not present

## 2024-02-21 DIAGNOSIS — A419 Sepsis, unspecified organism: Secondary | ICD-10-CM | POA: Diagnosis not present

## 2024-02-21 LAB — BASIC METABOLIC PANEL WITH GFR
Anion gap: 12 (ref 5–15)
BUN: 34 mg/dL — ABNORMAL HIGH (ref 8–23)
CO2: 18 mmol/L — ABNORMAL LOW (ref 22–32)
Calcium: 8 mg/dL — ABNORMAL LOW (ref 8.9–10.3)
Chloride: 108 mmol/L (ref 98–111)
Creatinine, Ser: 1.63 mg/dL — ABNORMAL HIGH (ref 0.44–1.00)
GFR, Estimated: 32 mL/min — ABNORMAL LOW (ref >60)
Glucose, Bld: 395 mg/dL — ABNORMAL HIGH (ref 70–99)
Potassium: 3.5 mmol/L (ref 3.5–5.1)
Sodium: 138 mmol/L (ref 135–145)

## 2024-02-21 LAB — GLUCOSE, CAPILLARY
Glucose-Capillary: 337 mg/dL — ABNORMAL HIGH (ref 70–99)
Glucose-Capillary: 320 mg/dL — ABNORMAL HIGH (ref 70–99)
Glucose-Capillary: 255 mg/dL — ABNORMAL HIGH (ref 70–99)
Glucose-Capillary: 362 mg/dL — ABNORMAL HIGH (ref 70–99)

## 2024-02-21 LAB — CBC
HCT: 26.8 % — ABNORMAL LOW (ref 36.0–46.0)
Hemoglobin: 7.5 g/dL — ABNORMAL LOW (ref 12.0–15.0)
MCH: 26.3 pg (ref 26.0–34.0)
MCHC: 28 g/dL — ABNORMAL LOW (ref 30.0–36.0)
MCV: 94 fL (ref 80.0–100.0)
Platelets: 111 10*3/uL — ABNORMAL LOW (ref 150–400)
RBC: 2.85 MIL/uL — ABNORMAL LOW (ref 3.87–5.11)
RDW: 15.9 % — ABNORMAL HIGH (ref 11.5–15.5)
WBC: 10.5 10*3/uL (ref 4.0–10.5)
nRBC: 0 % (ref 0.0–0.2)

## 2024-02-21 LAB — LACOSAMIDE: Lacosamide: 6.1 ug/mL (ref 5.0–10.0)

## 2024-02-21 MED ORDER — ONDANSETRON HCL 4 MG/2ML IJ SOLN
4.0000 mg | Freq: Four times a day (QID) | INTRAMUSCULAR | Status: DC | PRN
  Administered 2024-02-22: 4 mg via INTRAVENOUS
  Filled 2024-02-21: qty 2

## 2024-02-21 MED ORDER — INSULIN GLARGINE-YFGN 100 UNIT/ML ~~LOC~~ SOLN
15.0000 [IU] | Freq: Two times a day (BID) | SUBCUTANEOUS | Status: DC
  Administered 2024-02-21 – 2024-02-22 (×2): 15 [IU] via SUBCUTANEOUS
  Filled 2024-02-21 (×2): qty 0.15

## 2024-02-21 NOTE — Plan of Care (Signed)
  Problem: Coping: Goal: Ability to adjust to condition or change in health will improve Outcome: Not Progressing   Problem: Nutritional: Goal: Maintenance of adequate nutrition will improve Outcome: Not Progressing   Problem: Clinical Measurements: Goal: Cardiovascular complication will be avoided Outcome: Not Progressing   Problem: Coping: Goal: Level of anxiety will decrease Outcome: Not Progressing

## 2024-02-21 NOTE — Progress Notes (Signed)
 PT still refusing to open her mouth. She is not allowing us  to give her oral medication, provide oral care, give her anything to eat or drink.

## 2024-02-21 NOTE — Progress Notes (Signed)
 [facility] Progress Note  Patient: Yvette Gutierrez     QMV:[id number]  DOA: 02/18/2024   PCP: Patient, No Pcp Per       Brief hospital course: This is a 78 year old female with dementia, diabetes, hypertension, history of CVA, hyperlipidemia, depression who is bedbound and resides in a nursing facility (for the past 10 years).  She sent to the hospital for seizure-like activity followed by lethargy.  She appeared to have a generalized/tonic-clonic seizure subsequently was lethargic and sent to the ED.  Per history, she had a seizure last week as well.  The patient takes Lacosamide .   In the ED: Fever 102.7, heart rate 110, lactic acid 3.7 followed by 1.7. Serum bicarbonate 15 BUN 39 and creatinine 1.6. Further workup revealed UA showing large leukocytes and many bacteria with a small amount of hemoglobin and negative nitrite. Patient continued to be somnolent in the ED. DNR/DNI   5/10:  The patient was found this morning by the RN and myself with eyes closed, heavy breathing and shaking of her body (similar to rigors) with frothing at the mouth.  Temperature was 103 degrees.  Last temp checked at 4 AM was 98.3. She was given a dose of IV Ativan  1mg , 15 mg of IV Toradol and subsequently was able to respond to the pain but continued to have shaking and heavy breathing.  Continued to have a temperature of 103.  Started on Zyvox, Zosyn, IV fluid bolus, and then bolused with IV Keppra and Lamictal.-I evaluated her 2 more times throughout the day.  5/11: Evaluated this morning.  Says "hmm" when her name is spoken.  Attempts to open her eyes when asked to but then squeezes them shut when manually attempted.  Does not open her mouth and has not taken any medications.   Assessment and Plan: Principal Problem: Breakthrough seizure in the setting of UTI - Lacosamide  level is still in process - Urine culture reveals greater than 100,000 gram-negative rods - blood cultures pending   -  Rocephin  changed to Zyvox and Zosyn - IV Keppra and Lacosamide  ordered by neurology-patient did not receive her evening dose of Lacosamide  yesterday - Aggressive control of temperature   - The patient was transferred to the stepdown unit -5/11-no fevers or seizure like activity since yesterday-urine culture still has not been finalized-continue Zyvox, Zosyn, Keppra, lacosamide   Active Problems:  Lactic acidosis - Lactic acid 3.7-has improved to 1.7 after IV fluids - Continue IV fluids-500 cc bolus followed by 125 cc an hour of normal saline with  AKI? Metabolic acidosis -Creatinine 2.84-[id number] to 1.41 and then was 1.63 - Last creatinine in 2022 was 0.84 and GFR was greater than 60 - Bicarb 15> 19> 17>12 - Bicarb infusion initiated on 5/10 with improvement noted in serum bicarb level - Creatinine is still 1.63 today - She has a Foley catheter which is draining clear urine   Normocytic anemia, thrombocytopenia - Hemoglobin 9.5> 8.8> 7.5 -Plt 192> 147> 111 - Suspect some of this is dilutional-some is secondary to acute illness  Essential hypertension - Lisinopril  and nifedipine  on hold   DM type 2 with diabetic peripheral neuropathy, retinopathy - Janumet, NovoLog  sliding scale and Tresiba - glucose elevated in setting of acute infection- cont to titrate insulin -Lantus  was increased to 10 twice daily yesterday - Will increase to 15 twice daily today - She has not been eating due to being unresponsive but is receiving NovoLog  to help control her sugars   History of cardioembolic cerebrovascular  accident (CVA) - Aggrenox  and Zocor  resumed  Major depression Chronic, 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   Weight as of this  encounter: 64 kg.   Bed bound  Disposition: Spoke with Alyn Judge, her son, [place], who does not feel she would want a feeding tube.  He is open to having further palliative care discussions about her.    Code Status: Limited: Do not attempt resuscitation (DNR) -DNR-LIMITED -Do Not Intubate/DNI  Total time on patient care: 35 min DVT prophylaxis:  Place and maintain sequential compression device Start: 02/19/24 1150     Objective:   Vitals:   02/21/24 0700 02/21/24 0800 02/21/24 0900 02/21/24 0918  BP: (!) 93/42 (!) 93/39 (!) 108/55   Pulse: 73 71 76 78  Resp: 20 17 (!) 25 (!) 22  Temp:  (!) 97.3 F (36.3 C)  97.7 F (36.5 C)  TempSrc:  Axillary    SpO2: 99% 98% 99% 96%  Weight:      Height:       Filed Weights   02/18/24 2215 02/20/24 1200  Weight: 64.5 kg 64 kg   Exam: General exam: Appears to be resting comfortably this morning HEENT: Does not open her mouth or open her eyes, the nurse and I have manually opened her eyes and her pupils are reactive to light Respiratory system: Clear to auscultation.  Cardiovascular system: S1 & S2 heard  Gastrointestinal system: Abdomen soft, non-tender, nondistended. Normal bowel sounds   Extremities: No cyanosis, clubbing or edema   CBC: Recent Labs  Lab 02/18/24 1710 02/19/24 0816 02/21/24 0310  WBC 6.9 6.4 10.5  NEUTROABS 6.3  --   --   HGB 9.5* 8.8* 7.5*  HCT 32.1* 32.4* 26.8*  MCV 89.2 94.5 94.0  PLT 192 147* 111*   Basic Metabolic Panel: Recent Labs  Lab 02/18/24 1710 02/19/24 0353 02/20/24 0409 02/20/24 1628 02/21/24 0310  NA 136 141 138 139 138  K 4.0 3.8 3.9 3.6 3.5  CL 105 111 112* 113* 108  CO2 15* 19* 17* 12* 18*  GLUCOSE 278* 154* 398* 298* 395*  BUN 39* 34* 32* 31* 34*  CREATININE 1.60* 1.42* 1.41* 1.63* 1.63*  CALCIUM 9.3 8.5* 8.8* 8.0* 8.0*  MG  --   --   --  1.4*  --      Scheduled Meds:  Chlorhexidine Gluconate Cloth  6 each Topical Daily   dipyridamole -aspirin   1 capsule Oral BID   ferrous  sulfate  325 mg Oral Q breakfast   insulin  aspart  0-5 Units Subcutaneous QHS   insulin  aspart  0-9 Units Subcutaneous TID WC   insulin  glargine-yfgn  10 Units Subcutaneous BID   lacosamide   100 mg Oral BID   memantine  10 mg Oral BID   metoprolol tartrate  2.5 mg Intravenous Q6H   NIFEdipine   30 mg Oral Daily   senna-docusate  2 tablet Oral QHS   simvastatin   10 mg Oral QHS    Imaging and lab data personally reviewed   Author: Bryn Saline  02/21/2024 2:01 PM  To contact [facility]>   Check the care team in [facility] and look for the attending/consulting [facility] provider listed  Log into [email or web address] and use 's universal password   Go  to> "[facility]"  and find provider  If you still have difficulty reaching the provider, please page the [facility] (Director on Call) for the Hospitalists listed on amion

## 2024-02-21 NOTE — Plan of Care (Signed)
   Problem: Education: Goal: Ability to describe self-care measures that may prevent or decrease complications (Diabetes Survival Skills Education) will improve Outcome: Progressing Goal: Individualized Educational Video(s) Outcome: Progressing   Problem: Health Behavior/Discharge Planning: Goal: Ability to identify and utilize available resources and services will improve Outcome: Progressing Goal: Ability to manage health-related needs will improve Outcome: Progressing

## 2024-02-22 DIAGNOSIS — Z7189 Other specified counseling: Secondary | ICD-10-CM | POA: Diagnosis not present

## 2024-02-22 DIAGNOSIS — Z515 Encounter for palliative care: Secondary | ICD-10-CM | POA: Diagnosis not present

## 2024-02-22 DIAGNOSIS — G40909 Epilepsy, unspecified, not intractable, without status epilepticus: Secondary | ICD-10-CM | POA: Diagnosis not present

## 2024-02-22 DIAGNOSIS — Z7401 Bed confinement status: Secondary | ICD-10-CM | POA: Diagnosis not present

## 2024-02-22 DIAGNOSIS — N3000 Acute cystitis without hematuria: Secondary | ICD-10-CM | POA: Diagnosis not present

## 2024-02-22 LAB — BASIC METABOLIC PANEL WITH GFR
Anion gap: 14 (ref 5–15)
Anion gap: 15 (ref 5–15)
BUN: 32 mg/dL — ABNORMAL HIGH (ref 8–23)
BUN: 26 mg/dL — ABNORMAL HIGH (ref 8–23)
CO2: 26 mmol/L (ref 22–32)
CO2: 28 mmol/L (ref 22–32)
Calcium: 7.9 mg/dL — ABNORMAL LOW (ref 8.9–10.3)
Calcium: 8.1 mg/dL — ABNORMAL LOW (ref 8.9–10.3)
Chloride: 96 mmol/L — ABNORMAL LOW (ref 98–111)
Chloride: 97 mmol/L — ABNORMAL LOW (ref 98–111)
Creatinine, Ser: 1.8 mg/dL — ABNORMAL HIGH (ref 0.44–1.00)
Creatinine, Ser: 1.26 mg/dL — ABNORMAL HIGH (ref 0.44–1.00)
GFR, Estimated: 28 mL/min — ABNORMAL LOW (ref >60)
GFR, Estimated: 44 mL/min — ABNORMAL LOW (ref >60)
Glucose, Bld: 348 mg/dL — ABNORMAL HIGH (ref 70–99)
Glucose, Bld: 275 mg/dL — ABNORMAL HIGH (ref 70–99)
Potassium: 2.8 mmol/L — ABNORMAL LOW (ref 3.5–5.1)
Potassium: 3.4 mmol/L — ABNORMAL LOW (ref 3.5–5.1)
Sodium: 136 mmol/L (ref 135–145)
Sodium: 140 mmol/L (ref 135–145)

## 2024-02-22 LAB — GLUCOSE, CAPILLARY
Glucose-Capillary: 295 mg/dL — ABNORMAL HIGH (ref 70–99)
Glucose-Capillary: 267 mg/dL — ABNORMAL HIGH (ref 70–99)
Glucose-Capillary: 198 mg/dL — ABNORMAL HIGH (ref 70–99)
Glucose-Capillary: 141 mg/dL — ABNORMAL HIGH (ref 70–99)
Glucose-Capillary: 125 mg/dL — ABNORMAL HIGH (ref 70–99)

## 2024-02-22 LAB — CBC
HCT: 29.6 % — ABNORMAL LOW (ref 36.0–46.0)
Hemoglobin: 8.4 g/dL — ABNORMAL LOW (ref 12.0–15.0)
MCH: 25.8 pg — ABNORMAL LOW (ref 26.0–34.0)
MCHC: 28.4 g/dL — ABNORMAL LOW (ref 30.0–36.0)
MCV: 90.8 fL (ref 80.0–100.0)
Platelets: 142 10*3/uL — ABNORMAL LOW (ref 150–400)
RBC: 3.26 MIL/uL — ABNORMAL LOW (ref 3.87–5.11)
RDW: 15.9 % — ABNORMAL HIGH (ref 11.5–15.5)
WBC: 11.2 10*3/uL — ABNORMAL HIGH (ref 4.0–10.5)
nRBC: 0 % (ref 0.0–0.2)

## 2024-02-22 LAB — URINE CULTURE: Culture: >=100000 — AB

## 2024-02-22 LAB — MAGNESIUM: Magnesium: 2.2 mg/dL (ref 1.7–2.4)

## 2024-02-22 MED ORDER — INSULIN GLARGINE-YFGN 100 UNIT/ML ~~LOC~~ SOLN
15.0000 [IU] | Freq: Two times a day (BID) | SUBCUTANEOUS | Status: DC
  Administered 2024-02-22: 15 [IU] via SUBCUTANEOUS
  Filled 2024-02-22 (×2): qty 0.15

## 2024-02-22 MED ORDER — INSULIN GLARGINE-YFGN 100 UNIT/ML ~~LOC~~ SOLN
18.0000 [IU] | Freq: Two times a day (BID) | SUBCUTANEOUS | Status: DC
  Filled 2024-02-22: qty 0.18

## 2024-02-22 MED ORDER — POTASSIUM CHLORIDE 10 MEQ/100ML IV SOLN
10.0000 meq | INTRAVENOUS | Status: AC
  Administered 2024-02-22 (×6): 10 meq via INTRAVENOUS
  Filled 2024-02-22 (×6): qty 100

## 2024-02-22 MED ORDER — INSULIN ASPART 100 UNIT/ML IJ SOLN
0.0000 [IU] | INTRAMUSCULAR | Status: DC
  Administered 2024-02-22: 1 [IU] via SUBCUTANEOUS
  Administered 2024-02-22 – 2024-02-23 (×2): 2 [IU] via SUBCUTANEOUS
  Administered 2024-02-24: 1 [IU] via SUBCUTANEOUS
  Administered 2024-02-24: 3 [IU] via SUBCUTANEOUS
  Administered 2024-02-24 (×2): 2 [IU] via SUBCUTANEOUS
  Administered 2024-02-25: 9 [IU] via SUBCUTANEOUS
  Administered 2024-02-25: 3 [IU] via SUBCUTANEOUS
  Administered 2024-02-25 (×2): 7 [IU] via SUBCUTANEOUS
  Administered 2024-02-25: 2 [IU] via SUBCUTANEOUS
  Administered 2024-02-25: 3 [IU] via SUBCUTANEOUS
  Administered 2024-02-26: 7 [IU] via SUBCUTANEOUS

## 2024-02-22 MED ORDER — ORAL CARE MOUTH RINSE
15.0000 mL | OROMUCOSAL | Status: DC
  Administered 2024-02-24 – 2024-02-27 (×9): 15 mL via OROMUCOSAL

## 2024-02-22 MED ORDER — NYSTATIN 100000 UNIT/GM EX POWD
Freq: Three times a day (TID) | CUTANEOUS | Status: AC
  Filled 2024-02-22: qty 15

## 2024-02-22 MED ORDER — SODIUM CHLORIDE 0.9 % IV SOLN
INTRAVENOUS | Status: DC
Start: 2024-02-22 — End: 2024-02-23

## 2024-02-22 MED ORDER — POTASSIUM CHLORIDE 20 MEQ PO PACK: 60.0000 meq | PACK | Freq: Once | ORAL | Status: DC

## 2024-02-22 MED ORDER — POTASSIUM CHLORIDE 10 MEQ/100ML IV SOLN: 10.0000 meq | INTRAVENOUS | Status: DC

## 2024-02-22 MED ORDER — CEFAZOLIN SODIUM-DEXTROSE 2-4 GM/100ML-% IV SOLN
2.0000 g | Freq: Two times a day (BID) | INTRAVENOUS | Status: DC
  Administered 2024-02-22 – 2024-02-23 (×2): 2 g via INTRAVENOUS
  Filled 2024-02-22 (×2): qty 100

## 2024-02-22 MED ORDER — ENSURE ENLIVE PO LIQD: 237.0000 mL | Freq: Two times a day (BID) | ORAL | Status: DC

## 2024-02-22 NOTE — Inpatient Diabetes Management (Signed)
 Inpatient Diabetes Program Recommendations  AACE/ADA: New Consensus Statement on Inpatient Glycemic Control (2015)  Target Ranges:  Prepandial:   less than 140 mg/dL      Peak postprandial:   less than 180 mg/dL (1-2 hours)      Critically ill patients:  140 - 180 mg/dL   Lab Results  Component Value Date   GLUCAP 295 (H) 02/22/2024   HGBA1C 7.9 (H) 02/18/2024    Review of Glycemic Control  Latest Reference Range & Units 02/20/24 16:04 02/20/24 22:50 02/21/24 08:18 02/21/24 11:51 02/21/24 16:29 02/21/24 22:18 02/22/24 07:37  Glucose-Capillary 70 - 99 mg/dL 409 (H) 811 (H) 914 (H) 320 (H) 255 (H) 362 (H) 295 (H)  (H): Data is abnormally high Diabetes history: Type 2 DM Outpatient Diabetes medications: Janumet 50-1000 mg BID, Novolog  0-12 units TID, Tresiba 13 units QD Current orders for Inpatient glycemic control: Semglee  15 units BID,  Novolog  0-9 units TID & HS  Inpatient Diabetes Program Recommendations:    If intake continues to remain poor, consider changing correction to Q4H.   Thanks, Marjo Sievert, MSN, RNC-OB Diabetes Coordinator [phone] (8a-5p)

## 2024-02-22 NOTE — Progress Notes (Incomplete Revision)
 [facility] Progress Note  Patient: Yvette Gutierrez     NFA:[id number]  DOA: 02/18/2024   PCP: Patient, No Pcp Per       Brief hospital course: This is a 78 year old female with dementia, diabetes, hypertension, history of CVA, hyperlipidemia, depression who is bedbound and resides in a nursing facility (for the past 10 years).  She sent to the hospital for seizure-like activity followed by lethargy.  She appeared to have a generalized/tonic-clonic seizure subsequently was lethargic and sent to the ED.  Per history, she had a seizure last week as well.  The patient takes Lacosamide .   In the ED: Fever 102.7, heart rate 110, lactic acid 3.7 followed by 1.7. Serum bicarbonate 15 BUN 39 and creatinine 1.6. Further workup revealed UA showing large leukocytes and many bacteria with a small amount of hemoglobin and negative nitrite. Patient continued to be somnolent in the ED. DNR/DNI Goals of care discussion: POA- son, Kaytlynne Basil- DNR-DNI-no feeding tubes-okay to have IV interventions and noninvasive ventilation  5/10:  The patient was found this morning by the RN and myself with eyes closed, heavy breathing and shaking of her body (similar to rigors) with frothing at the mouth.  Temperature was 103 degrees.  Last temp checked at 4 AM was 98.3. She was given a dose of IV Ativan  1mg , 15 mg of IV Toradol and subsequently was able to respond to the pain but continued to have shaking and heavy breathing.  Continued to have a temperature of 103.  Started on Zyvox, Zosyn, IV fluid bolus, and then bolused with IV Keppra and Lamictal.-I evaluated her 2 more times throughout the day.   5/11: Evaluated this morning.  Says "hmm" when her name is spoken.  Attempts to open her eyes when asked to but then squeezes them shut when manually attempted.  Does not open her mouth and has not taken any medications.  5/12: Opens her eyes but does not follow commands or open her mouth.  Assessment and  Plan: Principal Problem: Breakthrough seizure in the setting of UTI - Lacosamide  level ordered on 5/8 finally resulted in the afternoon on 5/11-level was 6.1 with a range being between 5 and 10 mcg/mL -5/10-Rocephin  changed to Zyvox and Zosyn- IV Keppra and Lacosamide  ordered by neurology-patient did not receive her evening dose of Lacosamide  on 5/9 - The patient was transferred to the stepdown unit -5/11-no fevers or seizure like activity since yesterday-urine culture still has not been finalized-continue Zyvox, Zosyn, Keppra, lacosamide  - 5/12-Tmax 100.2 last night at 2101-urine culture reveals Klebsiella pneumonia resistant to ampicillin and nitrofurantoin-has been switched to Ancef -no seizure-like activity noted since 5/10  Active Problems:  Lactic acidosis - Lactic acid 3.7-has improved to 1.7 after IV fluids - Continue IV fluids-500 cc bolus followed by 125 cc an hour of normal saline with  AKI-suspect ATN  Metabolic acidosis -Creatinine 4.69> 1.41> 1.63> 1.80 - Last creatinine in 2022 was 0.84 and GFR was greater than 60 - Bicarb infusion initiated on 5/10 - Bicarb 15> 19> 17>12> 18> 26 - She has a Foley catheter which is draining clear urine-she has had 700 cc output already today  Hypokalemia -As a result of poor oral intake, loose stools and bicarb infusion - Potassium was 3.5 on 5/11 and 2.8 this morning-magnesium level pending - Replaced via IV with 60 meq  Loose stools - A rectal pouch was placed on 5/10 for type 6 "mushy" stool - On my exam on 5/11, the bag was empty  and clean-today there is a very small amount of brownish soft stool-I have ordered stool studies  Normocytic anemia, thrombocytopenia - Hemoglobin 9.5> 8.8> 7.5> 8.4 -Plt 192> 147> 111> 142 - Suspect some of this is dilutional-some is secondary to acute illness  Hypotension with a history of essential hypertension - Lisinopril  has been on hold- She received 1 dose of nifedipine  on 5/9 - IV metoprolol  ordered on 5/10 with recommendations to hold if systolic blood pressures less than 90   DM type 2 with diabetic peripheral neuropathy, retinopathy - Janumet, NovoLog  sliding scale and Tresiba - glucose elevated in setting of acute infection - She has not been eating due to being unresponsive - Steadily increasing her insulin  each day including today   History of cardioembolic cerebrovascular accident (CVA) - Aggrenox  and Zocor  resumed but she is not taking anything by mouth  Major depression & Chronic, 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   Weight as of this encounter: 64 kg.   Bed bound     Code Status: Limited: Do not attempt resuscitation (DNR) -DNR-LIMITED -Do Not Intubate/DNI  Total time on patient care: 35 min DVT prophylaxis:  Place and maintain sequential compression device Start: 02/19/24 1150   Objective:   Vitals:   02/22/24 0915 02/22/24 0930 02/22/24 0945 02/22/24 1000  BP:    121/70  Pulse: 84 87 86 84  Resp: 17 (!) 27 (!) 23 (!) 22  Temp: 99.1 F (37.3 C) 99.1 F (37.3 C) 99.1 F (37.3 C) 99.1 F (37.3 C)  TempSrc:      SpO2: 95% 92% 93% 95%  Weight:      Height:       Filed Weights   02/18/24 2215 02/20/24 1200  Weight: 64.5 kg 64 kg   Exam: General exam: Appears comfortable  HEENT: oral mucosa moist Respiratory system: Clear to auscultation.  Cardiovascular system: S1 & S2 heard Neuro: Opens eyes when being called but otherwise will not follow commands or move her extremities-pupils are equal round and reacting to light Gastrointestinal system: Abdomen soft, non-tender, nondistended. Normal bowel sounds-rectal pouch  in place with a very tiny amount of soft stool in the bag Extremities: No cyanosis, clubbing or edema Psychiatry: Unable to assess    CBC: Recent Labs  Lab 02/18/24 1710 02/19/24 0816 02/21/24 0310 02/22/24 0315  WBC 6.9 6.4 10.5 11.2*  NEUTROABS 6.3  --   --   --   HGB 9.5* 8.8* 7.5* 8.4*  HCT 32.1* 32.4* 26.8* 29.6*  MCV 89.2 94.5 94.0 90.8  PLT 192 147* 111* 142*   Basic Metabolic Panel: Recent Labs  Lab 02/19/24 0353 02/20/24 0409 02/20/24 1628 02/21/24 0310 02/22/24 0315  NA 141 138 139 138 136  K 3.8 3.9 3.6 3.5 2.8*  CL 111 112* 113* 108 96*  CO2 19* 17* 12* 18* 26  GLUCOSE 154* 398* 298* 395* 348*  BUN 34* 32* 31* 34* 32*  CREATININE 1.42* 1.41* 1.63* 1.63* 1.80*  CALCIUM 8.5* 8.8* 8.0* 8.0* 7.9*  MG  --   --  1.4*  --   --      Scheduled Meds:  Chlorhexidine Gluconate  Cloth  6 each Topical Daily   dipyridamole -aspirin   1 capsule Oral BID   feeding supplement  237 mL Oral BID BM   ferrous sulfate   325 mg Oral Q breakfast   insulin  aspart  0-5 Units Subcutaneous QHS   insulin  aspart  0-9 Units Subcutaneous TID WC   insulin  glargine-yfgn  15 Units Subcutaneous BID   memantine  10 mg Oral BID   metoprolol tartrate  2.5 mg Intravenous Q6H   NIFEdipine   30 mg Oral Daily   nystatin    Topical TID   mouth rinse  15 mL Mouth Rinse 4 times per day   senna-docusate  2 tablet Oral QHS   simvastatin   10 mg Oral QHS    Imaging and lab data personally reviewed   Author: Trashaun Streight  02/22/2024 11:32 AM  To contact [facility]>   Check the care team in [facility] and look for the attending/consulting [facility] provider listed  Log into [email or web address] and use [place]'s universal password   Go to> "[facility]"  and find provider  If you still have difficulty reaching the provider, please page the [facility] (Director on Call) for the Hospitalists listed on amion

## 2024-02-22 NOTE — Consult Note (Signed)
 Consultation Note Date: 02/22/2024   Patient Name: Yvette Gutierrez  DOB: December 28, 1945  MRN: [id number]  Age / Sex: 78 y.o., female  PCP: Patient, No Pcp Per Referring Physician: Sedalia Dacosta, MD  Reason for Consultation: Establishing goals of care  HPI/Patient Profile: 78 y.o. female  with past medical history of advanced dementia, DM2, hypertension, hyperlipidemia, CVA, depression, seizure disorder, anemia, bedbound admitted on 02/18/2024 with seizure and lethargy.   During ED workup, patient was also noted to have UTI and sepsis.  Per neurology, breakthrough seizure is likely due to infectious process lowering seizure threshold, compounded by seizure medications not received for around 24 hours while inpatient.   PMT has been consulted to assist with goals of care conversation.  Clinical Assessment and Goals of Care:  I have reviewed medical records including EPIC notes, labs and imaging, assessed the patient and then had a phone conversation with patient's son/POA Tom to discuss diagnosis prognosis, GOC, EOL wishes, disposition and options.  I introduced Palliative Medicine as specialized medical care for people living with serious illness. It focuses on providing relief from the symptoms and stress of a serious illness. The goal is to improve quality of life for both the patient and the family.  We discussed a brief life review of the patient and then focused on their current illness.  The natural disease trajectory and expectations at EOL were discussed.  I attempted to elicit values and goals of care important to the patient.    Medical History Review and Understanding:  We discussed patient's acute illness in the context of her chronic comorbidities.  Patient's son Hinton Luis has a good understanding of the progressive and irreversible nature of her underlying dementia.  Provided education on the risk of recurrent hospitalizations due to infections  and complications of progressive disease.  Social History: Patient is widowed. Patient has been a long-term care facility resident in [place] for the past 10 years. She has several sons and a daughter who historically have not agreed about the best care plan for her. She is described as a shy and peaceful woman.  Functional and Nutritional State: She has been worsening more rapidly over the past couple of years, now bedbound/wheelchair-bound for the past several months and with cognitive decline. She barely eats on her own and is sometimes forced to eat by hopeful family members who desire improvement.  Palliative Symptoms: Patient unable to confirm or deny any pain or distress Son reports likely chronic pain in shoulder/hip at baseline  Advance Directives: A detailed discussion regarding advanced directives was had.  No documentation currently on file.  Hinton Luis reports he is HCPOA.  Code Status: Concepts specific to code status, artifical feeding and hydration, and rehospitalization were considered and discussed. DNR/DNI confirmed. No feeding tube.  Discussion: Patient's son is reasonable and realistic about patient's poor long-term prognosis. He shares that he was selected as POA because his parents knew he would "do what needed to be done" and would not let emotions impact his ability to honor their wishes and make the right decisions.  There was great conflict at the time of his father's death amongst the adult children.  Hinton Luis is a Education officer, environmental and feels hospice is a gift from God, but his brothers and sister do not view it this way.  They have currently been debating Atalia's wishes and his siblings are emotional about the path forward. On most days, she is fairly happy considering the constraints she is faced with.  She does still  engage in conversations with family occasionally. Tom's brother came to the hospital to feed the patient and "prove them wrong" when faced with her dysphagia and poor oral  intake. Hinton Luis feels that his siblings are certainly not ready for hospice and he is trying to navigate what he feels it best for her while not harming their relationships further. Emotional support and therapeutic listening was provided.  Discussed patient's prognosis of months to years and provided my recommendation for hospice.  Patient herself has made decisions against surgical interventions for hip and shoulder fractures.  He feels she would not want life-prolonging care such as feeding tubes, intubation, CPR.  I offered to assist with coordination of family meeting to include patient's adult children and support shared medical decision making, anticipatory care needs, understanding of patient's illness.  He is appreciative and will discuss further with them to determined if interested.  He feels that an outpatient palliative care follow-up would be very helpful at the least, though his brother strongly disagreed with PMT involvement when this was mentioned to family.     Hospice and Palliative Care services outpatient were explained and offered.   Discussed the importance of continued conversation with family and the medical providers regarding overall plan of care and treatment options, ensuring decisions are within the context of the patient's values and GOCs.   Questions and concerns were addressed. The family was encouraged to call with questions or concerns.  PMT will continue to support holistically.   SUMMARY OF RECOMMENDATIONS   - Continue DNR/DNI -Continue current care plan -Patient's son/POA will discuss further with his siblings and determine if interested in a family meeting during this admission -Ongoing goals of care discussions -Psychosocial and emotional support provided -PMT will continue to follow and support   Prognosis:  Months to a year would not be surprising   Discharge Planning: To Be Determined      Primary Diagnoses: Present on Admission:  Essential  hypertension  Major depression, chronic  Neuropathy due to secondary diabetes (HCC)  Hyperlipidemia LDL goal <70  Diabetic retinopathy (HCC)  OSA (obstructive sleep apnea)  DM type 2 with diabetic peripheral neuropathy (HCC)  Vascular dementia without behavioral disturbance (HCC)  CKD (chronic kidney disease) stage 3, GFR 30-59 ml/min (HCC)    Physical Exam Vitals and nursing note reviewed.  Constitutional:      General: She is not in acute distress.    Appearance: She is ill-appearing.     Interventions: She is restrained.  Cardiovascular:     Rate and Rhythm: Normal rate.  Pulmonary:     Effort: Pulmonary effort is normal.  Skin:    General: Skin is warm and dry.  Neurological:     Mental Status: She is alert.  Psychiatric:        Cognition and Memory: Cognition is impaired.     Vital Signs: BP (!) 123/59 (BP Location: Left Arm)   Pulse 86   Temp 99.1 F (37.3 C)   Resp (!) 21   Ht 5' (1.524 m)   Wt 64 kg   SpO2 93%   BMI 27.56 kg/m  Pain Scale: PAINAD POSS *See Group Information*: 1-Acceptable,Awake and alert Pain Score: 2    SpO2: SpO2: 93 % O2 Device:SpO2: 93 % O2 Flow Rate: .O2 Flow Rate (L/min): 2 L/min   Palliative Assessment/Data: 20%     MDM: high   Vishaal Strollo Alroy Jericho, PA-C  Palliative Medicine Team Team phone # [phone]  Thank you for allowing  the Palliative Medicine Team to assist in the care of this patient. Please utilize secure chat with additional questions, if there is no response within 30 minutes please call the above phone number.  Palliative Medicine Team providers are available by phone from 7am to 7pm daily and can be reached through the team cell phone.  Should this patient require assistance outside of these hours, please call the patient's attending physician.

## 2024-02-22 NOTE — Progress Notes (Signed)
 [facility] Progress Note  Patient: Yvette Gutierrez     GNF:[id number]  DOA: 02/18/2024   PCP: Patient, No Pcp Per       Brief hospital course: This is a 78 year old female with dementia, diabetes, hypertension, history of CVA, hyperlipidemia, depression who is bedbound and resides in a nursing facility (for the past 10 years).  She sent to the hospital for seizure-like activity followed by lethargy.  She appeared to have a generalized/tonic-clonic seizure subsequently was lethargic and sent to the ED.  Per history, she had a seizure last week as well.  The patient takes Lacosamide .   In the ED: Fever 102.7, heart rate 110, lactic acid 3.7 followed by 1.7. Serum bicarbonate 15 BUN 39 and creatinine 1.6. Further workup revealed UA showing large leukocytes and many bacteria with a small amount of hemoglobin and negative nitrite. Patient continued to be somnolent in the ED. DNR/DNI   5/10:  The patient was found this morning by the RN and myself with eyes closed, heavy breathing and shaking of her body (similar to rigors) with frothing at the mouth.  Temperature was 103 degrees.  Last temp checked at 4 AM was 98.3. She was given a dose of IV Ativan  1mg , 15 mg of IV Toradol and subsequently was able to respond to the pain but continued to have shaking and heavy breathing.  Continued to have a temperature of 103.  Started on Zyvox, Zosyn, IV fluid bolus, and then bolused with IV Keppra and Lamictal.-I evaluated her 2 more times throughout the day.  Goals of care discussion: DNR-DNI-no feeding tubes-okay to have IV interventions and noninvasive ventilation  5/11: Evaluated this morning.  Says "hmm" when her name is spoken.  Attempts to open her eyes when asked to but then squeezes them shut when manually attempted.  Does not open her mouth and has not taken any medications.  5/12: Opens her eyes but does not follow commands or open her mouth.  Assessment and Plan: Principal  Problem: Breakthrough seizure in the setting of UTI - Lacosamide  level ordered on 5/8 finally resulted in the afternoon on 5/11-level was 6.1 with a range being between 5 and 10 mcg/mL -5/10-Rocephin  changed to Zyvox and Zosyn- IV Keppra and Lacosamide  ordered by neurology-patient did not receive her evening dose of Lacosamide  on 5/9 - The patient was transferred to the stepdown unit -5/11-no fevers or seizure like activity since yesterday-urine culture still has not been finalized-continue Zyvox, Zosyn, Keppra, lacosamide  - 5/12-Tmax 100.2 last night at 2101-urine culture reveals Klebsiella pneumonia resistant to ampicillin and nitrofurantoin-has been switched to Ancef -no seizure-like activity noted since 5/10  Active Problems:  Lactic acidosis - Lactic acid 3.7-has improved to 1.7 after IV fluids - Continue IV fluids-500 cc bolus followed by 125 cc an hour of normal saline with  AKI-suspect ATN  Metabolic acidosis -Creatinine 8.46> 1.41> 1.63> 1.80 - Last creatinine in 2022 was 0.84 and GFR was greater than 60 - Bicarb infusion initiated on 5/10 - Bicarb 15> 19> 17>12> 18> 26 - She has a Foley catheter which is draining clear urine-she has had 700 cc output already today  Hypokalemia -As a result of poor oral intake, loose stools and bicarb infusion - Potassium was 3.5 on 5/11 and 2.8 this morning-magnesium level pending - Replaced via IV with 60 meq  Loose stools - A rectal pouch was placed on 5/10 for type 6 "mushy" stool - On my exam on 5/11, the bag was empty and clean-today there  is a very small amount of brownish soft stool-I have ordered stool studies  Normocytic anemia, thrombocytopenia - Hemoglobin 9.5> 8.8> 7.5> 8.4 -Plt 192> 147> 111> 142 - Suspect some of this is dilutional-some is secondary to acute illness  Hypotension with a history of essential hypertension - Lisinopril  has been on hold- She received 1 dose of nifedipine  on 5/9 - IV metoprolol ordered on 5/10  with recommendations to hold if systolic blood pressures less than 90   DM type 2 with diabetic peripheral neuropathy, retinopathy - Janumet, NovoLog  sliding scale and Tresiba - glucose elevated in setting of acute infection - She has not been eating due to being unresponsive - Steadily increasing her insulin  each day including today   History of cardioembolic cerebrovascular accident (CVA) - Aggrenox  and Zocor  resumed but she is not taking anything by mouth  Major depression & Chronic, 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   Weight as of this encounter: 64 kg.   Bed bound  Disposition: Spoke with Alyn Judge, her son, [place], who does not feel she would want a feeding tube.  He is open to having further palliative care discussions about her.    Code Status: Limited: Do not attempt resuscitation (DNR) -DNR-LIMITED -Do Not Intubate/DNI  Total time on patient care: 35 min DVT prophylaxis:  Place and maintain sequential compression device Start: 02/19/24 1150   Objective:   Vitals:   02/22/24 0915 02/22/24 0930 02/22/24 0945 02/22/24 1000  BP:    121/70  Pulse: 84 87 86 84  Resp: 17 (!) 27 (!) 23 (!) 22  Temp: 99.1 F (37.3 C) 99.1 F (37.3 C) 99.1 F (37.3 C) 99.1 F (37.3 C)  TempSrc:      SpO2: 95% 92% 93% 95%  Weight:      Height:       Filed Weights   02/18/24 2215 02/20/24 1200  Weight: 64.5 kg 64 kg   Exam: General exam: Appears comfortable  HEENT: oral mucosa moist Respiratory system: Clear to auscultation.  Cardiovascular system: S1 & S2 heard Neuro: Opens eyes when being called but otherwise will not follow commands or move her  extremities-pupils are equal round and reacting to light Gastrointestinal system: Abdomen soft, non-tender, nondistended. Normal bowel sounds-rectal pouch in place with a very tiny amount of soft stool in the bag Extremities: No cyanosis, clubbing or edema Psychiatry: Unable to assess    CBC: Recent Labs  Lab 02/18/24 1710 02/19/24 0816 02/21/24 0310 02/22/24 0315  WBC 6.9 6.4 10.5 11.2*  NEUTROABS 6.3  --   --   --   HGB 9.5* 8.8* 7.5* 8.4*  HCT 32.1* 32.4* 26.8* 29.6*  MCV 89.2 94.5 94.0 90.8  PLT 192 147* 111* 142*   Basic Metabolic Panel: Recent Labs  Lab 02/19/24 0353 02/20/24 0409 02/20/24 1628 02/21/24 0310 02/22/24 0315  NA 141 138 139 138 136  K 3.8 3.9 3.6 3.5 2.8*  CL 111 112* 113* 108 96*  CO2 19* 17* 12* 18* 26  GLUCOSE 154* 398* 298* 395* 348*  BUN 34* 32* 31* 34* 32*  CREATININE 1.42* 1.41* 1.63* 1.63* 1.80*  CALCIUM 8.5* 8.8*  8.0* 8.0* 7.9*  MG  --   --  1.4*  --   --      Scheduled Meds:  Chlorhexidine Gluconate Cloth  6 each Topical Daily   dipyridamole -aspirin   1 capsule Oral BID   feeding supplement  237 mL Oral BID BM   ferrous sulfate   325 mg Oral Q breakfast   insulin  aspart  0-5 Units Subcutaneous QHS   insulin  aspart  0-9 Units Subcutaneous TID WC   insulin  glargine-yfgn  15 Units Subcutaneous BID   memantine  10 mg Oral BID   metoprolol tartrate  2.5 mg Intravenous Q6H   NIFEdipine   30 mg Oral Daily   nystatin    Topical TID   mouth rinse  15 mL Mouth Rinse 4 times per day   senna-docusate  2 tablet Oral QHS   simvastatin   10 mg Oral QHS    Imaging and lab data personally reviewed   Author: Bailey Faiella  02/22/2024 11:32 AM  To contact [facility]>   Check the care team in [facility] and look for the attending/consulting [facility] provider listed  Log into [email or web address] and use [place]'s universal password   Go to> "[facility]"  and find provider  If you still have difficulty reaching the provider, please page the [facility]  (Director on Call) for the Hospitalists listed on amion

## 2024-02-22 NOTE — Plan of Care (Signed)
  Problem: Elimination: Goal: Will not experience complications related to bowel motility Outcome: Progressing   Problem: Clinical Measurements: Goal: Respiratory complications will improve Outcome: Not Progressing   Problem: Coping: Goal: Level of anxiety will decrease Outcome: Not Progressing   Problem: Pain Managment: Goal: General experience of comfort will improve and/or be controlled Outcome: Not Progressing

## 2024-02-23 ENCOUNTER — Inpatient Hospital Stay (HOSPITAL_COMMUNITY)

## 2024-02-23 DIAGNOSIS — G40909 Epilepsy, unspecified, not intractable, without status epilepticus: Secondary | ICD-10-CM | POA: Diagnosis not present

## 2024-02-23 DIAGNOSIS — Z7189 Other specified counseling: Secondary | ICD-10-CM | POA: Diagnosis not present

## 2024-02-23 DIAGNOSIS — J9601 Acute respiratory failure with hypoxia: Secondary | ICD-10-CM | POA: Diagnosis not present

## 2024-02-23 LAB — BLOOD CULTURE ID PANEL (REFLEXED) - BCID2

## 2024-02-23 LAB — GASTROINTESTINAL PANEL BY PCR, STOOL (REPLACES STOOL CULTURE)

## 2024-02-23 LAB — MAGNESIUM: Magnesium: 1.8 mg/dL (ref 1.7–2.4)

## 2024-02-23 LAB — LACTOFERRIN, FECAL, QUALITATIVE: Lactoferrin, Fecal, Qual: POSITIVE — AB

## 2024-02-23 LAB — CBC
HCT: 32.3 % — ABNORMAL LOW (ref 36.0–46.0)
Hemoglobin: 9.4 g/dL — ABNORMAL LOW (ref 12.0–15.0)
MCH: 26 pg (ref 26.0–34.0)
MCHC: 29.1 g/dL — ABNORMAL LOW (ref 30.0–36.0)
MCV: 89.2 fL (ref 80.0–100.0)
Platelets: 144 10*3/uL — ABNORMAL LOW (ref 150–400)
RBC: 3.62 MIL/uL — ABNORMAL LOW (ref 3.87–5.11)
RDW: 15.7 % — ABNORMAL HIGH (ref 11.5–15.5)
WBC: 10.3 10*3/uL (ref 4.0–10.5)
nRBC: 0 % (ref 0.0–0.2)

## 2024-02-23 LAB — BASIC METABOLIC PANEL WITH GFR
Anion gap: 9 (ref 5–15)
BUN: 22 mg/dL (ref 8–23)
CO2: 31 mmol/L (ref 22–32)
Calcium: 7.6 mg/dL — ABNORMAL LOW (ref 8.9–10.3)
Chloride: 98 mmol/L (ref 98–111)
Creatinine, Ser: 1.2 mg/dL — ABNORMAL HIGH (ref 0.44–1.00)
GFR, Estimated: 46 mL/min — ABNORMAL LOW (ref >60)
Glucose, Bld: 74 mg/dL (ref 70–99)
Potassium: 3 mmol/L — ABNORMAL LOW (ref 3.5–5.1)
Sodium: 138 mmol/L (ref 135–145)

## 2024-02-23 LAB — GLUCOSE, CAPILLARY
Glucose-Capillary: 108 mg/dL — ABNORMAL HIGH (ref 70–99)
Glucose-Capillary: 137 mg/dL — ABNORMAL HIGH (ref 70–99)
Glucose-Capillary: 163 mg/dL — ABNORMAL HIGH (ref 70–99)
Glucose-Capillary: 64 mg/dL — ABNORMAL LOW (ref 70–99)
Glucose-Capillary: 75 mg/dL (ref 70–99)
Glucose-Capillary: 79 mg/dL (ref 70–99)
Glucose-Capillary: 79 mg/dL (ref 70–99)
Glucose-Capillary: 88 mg/dL (ref 70–99)
Glucose-Capillary: 98 mg/dL (ref 70–99)

## 2024-02-23 LAB — CULTURE, BLOOD (ROUTINE X 2): Culture: NO GROWTH

## 2024-02-23 MED ORDER — POTASSIUM CHLORIDE 10 MEQ/100ML IV SOLN: 10.0000 meq | INTRAVENOUS | Status: DC

## 2024-02-23 MED ORDER — FUROSEMIDE 10 MG/ML IJ SOLN
40.0000 mg | Freq: Once | INTRAMUSCULAR | Status: AC
  Administered 2024-02-23: 40 mg via INTRAVENOUS
  Filled 2024-02-23: qty 4

## 2024-02-23 MED ORDER — FUROSEMIDE 10 MG/ML IJ SOLN
40.0000 mg | Freq: Two times a day (BID) | INTRAMUSCULAR | Status: DC
  Administered 2024-02-23 – 2024-02-26 (×6): 40 mg via INTRAVENOUS
  Filled 2024-02-23 (×7): qty 4

## 2024-02-23 MED ORDER — INSULIN GLARGINE-YFGN 100 UNIT/ML ~~LOC~~ SOLN
10.0000 [IU] | Freq: Two times a day (BID) | SUBCUTANEOUS | Status: DC
  Filled 2024-02-23: qty 0.1

## 2024-02-23 MED ORDER — SODIUM CHLORIDE (PF) 0.9 % IJ SOLN
INTRAMUSCULAR | Status: AC
  Filled 2024-02-23: qty 50

## 2024-02-23 MED ORDER — SODIUM CHLORIDE 0.9 % IV SOLN
2.0000 g | Freq: Every day | INTRAVENOUS | Status: AC
  Administered 2024-02-23 – 2024-02-26 (×4): 2 g via INTRAVENOUS
  Filled 2024-02-23 (×4): qty 20

## 2024-02-23 MED ORDER — POTASSIUM CHLORIDE 10 MEQ/100ML IV SOLN
10.0000 meq | INTRAVENOUS | Status: AC
Start: 2024-02-23 — End: 2024-02-23
  Administered 2024-02-23 (×4): 10 meq via INTRAVENOUS
  Filled 2024-02-23 (×4): qty 100

## 2024-02-23 MED ORDER — IOHEXOL 350 MG/ML SOLN
80.0000 mL | Freq: Once | INTRAVENOUS | Status: AC | PRN
  Administered 2024-02-23: 80 mL via INTRAVENOUS

## 2024-02-23 MED ORDER — POTASSIUM CHLORIDE 10 MEQ/100ML IV SOLN
10.0000 meq | INTRAVENOUS | Status: AC
Start: 2024-02-23 — End: 2024-02-23
  Administered 2024-02-23 (×2): 10 meq via INTRAVENOUS
  Filled 2024-02-23: qty 100

## 2024-02-23 MED ORDER — OXYCODONE HCL 5 MG PO TABS: 5.0000 mg | ORAL_TABLET | Freq: Four times a day (QID) | ORAL | Status: DC | PRN

## 2024-02-23 MED ORDER — DEXTROSE 50 % IV SOLN
12.5000 g | INTRAVENOUS | Status: AC
  Administered 2024-02-23: 12.5 g via INTRAVENOUS

## 2024-02-23 NOTE — Progress Notes (Addendum)
 Daily Progress Note   Patient Name: Yvette Gutierrez       Date: 02/23/2024 DOB: [birth year]  Age: 78 y.o. MRN#: [id number] Attending Physician: Rizwan, Saima, MD Primary Care Physician: Patient, No Pcp Per Admit Date: 02/18/2024  Reason for Consultation/Follow-up: Establishing goals of care  Patient Profile/HPI: 78 y.o. female  with past medical history of advanced dementia, DM2, hypertension, hyperlipidemia, CVA, depression, seizure disorder, anemia, bedbound admitted on 02/18/2024 with seizure and lethargy.    During ED workup, patient was also noted to have UTI and sepsis.  Per neurology, breakthrough seizure is likely due to infectious process lowering seizure threshold, compounded by seizure medications not received for around 24 hours while inpatient.    PMT has been consulted to assist with goals of care conversation.  Subjective: Chart reviewed including labs, progress notes, imaging from this and previous encounters.  Spoke with patient's RN Carolyn Cisco and attending provider- patient status worsening today with increased oxygen needs. Mental status is worse with periods of unresponsiveness.   Labs reviewed- hypokalemia at 3.0 - receiving Kruns.  Cr. Trending down- 1.20 today- 1.26 yesterday.  Blood culture ID growing Klebsiella. Chest xray image reviewed - concerning for R consolidation, L pleural effusion, CT angio ordered by Dr. Renie Carver pending.  On eval- patient somnolent.  Spoke with patient's son Hinton Luis. Discussed results and patient worsening status. Recommended repeat meeting with all of siblings involved. Tom shared some complicated family dynamics. Agreed to attempt family meeting.   Review of Systems  Unable to perform ROS: Mental status change     Physical Exam Vitals and nursing  note reviewed.  Constitutional:      General: She is not in acute distress.    Appearance: She is ill-appearing.  Cardiovascular:     Rate and Rhythm: Normal rate.  Neurological:     Comments: somnolent             Vital Signs: BP (!) 169/68   Pulse 94   Temp (!) 96.9 F (36.1 C) (Axillary) Comment: blanket placed on pt Comment (Src): pt refusing oral temp  Resp 19   Ht 5' (1.524 m)   Wt 64 kg   SpO2 97%   BMI 27.56 kg/m  SpO2: SpO2: 97 % O2 Device: O2 Device: Nasal Cannula O2 Flow Rate: O2 Flow Rate (L/min):  6 L/min  Intake/output summary:  Intake/Output Summary (Last 24 hours) at 02/23/2024 1317 Last data filed at 02/23/2024 1200 Gross per 24 hour  Intake 3313.48 ml  Output 1100 ml  Net 2213.48 ml   LBM: Last BM Date : 02/22/24 Baseline Weight: Weight: 64.5 kg Most recent weight: Weight: 64 kg       Palliative Assessment/Data: PPS: 20%      Patient Active Problem List   Diagnosis Date Noted   Acute respiratory failure with hypoxia (HCC) 02/23/2024   Bedbound 02/19/2024   Seizure (HCC) 02/18/2024   Femur fracture (HCC) 06/15/2021   History of cardioembolic cerebrovascular accident (CVA) 12/31/2016   Vascular dementia without behavioral disturbance (HCC) 12/31/2016   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   CKD (chronic kidney disease) stage 3, GFR 30-59 ml/min (HCC) 08/26/2016   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 disease, with long-term current use of insulin  (HCC) 10/29/2015   Essential hypertension, benign 10/29/2015   DM type 2 with diabetic peripheral neuropathy (HCC) 02/14/2015   Major depressive disorder, recurrent episode, mild (HCC) 02/14/2015   Umbilical hernia without obstruction and without gangrene 02/14/2015   Primary osteoarthritis of both shoulders 02/14/2015   Slow transit constipation 02/14/2015   Seizure disorder (HCC) 02/14/2015   History of fall 21-Jul-2014   Death of family member 2014/02/23    Rash and nonspecific skin eruption 05/04/2013   OSA (obstructive sleep apnea) 04/06/2013   Abnormality of gait 04/05/2013   Diabetic retinopathy (HCC) 06/22/2012   Hyperlipidemia LDL goal <70 04/02/2012   Cervical cancer screening 05/28/2011   Colon cancer screening 05/28/2011   Allergic rhinitis    Neuropathy due to secondary diabetes (HCC)    Cerebral infarction (HCC)    Bilateral shoulder pain    Backache 07/09/2010   Vitamin D  deficiency 10/31/2009   DEPENDENT EDEMA, LEGS, BILATERAL 10/31/2009   OBESITY, MORBID 05/14/2007   ONYCHOMYCOSIS 04/02/2007   DIABETES MELLITUS, TYPE II 03/31/2007   Major depression, chronic 03/31/2007   Essential hypertension 03/31/2007    Palliative Care Assessment & Plan    Assessment/Recommendations/Plan  Continue current interventions- pt status worsening- family meeting requested Patient's son to call back with time to meet tomorrow   Code Status:   Code Status: Limited: Do not attempt resuscitation (DNR) -DNR-LIMITED -Do Not Intubate/DNI    Prognosis:  Unable to determine  Discharge Planning: To Be Determined  Care plan was discussed with Dr. Rizwan, Jamie, RN, patient's son Hinton Luis  Thank you for allowing the Palliative Medicine Team to assist in the care of this patient.  Total time:  60 minutes Prolonged billing:  Time includes:   Preparing to see the patient (e.g., review of tests) Obtaining and/or reviewing separately obtained history Performing a medically necessary appropriate examination and/or evaluation Counseling and educating the patient/family/caregiver Ordering medications, tests, or procedures Referring and communicating with other health care professionals (when not reported separately) Documenting clinical information in the electronic or other health record Independently interpreting results (not reported separately) and communicating results to the patient/family/caregiver Care coordination (not reported  separately) Clinical documentation  Micki Alas, AGNP-C Palliative Medicine   Please contact Palliative Medicine Team phone at [phone] for questions and concerns.       Aaron Aas

## 2024-02-23 NOTE — Progress Notes (Signed)
 Hypoglycemic Event  CBG: 64   Treatment: D50 25 mL (12.5 gm)  Symptoms: None  Follow-up CBG: Time:0429 CBG Result:108   Possible Reasons for Event: Inadequate meal intake  Comments/MD notified:James Sharion Davidson, NP notified of event    Teddi Favors, RN

## 2024-02-23 NOTE — Progress Notes (Addendum)
 [facility] Progress Note  Patient: Yvette Gutierrez     ZOX:[id number]  DOA: 02/18/2024   PCP: Patient, No Pcp Per       Brief hospital course: This is a 78 year old female with dementia, diabetes, hypertension, history of CVA, hyperlipidemia, depression who is bedbound and resides in a nursing facility (for the past 10 years).  She sent to the hospital for seizure-like activity followed by lethargy.  She appeared to have a generalized/tonic-clonic seizure subsequently was lethargic and sent to the ED.  Per history, she had a seizure last week as well.  The patient takes Lacosamide .  Per Hinton Luis, her son and POA, the patient has been eating poorly and has been losing weight at the nursing facility.  In the ED:  Fever 102.7, heart rate 110 Somnolent Lactic acid 3.7  Serum bicarbonate 15 BUN 39 and creatinine 1.6 Further workup revealed UA showing large leukocytes and many bacteria with a small amount of hemoglobin and negative nitrite. Started on ceftriaxone  and given aggressive fluids  DNR/DNI/no feeding tubes   5/10:  The patient was found this morning by the RN and myself with eyes closed, heavy breathing and shaking of her body (similar to rigors) with frothing at the mouth.  Temperature was 103 degrees.  Last temp checked at 4 AM was 98.3. She was given a dose of IV Ativan  1mg , 15 mg of IV Toradol and subsequently was able to respond to pain but continued to have shaking and heavy breathing.  Continued to have a temperature of 103.  Started on Zyvox, Zosyn, IV fluid bolus, and then bolused with IV Keppra and Lamictal.-I evaluated her 2 more times throughout the day. 5/10:  Afternoon-she was awake and singing with her daughter Lynn Sark 5/11:  Evaluated this morning.  Says "hmm" when her name is spoken.  Attempts to open her eyes when asked to but then squeezes them shut when manually attempted.  Does not open her mouth and has not taken any medications. She was awake by the evening  around 5 PM and was talking with her son, Donavon Fudge who was able to feed her.  Donavon Fudge called Tom who heard her speaking over the phone. 5/12:  Opens her eyes but does not follow commands or open her mouth.  Changed back to n.p.o towards the evening.  Her son, Donavon Fudge visited her again and according to her nurse, Jamie, the patient was not opening her mouth for him.   U culture K pneumonia Palliative care spoke with son Hinton Luis, [place]. 5/13:  Daughter at bedside this morning Per RN, the patient had sudden onset of abdominal breathing, hypoxia with pulse ox 73% on 3 L-turned up to 6 L-CBG 79-blood culture, 1 set aerobic bottle with gram-negative rods  I strongly feel that a family meeting needs to be had with palliative care. I have communicated this with Micki Alas, palliative care.  Assessment and Plan: Principal Problem: Breakthrough seizure in the setting of UTI - started on CTX - Lacosamide  level ordered on 5/8 finally resulted in the afternoon on 5/11-level was 6.1 with a range being between 5 and 10 mcg/mL -5/10-fever 103.1, seizures, CXT changed to Zyvox and Zosyn - IV Keppra (bolus only) and Lacosamide  ordered by neurology-patient did not receive her evening dose of Lacosamide  on 5/9 - The patient was transferred to the stepdown unit -5/11-no fevers or seizure like activity since yesterday-urine culture still has not been finalized-continue Zyvox, Zosyn, Keppra, lacosamide  - 5/12-Tmax 100.2 last night at 2101-urine culture  reveals Klebsiella pneumonia resistant to ampicillin and nitrofurantoin-has been switched from Zyvox and Zosyn to Ancef  based on pharmacy recommendations-no seizure-like activity noted since 5/10 -5/13- gram neg rods in aerobic bottle only-BCID reveals Klebsiella pneumonia-pharmacy recommends switching Ancef  to Ceftriaxone , continue Lacosamide  100 BID IV   Active Problems:  5/13 Acute respiratory distress & acute hypoxic respiratory failure - Pulse ox 73% on 3 L of  oxygen -Sudden in onset this morning - Chest x-ray, portable reveals right middle lobe consolidation versus atelectasis and possible left lower lobe consolidation versus atelectasis - CT of the chest with and without contrast ordered stat - CT showing fluid overload and effusions- order Lasix  IV and stop IVF   Lactic acidosis - 5/8 lactic acid 3.7-2 L normal saline followed by normal saline at 75 cc an hour -5/9 improved to 1.7 after IV fluids-not checked after that but in continued to receive IV fluids   AKI-suspect ATN  Metabolic acidosis -Creatinine 1.61> 1.41> 1.63> 1.80> 1.20 - Last creatinine in 2022 was 0.84 and GFR was greater than 60 - Bicarb infusion given until Bicarb improved to normal-at this point switch to normal saline for maintenance as she had 0 oral intake - She has a Foley catheter which is draining clear urine  - Due to respiratory distress, IV Lasix  40 mg 1026 today. 225 output thus far  Hypokalemia -As a result of poor oral intake, loose stools and bicarb infusion (now stopped) - Replacing - Magnesium normal  Loose stools - A rectal pouch was placed on 5/10 for type 6 "mushy" stool - lactoferrin positive, GI pathogen panel negative - did not check for C. difficile  Normocytic anemia, thrombocytopenia - Hemoglobin 9.5> 8.8> 7.5> 8.4> 9.4 -Plt 192> 147> 111> 142> 144 - Suspect some of this is dilutional-some is secondary to acute illness  Hypotension with a history of essential hypertension - Lisinopril  has been on hold- She received 1 dose of nifedipine  on 5/9 - IV metoprolol ordered on 5/10 with recommendations to hold if systolic blood pressures less than 90   DM type 2 with diabetic peripheral neuropathy, retinopathy - Janumet, NovoLog  sliding scale and Tresiba - glucose elevated in setting of acute infection and subsequently dropped to 64 this morning - She has not been eating due to being unresponsive - adjusting insulin     History of cardioembolic  cerebrovascular accident (CVA) - Aggrenox  and Zocor  resumed but she is not taking anything by mouth  Major depression & Chronic, 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 disturbances -Namenda resumed - On a normal day she remembers her son, Hinton Luis,  but does not recognize anyone else  Weight loss secondary to poor oral intake  Her son Hinton Luis, states that she has been eating poorly as outpatient and losing weight   Estimated body mass index is 27.56 kg/m as calculated from the following:   Height as of this encounter: 5' (1.524 m).   Weight as of this encounter: 64 kg.   Bed bound and resides at nursing facility     Code Status: Limited: Do not attempt resuscitation (DNR) -DNR-LIMITED -Do Not Intubate/DNI  Total time on patient care: 35 min DVT prophylaxis:  Place and maintain sequential compression device Start: 02/19/24 1150   Objective:   Vitals:   02/23/24 0423 02/23/24 0500 02/23/24 0600 02/23/24 0700  BP:  (!) 151/60 (!) 114/51 (!) 159/84  Pulse:  85 87 82  Resp:  (!) 21 (!) 21 20  Temp: (!) 97.4 F (36.3 C)  TempSrc: Oral     SpO2:  96% (!) 88% 96%  Weight:      Height:       Filed Weights   02/18/24 2215 02/20/24 1200  Weight: 64.5 kg 64 kg   Exam: General exam: Laying in bed with eyes closed, does not follow commands HEENT: Does not open eyes and actively squeezes them shut when I attempted to open them myself Respiratory system: Heavy abdominal breathing, distant breath sounds, tachypnea Cardiovascular system: S1 & S2 heard  Gastrointestinal system: Abdomen soft, non-tender, nondistended. Normal bowel sounds   Extremities: No cyanosis, clubbing or edema  Rectal bag with a small amount of liquid stool Foley catheter with pale yellow urine     CBC: Recent Labs  Lab 02/18/24 1710 02/19/24 0816 02/21/24 0310 02/22/24 0315 02/23/24 0304  WBC 6.9 6.4 10.5 11.2* 10.3  NEUTROABS 6.3  --   --   --   --   HGB 9.5* 8.8* 7.5* 8.4* 9.4*  HCT 32.1* 32.4* 26.8*  29.6* 32.3*  MCV 89.2 94.5 94.0 90.8 89.2  PLT 192 147* 111* 142* 144*   Basic Metabolic Panel: Recent Labs  Lab 02/20/24 1628 02/21/24 0310 02/22/24 0315 02/22/24 1247 02/23/24 0304  NA 139 138 136 140 138  K 3.6 3.5 2.8* 3.4* 3.0*  CL 113* 108 96* 97* 98  CO2 12* 18* 26 28 31   GLUCOSE 298* 395* 348* 275* 74  BUN 31* 34* 32* 26* 22  CREATININE 1.63* 1.63* 1.80* 1.26* 1.20*  CALCIUM 8.0* 8.0* 7.9* 8.1* 7.6*  MG 1.4*  --   --  2.2 1.8     Scheduled Meds:  Chlorhexidine Gluconate Cloth  6 each Topical Daily   insulin  aspart  0-9 Units Subcutaneous Q4H   insulin  glargine-yfgn  10 Units Subcutaneous BID   metoprolol tartrate  2.5 mg Intravenous Q6H   nystatin    Topical TID   mouth rinse  15 mL Mouth Rinse 4 times per day    Imaging and lab data personally reviewed   Author: Sedalia Dacosta  02/23/2024 7:39 AM  To contact [facility]>   Check the care team in [facility] and look for the attending/consulting [facility] provider listed  Log into [email or web address] and use 's universal password   Go to> "[facility]"  and find provider  If you still have difficulty reaching the provider, please page the [facility] (Director on Call) for the Hospitalists listed on amion

## 2024-02-23 NOTE — Progress Notes (Signed)
 PHARMACY - PHYSICIAN COMMUNICATION CRITICAL VALUE ALERT - BLOOD CULTURE IDENTIFICATION (BCID)  Yvette Gutierrez is an 78 y.o. female who presented to [facility] on 02/18/2024 with a chief complaint of seizure.   Assessment:  Blood cx now growing GNR; BCID + Klebsiella pneumoniae, no resistance.  This is consistent with urine cx results.   Name of physician (or Provider) Contacted: Laurence Pons, NP  Current antibiotics: Ancef   Changes to prescribed antibiotics recommended:  Patient is on recommended antibiotics - No changes needed  No results found for this or any previous visit.  Arie Kurtz PharmD 02/23/2024  6:18 AM

## 2024-02-23 NOTE — Inpatient Diabetes Management (Signed)
 Inpatient Diabetes Program Recommendations  AACE/ADA: New Consensus Statement on Inpatient Glycemic Control (2015)  Target Ranges:  Prepandial:   less than 140 mg/dL      Peak postprandial:   less than 180 mg/dL (1-2 hours)      Critically ill patients:  140 - 180 mg/dL   Lab Results  Component Value Date   GLUCAP 98 02/23/2024   HGBA1C 7.9 (H) 02/18/2024    Review of Glycemic Control  Latest Reference Range & Units 02/22/24 20:01 02/22/24 21:46 02/23/24 00:47 02/23/24 04:00 02/23/24 04:31 02/23/24 06:49  Glucose-Capillary 70 - 99 mg/dL 284 (H) 132 (H) 79 64 (L) 108 (H) 98  (H): Data is abnormally high (L): Data is abnormally low Diabetes history: Type 2 DM Outpatient Diabetes medications: Janumet 50-1000 mg BID, Novolog  0-12 units TID, Tresiba 13 units QD Current orders for Inpatient glycemic control: Semglee  15 units BID,  Novolog  0-9 units Q4H   Inpatient Diabetes Program Recommendations:     Noted mild hypoglycemia this AM. Consider reducing correction to Novolog  0-6 units Q4H and Semglee  12 units BID.   Thanks, Marjo Sievert, MSN, RNC-OB Diabetes Coordinator [phone] (8a-5p)

## 2024-02-23 NOTE — Plan of Care (Signed)
 Palliative-   Received notice from Carolyn Cisco, RN that patient's son was at bedside with questions for Palliative medicine.  Met at bedside with Tom.  We discussed CT scan.  Discussed what options for continued aggressive interventions would be with limit set at no tube feeding vs transition to hospice.  Tom discussed complicated family dynamics and conflict among siblings regarding goals of care for patient.  We discussed keeping what patient's preferences would be at center of decision making.  Plan made to meet tomorrow at 4pm for further discussion with all of patient's children involved.   Micki Alas, AGNP-C Palliative Medicine  No charge

## 2024-02-23 NOTE — Plan of Care (Signed)
  Problem: Skin Integrity: Goal: Risk for impaired skin integrity will decrease Outcome: Progressing   Problem: Tissue Perfusion: Goal: Adequacy of tissue perfusion will improve Outcome: Progressing   Problem: Elimination: Goal: Will not experience complications related to urinary retention Outcome: Progressing   Problem: Fluid Volume: Goal: Ability to maintain a balanced intake and output will improve Outcome: Not Progressing   Problem: Metabolic: Goal: Ability to maintain appropriate glucose levels will improve Outcome: Not Progressing   Problem: Nutritional: Goal: Maintenance of adequate nutrition will improve Outcome: Not Progressing   Problem: Nutrition: Goal: Adequate nutrition will be maintained Outcome: Not Progressing

## 2024-02-24 ENCOUNTER — Inpatient Hospital Stay (HOSPITAL_COMMUNITY)

## 2024-02-24 DIAGNOSIS — R569 Unspecified convulsions: Secondary | ICD-10-CM | POA: Diagnosis not present

## 2024-02-24 DIAGNOSIS — R7881 Bacteremia: Secondary | ICD-10-CM

## 2024-02-24 DIAGNOSIS — F03C0 Unspecified dementia, severe, without behavioral disturbance, psychotic disturbance, mood disturbance, and anxiety: Secondary | ICD-10-CM

## 2024-02-24 DIAGNOSIS — N3000 Acute cystitis without hematuria: Secondary | ICD-10-CM | POA: Diagnosis not present

## 2024-02-24 LAB — CBC
HCT: 34.9 % — ABNORMAL LOW (ref 36.0–46.0)
Hemoglobin: 9.7 g/dL — ABNORMAL LOW (ref 12.0–15.0)
MCH: 25.9 pg — ABNORMAL LOW (ref 26.0–34.0)
MCHC: 27.8 g/dL — ABNORMAL LOW (ref 30.0–36.0)
MCV: 93.3 fL (ref 80.0–100.0)
Platelets: 161 10*3/uL (ref 150–400)
RBC: 3.74 MIL/uL — ABNORMAL LOW (ref 3.87–5.11)
RDW: 15.8 % — ABNORMAL HIGH (ref 11.5–15.5)
WBC: 8.7 10*3/uL (ref 4.0–10.5)
nRBC: 0 % (ref 0.0–0.2)

## 2024-02-24 LAB — GLUCOSE, CAPILLARY
Glucose-Capillary: 106 mg/dL — ABNORMAL HIGH (ref 70–99)
Glucose-Capillary: 117 mg/dL — ABNORMAL HIGH (ref 70–99)
Glucose-Capillary: 240 mg/dL — ABNORMAL HIGH (ref 70–99)
Glucose-Capillary: 173 mg/dL — ABNORMAL HIGH (ref 70–99)
Glucose-Capillary: 199 mg/dL — ABNORMAL HIGH (ref 70–99)

## 2024-02-24 LAB — BASIC METABOLIC PANEL WITH GFR
Anion gap: 13 (ref 5–15)
Anion gap: 9 (ref 5–15)
BUN: 21 mg/dL (ref 8–23)
BUN: 21 mg/dL (ref 8–23)
CO2: 28 mmol/L (ref 22–32)
CO2: 30 mmol/L (ref 22–32)
Calcium: 8 mg/dL — ABNORMAL LOW (ref 8.9–10.3)
Calcium: 8.1 mg/dL — ABNORMAL LOW (ref 8.9–10.3)
Chloride: 100 mmol/L (ref 98–111)
Chloride: 101 mmol/L (ref 98–111)
Creatinine, Ser: 1.15 mg/dL — ABNORMAL HIGH (ref 0.44–1.00)
Creatinine, Ser: 1.21 mg/dL — ABNORMAL HIGH (ref 0.44–1.00)
GFR, Estimated: 46 mL/min — ABNORMAL LOW (ref >60)
GFR, Estimated: 49 mL/min — ABNORMAL LOW (ref >60)
Glucose, Bld: 130 mg/dL — ABNORMAL HIGH (ref 70–99)
Glucose, Bld: 97 mg/dL (ref 70–99)
Potassium: 3.3 mmol/L — ABNORMAL LOW (ref 3.5–5.1)
Potassium: 4 mmol/L (ref 3.5–5.1)
Sodium: 139 mmol/L (ref 135–145)
Sodium: 142 mmol/L (ref 135–145)

## 2024-02-24 LAB — NOROVIRUS GROUP 1 & 2 BY PCR, STOOL
Norovirus 1 by PCR: NEGATIVE
Norovirus 2  by PCR: NEGATIVE

## 2024-02-24 MED ORDER — POTASSIUM CHLORIDE 10 MEQ/100ML IV SOLN
10.0000 meq | INTRAVENOUS | Status: AC
  Administered 2024-02-24 (×2): 10 meq via INTRAVENOUS
  Filled 2024-02-24 (×2): qty 100

## 2024-02-24 NOTE — Plan of Care (Signed)
  Problem: Metabolic: Goal: Ability to maintain appropriate glucose levels will improve Outcome: Progressing   Problem: Skin Integrity: Goal: Risk for impaired skin integrity will decrease Outcome: Progressing   Problem: Clinical Measurements: Goal: Cardiovascular complication will be avoided Outcome: Progressing   Problem: Nutritional: Goal: Maintenance of adequate nutrition will improve Outcome: Not Progressing   Problem: Activity: Goal: Risk for activity intolerance will decrease Outcome: Not Progressing   Problem: Nutrition: Goal: Adequate nutrition will be maintained Outcome: Not Progressing

## 2024-02-24 NOTE — Plan of Care (Signed)
  Problem: Education: Goal: Ability to describe self-care measures that may prevent or decrease complications (Diabetes Survival Skills Education) will improve Outcome: Progressing   Problem: Fluid Volume: Goal: Ability to maintain a balanced intake and output will improve Outcome: Progressing   Problem: Metabolic: Goal: Ability to maintain appropriate glucose levels will improve Outcome: Progressing   Problem: Nutritional: Goal: Maintenance of adequate nutrition will improve Outcome: Progressing   Problem: Clinical Measurements: Goal: Ability to maintain clinical measurements within normal limits will improve Outcome: Progressing Goal: Will remain free from infection Outcome: Progressing   Problem: Coping: Goal: Level of anxiety will decrease Outcome: Progressing

## 2024-02-24 NOTE — Progress Notes (Signed)
  Progress Note   Patient: Yvette Gutierrez ZOX:[id number] DOB: [birth year] DOA: 02/18/2024     6 DOS: the patient was seen and examined on 02/24/2024 at 8:05AM      Brief hospital course: 78 y.o. F with dementia, lives in LTC, bedbound,DM, seizures, hx CVA who was sent from SNF for convulsions, lethargy.  Per report, patient with seizure-like activity 1 week prior to admission, which resolved by itself, required no workup.  On day of admission, she had another GTC seizure, fever 102.7, so was sent to the ER.  Here, diagnosed with UTI, started on IV Rocephin , epileptics.  Since admission, the patient has remained poorly responsive, she has had 2 episodes of agonal breathing, the first responded to Keppra, antibiotics, the second (on 5/13) required required new O2.  CTA showed no PE, but did show pleural effusions and edema.    Assessment and Plan: Breakthrough seizure in the setting of UTI See longer summary by Dr. Renie Carver from 5/13.  No new seizures overnight. - Continue Vimpat  -Continue Rocephin   Acute metabolic encephalopathy At baseline, patient has advanced dementia, but follows commands, and is cooperative.  Here, she has had waxing and waning encephalopathy, characterized by inability to open her eyes, vocalize at all, follow commands.  Acute hypoxic respiratory failure Likely due to acute diastolic CHF. - Continue IV Lasix  - Delineate goals of care first, then determine next steps  AKI, suspect ATN Metabolic acidosis Hypokalemia Creatinine unchanged at 1.2 -Supplement potassium - Continue Lasix  - Daily BMP  Diarrhea, presumed C. difficile colonization -Enteric precautions  Normocytic anemia Hemoglobin stable, no clinical bleeding  Thrombocytopenia Platelet count resolved to normal  Hypertension Blood pressure has been low - Continue furosemide  - Hold lisinopril , nifedipine   Type 2 diabetes Glucoses controlled - Continue sliding scale corrections - Hold  Janumet, simvastatin , Tresiba  Cerebrovascular disease -Hold simvastatin  Aggrenox   Depression Dementia -Resume memantine when able to take p.o.          Subjective: Overnight, no change, no new nursing concerns, she remains sluggish and lethargic.  Family meeting scheduled for today at 4 PM.  No fever, no respiratory distress.     Physical Exam: BP 137/79   Pulse 76   Temp (!) 97 F (36.1 C) (Axillary)   Resp 19   Ht 5' (1.524 m)   Wt 64 kg   SpO2 99%   BMI 27.56 kg/m   Chronically ill-appearing pale elderly female, lying in bed, sluggish and lethargic, opens eyes briefly, does not make eye contact, does not follow commands, no spontaneous verbalizations or movements RRR, no murmurs, no pitting in the extremities, JVP not visible due to body habitus Respiratory effort weak, shallow, lung sounds diminished, I do not appreciate rales or wheezes although lung auscultation difficult due to poor cooperation and lethargy Abdomen soft, no distention, no ascites, no grimace to palpation, no rigidity or guarding Mentation subdued, opens eyes, briefly, does not make eye contact, pupils appear equal, she does not follow commands, unable to assess strength or symmetry of strength   Data Reviewed: Gust with palliative care Basic metabolic panel shows creatinine stable at 1.2, potassium down to 3.3 CBC shows hemoglobin up to 9.7, white blood cell count normal, platelets normal   Family Communication:     Disposition: Status is: Inpatient         Author: Ephriam Hashimoto, MD 02/24/2024 8:14 AM  For on call review [email or web address].

## 2024-02-25 ENCOUNTER — Inpatient Hospital Stay (HOSPITAL_COMMUNITY)

## 2024-02-25 DIAGNOSIS — F01C0 Vascular dementia, severe, without behavioral disturbance, psychotic disturbance, mood disturbance, and anxiety: Secondary | ICD-10-CM | POA: Diagnosis not present

## 2024-02-25 DIAGNOSIS — R7881 Bacteremia: Secondary | ICD-10-CM | POA: Diagnosis not present

## 2024-02-25 DIAGNOSIS — I5031 Acute diastolic (congestive) heart failure: Secondary | ICD-10-CM | POA: Diagnosis not present

## 2024-02-25 DIAGNOSIS — J9601 Acute respiratory failure with hypoxia: Secondary | ICD-10-CM | POA: Diagnosis not present

## 2024-02-25 DIAGNOSIS — Z515 Encounter for palliative care: Secondary | ICD-10-CM | POA: Diagnosis not present

## 2024-02-25 DIAGNOSIS — R569 Unspecified convulsions: Secondary | ICD-10-CM | POA: Diagnosis not present

## 2024-02-25 LAB — GLUCOSE, CAPILLARY
Glucose-Capillary: 159 mg/dL — ABNORMAL HIGH (ref 70–99)
Glucose-Capillary: 219 mg/dL — ABNORMAL HIGH (ref 70–99)
Glucose-Capillary: 225 mg/dL — ABNORMAL HIGH (ref 70–99)
Glucose-Capillary: 316 mg/dL — ABNORMAL HIGH (ref 70–99)
Glucose-Capillary: 336 mg/dL — ABNORMAL HIGH (ref 70–99)
Glucose-Capillary: 357 mg/dL — ABNORMAL HIGH (ref 70–99)

## 2024-02-25 LAB — ECHOCARDIOGRAM COMPLETE
AR max vel: 2.13 cm2
AV Area VTI: 2.23 cm2
AV Area mean vel: 1.86 cm2
AV Mean grad: 2 mmHg
AV Peak grad: 4.6 mmHg
Ao pk vel: 1.07 m/s
Area-P 1/2: 2.69 cm2
Calc EF: 56.1 %
Height: 60 in
MV VTI: 1.76 cm2
S' Lateral: 2.1 cm
Single Plane A2C EF: 54.9 %
Single Plane A4C EF: 56.1 %
Weight: 2257.51 [oz_av]

## 2024-02-25 LAB — COMPREHENSIVE METABOLIC PANEL WITH GFR
ALT: 7 U/L (ref 0–44)
AST: 15 U/L (ref 15–41)
Albumin: 2.2 g/dL — ABNORMAL LOW (ref 3.5–5.0)
Alkaline Phosphatase: 78 U/L (ref 38–126)
Anion gap: 10 (ref 5–15)
BUN: 24 mg/dL — ABNORMAL HIGH (ref 8–23)
CO2: 31 mmol/L (ref 22–32)
Calcium: 8.1 mg/dL — ABNORMAL LOW (ref 8.9–10.3)
Chloride: 95 mmol/L — ABNORMAL LOW (ref 98–111)
Creatinine, Ser: 0.8 mg/dL (ref 0.44–1.00)
GFR, Estimated: >60 mL/min (ref >60)
Glucose, Bld: 248 mg/dL — ABNORMAL HIGH (ref 70–99)
Potassium: 3 mmol/L — ABNORMAL LOW (ref 3.5–5.1)
Sodium: 136 mmol/L (ref 135–145)
Total Bilirubin: 0.2 mg/dL (ref 0.0–1.2)
Total Protein: 5.6 g/dL — ABNORMAL LOW (ref 6.5–8.1)

## 2024-02-25 LAB — CBC
HCT: 30.4 % — ABNORMAL LOW (ref 36.0–46.0)
Hemoglobin: 8.5 g/dL — ABNORMAL LOW (ref 12.0–15.0)
MCH: 26.2 pg (ref 26.0–34.0)
MCHC: 28 g/dL — ABNORMAL LOW (ref 30.0–36.0)
MCV: 93.8 fL (ref 80.0–100.0)
Platelets: 178 10*3/uL (ref 150–400)
RBC: 3.24 MIL/uL — ABNORMAL LOW (ref 3.87–5.11)
RDW: 15.7 % — ABNORMAL HIGH (ref 11.5–15.5)
WBC: 8.5 10*3/uL (ref 4.0–10.5)
nRBC: 0 % (ref 0.0–0.2)

## 2024-02-25 LAB — CULTURE, BLOOD (ROUTINE X 2): Culture  Setup Time: NO GROWTH

## 2024-02-25 MED ORDER — POTASSIUM CHLORIDE 10 MEQ/100ML IV SOLN
10.0000 meq | INTRAVENOUS | Status: AC
  Administered 2024-02-25 (×4): 10 meq via INTRAVENOUS
  Filled 2024-02-25 (×4): qty 100

## 2024-02-25 MED ORDER — FERROUS SULFATE 325 (65 FE) MG PO TABS
325.0000 mg | ORAL_TABLET | Freq: Every day | ORAL | Status: DC
  Administered 2024-02-26: 325 mg via ORAL
  Filled 2024-02-25 (×2): qty 1

## 2024-02-25 MED ORDER — LISINOPRIL 10 MG PO TABS: 10.0000 mg | ORAL_TABLET | Freq: Every day | ORAL | Status: DC

## 2024-02-25 MED ORDER — INSULIN GLARGINE-YFGN 100 UNIT/ML ~~LOC~~ SOLN
5.0000 [IU] | Freq: Every day | SUBCUTANEOUS | Status: DC
  Administered 2024-02-25: 5 [IU] via SUBCUTANEOUS
  Filled 2024-02-25 (×2): qty 0.05

## 2024-02-25 MED ORDER — ENSURE ENLIVE PO LIQD
237.0000 mL | Freq: Two times a day (BID) | ORAL | Status: DC
Start: 2024-02-25 — End: 2024-02-27
  Administered 2024-02-25 – 2024-02-27 (×6): 237 mL via ORAL

## 2024-02-25 MED ORDER — NIFEDIPINE ER OSMOTIC RELEASE 30 MG PO TB24
30.0000 mg | ORAL_TABLET | Freq: Every day | ORAL | Status: DC
  Administered 2024-02-25: 30 mg via ORAL
  Filled 2024-02-25 (×4): qty 1

## 2024-02-25 MED ORDER — LACOSAMIDE 50 MG PO TABS: 50.0000 mg | ORAL_TABLET | Freq: Two times a day (BID) | ORAL | Status: DC

## 2024-02-25 MED ORDER — LACOSAMIDE 50 MG PO TABS
50.0000 mg | ORAL_TABLET | Freq: Two times a day (BID) | ORAL | Status: DC
  Administered 2024-02-25 – 2024-02-26 (×4): 50 mg via ORAL
  Filled 2024-02-25 (×5): qty 1

## 2024-02-25 MED ORDER — LISINOPRIL 5 MG PO TABS
5.0000 mg | ORAL_TABLET | Freq: Every day | ORAL | Status: DC
  Administered 2024-02-25 – 2024-02-26 (×2): 5 mg via ORAL
  Filled 2024-02-25 (×3): qty 1

## 2024-02-25 MED ORDER — ASPIRIN-DIPYRIDAMOLE ER 25-200 MG PO CP12
1.0000 | ORAL_CAPSULE | Freq: Two times a day (BID) | ORAL | Status: DC
  Administered 2024-02-25 – 2024-02-26 (×2): 1 via ORAL
  Filled 2024-02-25 (×6): qty 1

## 2024-02-25 MED ORDER — ENSURE ENLIVE PO LIQD: 237.0000 mL | Freq: Two times a day (BID) | ORAL | Status: DC

## 2024-02-25 MED ORDER — SIMVASTATIN 20 MG PO TABS
10.0000 mg | ORAL_TABLET | Freq: Every day | ORAL | Status: DC
Start: 2024-02-25 — End: 2024-02-27
  Administered 2024-02-25 – 2024-02-26 (×2): 10 mg via ORAL
  Filled 2024-02-25 (×2): qty 1

## 2024-02-25 MED ORDER — MEMANTINE HCL 10 MG PO TABS
10.0000 mg | ORAL_TABLET | Freq: Two times a day (BID) | ORAL | Status: DC
  Administered 2024-02-25 – 2024-02-26 (×3): 10 mg via ORAL
  Filled 2024-02-25 (×4): qty 1

## 2024-02-25 NOTE — Progress Notes (Addendum)
 Daily Progress Note   Patient Name: Yvette Gutierrez       Date: 02/25/2024 DOB: 02-17-1946  Age: 78 y.o. MRN#: [id number] Attending Physician: Ephriam Hashimoto, * Primary Care Physician: Patient, No Pcp Per Admit Date: 02/18/2024  Reason for Consultation/Follow-up: Establishing goals of care  Patient Profile/HPI: 78 y.o. female  with past medical history of advanced dementia, DM2, hypertension, hyperlipidemia, CVA, depression, seizure disorder, anemia, bedbound admitted on 02/18/2024 with seizure and lethargy.    During ED workup, patient was also noted to have UTI and sepsis.  Per neurology, breakthrough seizure is likely due to infectious process lowering seizure threshold, compounded by seizure medications not received for around 24 hours while inpatient.    Also noted to have blood cultures positive for klebsiella.   PMT has been consulted to assist with goals of care conversation.  Subjective: Chart reviewed including labs, progress notes, imaging from this and previous encounters.  Discussed with attending provider- plan is to continue diuresis for a few more days and then d/c back to home (LTC).  Labs- CMET- significant for low albumin 2.2, hypokalemia 3.0- likely due to poor po intake. Chest xray from today reviewed by me- shows improved aeration Patient is awake and alert, smiling, denies pain. Daughter- in law at bedside- she drank a full Ensure and ate a few bites of eggs.  Attempted to call patient's son Yvette Gutierrez) for followup of discussion yesterday. Message left requesting return call.   Addendum- spoke with Yvette Gutierrez. Further discussion had regarding complicated family dynamics. Goal is to provide best care for O'Connor Gutierrez, but also preserve relationship among him and his siblings.   Plan for now will be to continue medical interventions, limit set at no feeding tube, DNR, rehospitalization ok. If patient has recurrent rehospitalizations then re-evaluate goals.   Review of Systems  Unable to perform ROS: Mental status change     Physical Exam Vitals and nursing note reviewed.  Constitutional:      General: She is not in acute distress. Cardiovascular:     Rate and Rhythm: Normal rate.  Pulmonary:     Comments: Some increased effort, pursed lip breathing at times Neurological:     Mental Status: She is alert.     Comments: nonverbal  Vital Signs: BP (!) 159/76 (BP Location: Left Arm)   Pulse 85   Temp 97.8 F (36.6 C) (Axillary)   Resp 16   Ht 5' (1.524 m)   Wt 64 kg   SpO2 96%   BMI 27.56 kg/m  SpO2: SpO2: 96 % O2 Device: O2 Device: Nasal Cannula O2 Flow Rate: O2 Flow Rate (L/min): 3 L/min  Intake/output summary:  Intake/Output Summary (Last 24 hours) at 02/25/2024 1325 Last data filed at 02/25/2024 1300 Gross per 24 hour  Intake 772.26 ml  Output 2400 ml  Net -1627.74 ml   LBM: Last BM Date : 02/24/24 Baseline Weight: Weight: 64.5 kg Most recent weight: Weight: 64 kg       Palliative Assessment/Data: PPS: 20%      Patient Active Problem List   Diagnosis Date Noted   Acute respiratory failure with hypoxia (HCC) 02/23/2024   Bedbound 02/19/2024   Seizure (HCC) 02/18/2024   Femur fracture (HCC) 06/15/2021   History of cardioembolic cerebrovascular accident (CVA) 12/31/2016   Vascular dementia without behavioral disturbance (HCC) 12/31/2016   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   CKD (chronic kidney disease) stage 3, GFR 30-59 ml/min (HCC) 08/26/2016   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 disease, with long-term current use of insulin  (HCC) 10/29/2015   Essential hypertension, benign 10/29/2015   DM type 2 with diabetic peripheral neuropathy (HCC) 02/14/2015   Major depressive  disorder, recurrent episode, mild (HCC) 02/14/2015   Umbilical hernia without obstruction and without gangrene 02/14/2015   Primary osteoarthritis of both shoulders 02/14/2015   Slow transit constipation 02/14/2015   Seizure disorder (HCC) 02/14/2015   History of fall 07-24-2014   Death of family member 02-26-2014   Rash and nonspecific skin eruption 05/04/2013   OSA (obstructive sleep apnea) 04/06/2013   Abnormality of gait 04/05/2013   Diabetic retinopathy (HCC) 06/22/2012   Hyperlipidemia LDL goal <70 04/02/2012   Cervical cancer screening 05/28/2011   Colon cancer screening 05/28/2011   Allergic rhinitis    Neuropathy due to secondary diabetes (HCC)    Cerebral infarction (HCC)    Bilateral shoulder pain    Backache 07/09/2010   Vitamin D  deficiency 10/31/2009   DEPENDENT EDEMA, LEGS, BILATERAL 10/31/2009   OBESITY, MORBID 05/14/2007   ONYCHOMYCOSIS 04/02/2007   DIABETES MELLITUS, TYPE II 03/31/2007   Major depression, chronic 03/31/2007   Essential hypertension 03/31/2007    Palliative Care Assessment & Plan    Assessment/Recommendations/Plan  Continue current interventions- likely to d/c back to long term care facility- family considering hospice Message left for son/HCPOA Yvette Gutierrez with request for return call  Addendum- goals are to continue medical interventions- agreeable to outpatient Palliative services at SNF, reconsider goals if patient has recurrent hospitalizations    Code Status:   Code Status: Limited: Do not attempt resuscitation (DNR) -DNR-LIMITED -Do Not Intubate/DNI    Prognosis:  Unable to determine  Discharge Planning: To Be Determined  Care plan was discussed with Dr. Darlyn Eke  Thank you for allowing the Palliative Medicine Team to assist in the care of this patient.  Prolonged billing:  Time includes:   Preparing to see the patient (e.g., review of tests) Obtaining and/or reviewing separately obtained history Performing a medically  necessary appropriate examination and/or evaluation Counseling and educating the patient/family/caregiver Ordering medications, tests, or procedures Referring and communicating with other health care professionals (when not reported separately) Documenting clinical information in the electronic or other health record Independently interpreting results (not reported  separately) and communicating results to the patient/family/caregiver Care coordination (not reported separately) Clinical documentation  Micki Alas, AGNP-C Palliative Medicine   Please contact Palliative Medicine Team phone at [phone] for questions and concerns.       Aaron Aas

## 2024-02-25 NOTE — Plan of Care (Signed)

## 2024-02-25 NOTE — Progress Notes (Signed)
 Daily Progress Note   Patient Name: Yvette Gutierrez       Date: 02/25/2024 DOB: January 24, 1946  Age: 78 y.o. MRN#: [id number] Attending Physician: Ephriam Hashimoto, * Primary Care Physician: Patient, No Pcp Per Admit Date: 02/18/2024  Reason for Consultation/Follow-up: Establishing goals of care  Patient Profile/HPI:  78 y.o. female  with past medical history of advanced dementia, DM2, hypertension, hyperlipidemia, CVA, depression, seizure disorder, anemia, bedbound admitted on 02/18/2024 with seizure and lethargy.    During ED workup, patient was also noted to have UTI and sepsis.  Per neurology, breakthrough seizure is likely due to infectious process lowering seizure threshold, compounded by seizure medications not received for around 24 hours while inpatient.    PMT has been consulted to assist with goals of care conversation.    Subjective: Chart reviewed- patient with decreased oxygen requirements today. Cr is stable at 1.21<1.15<1.20. wBC normal - 8.7. Blood cultures also growing Klebsiella pneumoniae.  Discussed with RN- patient has been a little more awake and alert, did eat some. Has not had further seizures.   Met with patient's two sons- Cloyce Darby, daughterGlenora [place], and daughters in law Gwynn and [place]. Patient's other son was also present via speakerphone.   Life review- patient known to be sweet and loving. A natural nurturer and caretaker. Very much loves spending time with her family.   Functional status- has been bedbound for quite some time. Lives at [facility]. On a good day she can recognize her sons and daughter but often takes a few minutes before recognizing. Prior to admission could verbalize answers to most questions, but not have indepth discussion. Spends most of her  time in bed, watching tv. Incontinent of urine and stool.   We discussed her current acute illness in the setting of her chronic illness. Reviewed how acute insults affect long term trajectory of dementia and causes worsening of status.   Advanced care planning 60 minutes With families permission Advanced Care Planning was discussed due to patient's advanced dementia and poor functional status.   Very long discussion had with family members.   Discussed meaning of quality of life and how patient's illness affects quality of life.   Patient's children expressed differing views about their mom's quality of life.   Paths of care were discussed as well and  likely trajectories- continued aggressive medical interventions with return to facility and likely repeated hospitalizations vs transitioning to comfort focused care and hospice. At this point patient may or may not be candidate for inpatient hospice- but would definitely be eligible for hospice services at home. We discussed that she is not likely to have any restoration of functional status and is at new baseline.  Advanced directives discussed- patient has DNR/DNI in place. Artificial feeding would not be recommended. Discussed that various studies have not shown use of feeding tubes to be effective in preventing malnutrition. Furthermore, they have not been demonstrated to prevent the occurrence or increase the healing of pressure sores, prevent aspiration pneumonia, provide comfort, improve functional status, or extend life. High complication rates, increased use of restraints, and other adverse effects further increase the burden of feeding tubes in severely demented patients.  After long discussion of above- consensus of plan was established. For now continue to treat what is treatable. Family will continue to discuss option of establishing care with hospice, with limit set at DNR and no artificial feeding tube.   Review of Systems  Unable  to perform ROS: Mental status change     Physical Exam Vitals and nursing note reviewed.  Cardiovascular:     Rate and Rhythm: Normal rate.  Pulmonary:     Effort: Pulmonary effort is normal.  Neurological:     Mental Status: She is alert.     Comments: Did not respond verbally to questions  Psychiatric:     Comments: Smiles             Vital Signs: BP (!) 159/60   Pulse 78   Temp 97.7 F (36.5 C) (Axillary)   Resp (!) 35   Ht 5' (1.524 m)   Wt 64 kg   SpO2 98%   BMI 27.56 kg/m  SpO2: SpO2: 98 % O2 Device: O2 Device: Nasal Cannula O2 Flow Rate: O2 Flow Rate (L/min): 3 L/min  Intake/output summary:  Intake/Output Summary (Last 24 hours) at 02/25/2024 0849 Last data filed at 02/25/2024 0615 Gross per 24 hour  Intake 679.4 ml  Output 2450 ml  Net -1770.6 ml   LBM: Last BM Date : 02/24/24 Baseline Weight: Weight: 64.5 kg Most recent weight: Weight: 64 kg       Palliative Assessment/Data: PPS: 20%      Patient Active Problem List   Diagnosis Date Noted   Acute respiratory failure with hypoxia (HCC) 02/23/2024   Bedbound 02/19/2024   Seizure (HCC) 02/18/2024   Femur fracture (HCC) 06/15/2021   History of cardioembolic cerebrovascular accident (CVA) 12/31/2016   Vascular dementia without behavioral disturbance (HCC) 12/31/2016   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   CKD (chronic kidney disease) stage 3, GFR 30-59 ml/min (HCC) 08/26/2016   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 disease, with long-term current use of insulin  (HCC) 10/29/2015   Essential hypertension, benign 10/29/2015   DM type 2 with diabetic peripheral neuropathy (HCC) 02/14/2015   Major depressive disorder, recurrent episode, mild (HCC) 02/14/2015   Umbilical hernia without obstruction and without gangrene 02/14/2015   Primary osteoarthritis of both shoulders 02/14/2015   Slow transit constipation 02/14/2015   Seizure disorder (HCC) 02/14/2015   History  of fall 2014/07/08   Death of family member 02-10-14   Rash and nonspecific skin eruption 05/04/2013   OSA (obstructive sleep apnea) 04/06/2013   Abnormality of gait 04/05/2013   Diabetic retinopathy (HCC) 06/22/2012   Hyperlipidemia LDL  goal <70 04/02/2012   Cervical cancer screening 05/28/2011   Colon cancer screening 05/28/2011   Allergic rhinitis    Neuropathy due to secondary diabetes [facility])    Cerebral infarction [facility])    Bilateral shoulder pain    Backache 07/09/2010   Vitamin D  deficiency 10/31/2009   DEPENDENT EDEMA, LEGS, BILATERAL 10/31/2009   OBESITY, MORBID 05/14/2007   ONYCHOMYCOSIS 04/02/2007   DIABETES MELLITUS, TYPE II 03/31/2007   Major depression, chronic 03/31/2007   Essential hypertension 03/31/2007    Palliative Care Assessment & Plan    Assessment/Recommendations/Plan  Continue current interventions Paths of care discussed included returning home (long term care) with likely recurrent rehospitalization vs comfort/hospice care- family not ready to make decisions today   Code Status:   Code Status: Limited: Do not attempt resuscitation (DNR) -DNR-LIMITED -Do Not Intubate/DNI    Prognosis:  < 3 months  Discharge Planning: To Be Determined  Care plan was discussed with patient's family and bedside RN.   Thank you for allowing the Palliative Medicine Team to assist in the care of this patient.  Time includes:   Preparing to see the patient (e.g., review of tests) Obtaining and/or reviewing separately obtained history Performing a medically necessary appropriate examination and/or evaluation Counseling and educating the patient/family/caregiver Ordering medications, tests, or procedures Referring and communicating with other health care professionals (when not reported separately) Documenting clinical information in the electronic or other health record Independently interpreting results (not reported separately) and communicating results to the  patient/family/caregiver Care coordination (not reported separately) Clinical documentation  Micki Alas, AGNP-C Palliative Medicine   Please contact Palliative Medicine Team phone at [phone] for questions and concerns.

## 2024-02-25 NOTE — Hospital Course (Signed)
 78 y.o. F with dementia, lives in LTC, bedbound,DM, seizures, hx CVA who was sent from SNF for convulsions, lethargy.   Per report, patient with seizure-like activity 1 week prior to admission, which resolved by itself, required no workup.   On day of admission, she had another GTC seizure, fever 102.7, so was sent to the ER.   Here, diagnosed with UTI, started on IV Rocephin , anti-epileptics.   Since admission, the patient has remained poorly responsive, she has had 2 episodes of agonal breathing, the first responded to Keppra, antibiotics, the second (on 5/13) required required new O2.  CTA showed no PE, but did show pleural effusions and edema.

## 2024-02-25 NOTE — Progress Notes (Signed)
  Echocardiogram 2D Echocardiogram has been performed.  Sindee Stucker L Tyquisha Sharps RDCS 02/25/2024, 3:39 PM

## 2024-02-25 NOTE — TOC Progression Note (Signed)
 Transition of Care [facility]) - Progression Note    Patient Details  Name: RASHITA KUHLMAN MRN: [id number] Date of Birth: November 23, 1945  Transition of Care [facility]) CM/SW Contact  Alfrieda Tarry, Thersia Flax, RN Phone Number: 02/25/2024, 3:59 PM  Clinical Narrative: Noted return back to [facility] LTC rep Daril Edge following w/otpt palliative care services.      Expected Discharge Plan: Long Term Nursing Home Barriers to Discharge: Continued Medical Work up  Expected Discharge Plan and Services In-house Referral: Clinical Social Work   Post Acute Care Choice: Nursing Home Living arrangements for the past 2 months: Skilled Nursing Facility                 DME Arranged: N/A DME Agency: NA                   Social Determinants of Health (SDOH) Interventions SDOH Screenings   Food Insecurity: Patient Unable To Answer (02/19/2024)  Housing: Patient Unable To Answer (02/19/2024)  Transportation Needs: Patient Unable To Answer (02/19/2024)  Utilities: Patient Unable To Answer (02/19/2024)  Social Connections: Patient Unable To Answer (02/19/2024)  Tobacco Use: Low Risk  (02/19/2024)    Readmission Risk Interventions     No data to display

## 2024-02-25 NOTE — TOC Progression Note (Addendum)
 Transition of Care [facility]) - Progression Note    Patient Details  Name: Yvette Gutierrez MRN: [id number] Date of Birth: [birth year]  Transition of Care [facility]) CM/SW Contact  Levie Ream, RN Phone Number: 02/25/2024, 3:05 PM  Clinical Narrative:    Pt not medically ready to d/c back to [facility]; [facility] is following.   Expected Discharge Plan: Long Term Nursing Home Barriers to Discharge: Continued Medical Work up  Expected Discharge Plan and Services In-house Referral: Clinical Social Work   Post Acute Care Choice: Nursing Home Living arrangements for the past 2 months: Skilled Nursing Facility                 DME Arranged: N/A DME Agency: NA                   Social Determinants of Health (SDOH) Interventions SDOH Screenings   Food Insecurity: Patient Unable To Answer (02/19/2024)  Housing: Patient Unable To Answer (02/19/2024)  Transportation Needs: Patient Unable To Answer (02/19/2024)  Utilities: Patient Unable To Answer (02/19/2024)  Social Connections: Patient Unable To Answer (02/19/2024)  Tobacco Use: Low Risk  (02/19/2024)    Readmission Risk Interventions     No data to display

## 2024-02-25 NOTE — Plan of Care (Signed)
  Problem: Skin Integrity: Goal: Risk for impaired skin integrity will decrease Outcome: Progressing   Problem: Tissue Perfusion: Goal: Adequacy of tissue perfusion will improve Outcome: Progressing   Problem: Clinical Measurements: Goal: Respiratory complications will improve Outcome: Progressing Goal: Cardiovascular complication will be avoided Outcome: Progressing   Problem: Nutritional: Goal: Maintenance of adequate nutrition will improve Outcome: Not Progressing   Problem: Health Behavior/Discharge Planning: Goal: Ability to manage health-related needs will improve Outcome: Not Progressing

## 2024-02-25 NOTE — Progress Notes (Signed)
 Progress Note   Patient: Yvette Gutierrez ZOX:[id number] DOB: 01-14-1946 DOA: 02/18/2024     7 DOS: the patient was seen and examined on 02/25/2024 at 7:38AM and 12:20PM      Brief hospital course: 78 y.o. F with dementia, lives in LTC, bedbound,DM, seizures, hx CVA who was sent from SNF for convulsions, lethargy.   Per report, patient with seizure-like activity 1 week prior to admission, which resolved by itself, required no workup.   On day of admission, she had another GTC seizure, fever 102.7, so was sent to the ER.   Here, diagnosed with UTI, started on IV Rocephin , anti-epileptics.   Since admission, the patient has remained poorly responsive, she has had 2 episodes of agonal breathing, the first responded to Keppra, antibiotics, the second (on 5/13) required required new O2.  CTA showed no PE, but did show pleural effusions and edema.     Assessment and Plan: Breakthrough seizure in the setting of urinary tract infection -Transition Vimpat  back to oral - Continue Rocephin , day 6 of 7  Acute metabolic encephalopathy has been bedbound for quite some time. Lives at [facility]. On a good day she can recognize her sons and daughter but often takes a few minutes before recognizing. Prior to admission could verbalize answers to most questions, but not have indepth discussion. Spends most of her time in bed, watching tv. Incontinent of urine and stool.  Here has had significantly decreased responsiveness, sluggishness.  Suspect this is new baseline  Acute hypoxic respiratory failure due to acute diastolic congestive heart failure Chest x-ray today shows persistent opacities, improved from prior Net -1.7 L, creatinine stable - Supplement potassium - Obtain echocardiogram - Continue IV Lasix   AKI, resolving Metabolic acidosis Hypokalemia Potassium stable, creatinine improved with diuresis -Supplement potassium - Continue Lasix  -Daily BMP  Diarrhea, presumed C. difficile  colonization -Maintain enteric precautions  Normocytic anemia Hemoglobin trending down to 8.5, no clinical bleeding observed or reported  Thrombocytopenia Platelet count resolved  Hypertension Blood pressure elevated - Resume home lisinopril , nifedipine  - Stop metoprolol  Type 2 diabetes Glucose is labile, somewhat elevated today - Continue sliding scale corrections - Hold home Janumet - Resume home Tresiba, lower dose  Cerebrovascular disease -Resume home simvastatin , Aggrenox   Depression, dementia -Resume home memantine         Subjective: Patient is nonverbal.  Nursing have no concerns.  She is having no diarrhea, fever, respiratory symptoms.  No skin breakdown.  During family meeting yesterday, decision was made to continue current cares, treat the treatable.  No feeding tube, no CPR.  Unclear if she will enroll in hospice at the time of discharge back to LTC.     Physical Exam: BP (!) 159/76   Pulse 85   Temp 97.7 F (36.5 C) (Axillary)   Resp 16   Ht 5' (1.524 m)   Wt 64 kg   SpO2 96%   BMI 27.56 kg/m   Obese adult female, lying in bed, debilitated, poorly responsive RRR, no murmurs, no peripheral pitting edema, JVP not visible Respiratory effort shallow, lung sounds diminished, no rales or wheezes appreciated Abdomen soft, no grimace to palpation Poorly responsive, opens eyes to touch, does not make any spontaneous verbalizations, does not follow commands, appears to have generalized weakness    Data Reviewed: Discussed with palliative care Basic metabolic panel shows improved creatinine, potassium down to 3.0 CBC shows hemoglobin down to 8.5 Chest x-ray, personally reviewed, shows bilateral airspace disease, but improved from previous,  effusion still present  Family Communication: None present    Disposition: Status is: Inpatient The patient has made no recovery with antibiotics or antiepileptics and there is a high likelihood that this is  her new functiona/cognitive baseline  GOC have been delineated by Palliative.  WIll anticipate continued diuresis for few more days, resumption of other oral home medicines, possbly discharge by the weekend.        Author: Ephriam Hashimoto, MD 02/25/2024 12:55 PM  For on call review [email or web address].

## 2024-02-26 DIAGNOSIS — J9601 Acute respiratory failure with hypoxia: Secondary | ICD-10-CM | POA: Diagnosis not present

## 2024-02-26 DIAGNOSIS — F01C0 Vascular dementia, severe, without behavioral disturbance, psychotic disturbance, mood disturbance, and anxiety: Secondary | ICD-10-CM

## 2024-02-26 DIAGNOSIS — Z515 Encounter for palliative care: Secondary | ICD-10-CM | POA: Diagnosis not present

## 2024-02-26 DIAGNOSIS — N3000 Acute cystitis without hematuria: Secondary | ICD-10-CM

## 2024-02-26 DIAGNOSIS — R569 Unspecified convulsions: Secondary | ICD-10-CM | POA: Diagnosis not present

## 2024-02-26 LAB — GLUCOSE, CAPILLARY
Glucose-Capillary: 292 mg/dL — ABNORMAL HIGH (ref 70–99)
Glucose-Capillary: 318 mg/dL — ABNORMAL HIGH (ref 70–99)
Glucose-Capillary: 359 mg/dL — ABNORMAL HIGH (ref 70–99)
Glucose-Capillary: 394 mg/dL — ABNORMAL HIGH (ref 70–99)
Glucose-Capillary: 456 mg/dL — ABNORMAL HIGH (ref 70–99)
Glucose-Capillary: 485 mg/dL — ABNORMAL HIGH (ref 70–99)

## 2024-02-26 LAB — CBC
HCT: 28.7 % — ABNORMAL LOW (ref 36.0–46.0)
Hemoglobin: 8.2 g/dL — ABNORMAL LOW (ref 12.0–15.0)
MCH: 26.3 pg (ref 26.0–34.0)
MCHC: 28.6 g/dL — ABNORMAL LOW (ref 30.0–36.0)
MCV: 92 fL (ref 80.0–100.0)
Platelets: 186 10*3/uL (ref 150–400)
RBC: 3.12 MIL/uL — ABNORMAL LOW (ref 3.87–5.11)
RDW: 15.8 % — ABNORMAL HIGH (ref 11.5–15.5)
WBC: 8 10*3/uL (ref 4.0–10.5)
nRBC: 0.3 % — ABNORMAL HIGH (ref 0.0–0.2)

## 2024-02-26 LAB — BASIC METABOLIC PANEL WITH GFR
Anion gap: 10 (ref 5–15)
BUN: 25 mg/dL — ABNORMAL HIGH (ref 8–23)
CO2: 33 mmol/L — ABNORMAL HIGH (ref 22–32)
Calcium: 8.3 mg/dL — ABNORMAL LOW (ref 8.9–10.3)
Chloride: 90 mmol/L — ABNORMAL LOW (ref 98–111)
Creatinine, Ser: 1.09 mg/dL — ABNORMAL HIGH (ref 0.44–1.00)
GFR, Estimated: 52 mL/min — ABNORMAL LOW (ref >60)
Glucose, Bld: 375 mg/dL — ABNORMAL HIGH (ref 70–99)
Potassium: 3.3 mmol/L — ABNORMAL LOW (ref 3.5–5.1)
Sodium: 133 mmol/L — ABNORMAL LOW (ref 135–145)

## 2024-02-26 MED ORDER — POTASSIUM CHLORIDE CRYS ER 20 MEQ PO TBCR: 20.0000 meq | EXTENDED_RELEASE_TABLET | Freq: Every day | ORAL | Status: DC

## 2024-02-26 MED ORDER — INSULIN ASPART 100 UNIT/ML IJ SOLN
0.0000 [IU] | Freq: Three times a day (TID) | INTRAMUSCULAR | Status: DC
Start: 2024-02-26 — End: 2024-02-27
  Administered 2024-02-26: 15 [IU] via SUBCUTANEOUS
  Administered 2024-02-26: 8 [IU] via SUBCUTANEOUS
  Administered 2024-02-26: 15 [IU] via SUBCUTANEOUS
  Administered 2024-02-27: 3 [IU] via SUBCUTANEOUS
  Administered 2024-02-27: 11 [IU] via SUBCUTANEOUS

## 2024-02-26 MED ORDER — INSULIN ASPART 100 UNIT/ML IJ SOLN
0.0000 [IU] | Freq: Every day | INTRAMUSCULAR | Status: DC
Start: 2024-02-26 — End: 2024-02-27

## 2024-02-26 MED ORDER — INSULIN ASPART 100 UNIT/ML IJ SOLN
12.0000 [IU] | Freq: Once | INTRAMUSCULAR | Status: AC
  Administered 2024-02-26: 12 [IU] via SUBCUTANEOUS

## 2024-02-26 MED ORDER — INSULIN GLARGINE-YFGN 100 UNIT/ML ~~LOC~~ SOLN
15.0000 [IU] | Freq: Two times a day (BID) | SUBCUTANEOUS | Status: DC
  Administered 2024-02-26 – 2024-02-27 (×3): 15 [IU] via SUBCUTANEOUS
  Filled 2024-02-26 (×4): qty 0.15

## 2024-02-26 MED ORDER — INSULIN ASPART 100 UNIT/ML IJ SOLN
10.0000 [IU] | Freq: Once | INTRAMUSCULAR | Status: AC
  Administered 2024-02-26: 10 [IU] via SUBCUTANEOUS

## 2024-02-26 MED ORDER — SODIUM CHLORIDE 0.9% FLUSH
10.0000 mL | Freq: Two times a day (BID) | INTRAVENOUS | Status: DC
  Administered 2024-02-26 – 2024-02-27 (×2): 10 mL

## 2024-02-26 MED ORDER — FUROSEMIDE 20 MG PO TABS: 20.0000 mg | ORAL_TABLET | Freq: Every day | ORAL | Status: DC

## 2024-02-26 MED ORDER — LINAGLIPTIN 5 MG PO TABS
5.0000 mg | ORAL_TABLET | Freq: Every day | ORAL | Status: DC
  Administered 2024-02-26: 5 mg via ORAL
  Filled 2024-02-26 (×2): qty 1

## 2024-02-26 MED ORDER — METFORMIN HCL 500 MG PO TABS
500.0000 mg | ORAL_TABLET | Freq: Two times a day (BID) | ORAL | Status: DC
  Administered 2024-02-26: 500 mg via ORAL
  Filled 2024-02-26 (×2): qty 1

## 2024-02-26 MED ORDER — SODIUM CHLORIDE 0.9% FLUSH: 10.0000 mL | INTRAVENOUS | Status: DC | PRN

## 2024-02-26 NOTE — Progress Notes (Signed)
 Patient refuses all PO meds offered by staff, despite crushed in applesauce. Will accept PO meds offered by family, who recommend crushing in applesauce. MD aware.  Also refused to eat dinner offered by staff, family has more success with getting her to eat as well. MD aware.

## 2024-02-26 NOTE — Progress Notes (Signed)
 [id number] Liaison Note  Notified by [facility] manager of patient/family request for outpatient palliative at the facility after discharge.  Hospital liaison team will follow for discharge disposition.  Thank you for the opportunity to participate in this patients care.  Please call with any questions or concerns.  Thank you, Lestine Rathke, BSN, [facility] [phone]

## 2024-02-26 NOTE — Plan of Care (Signed)

## 2024-02-26 NOTE — Progress Notes (Signed)

## 2024-02-26 NOTE — TOC Progression Note (Signed)
 Transition of Care [facility]) - Progression Note    Patient Details  Name: Yvette Gutierrez MRN: [id number] Date of Birth: [birth year]  Transition of Care [facility]) CM/SW Contact  Tessie Fila, RN Phone Number: 02/26/2024, 1:25 PM  Clinical Narrative:    TOC confirmed with Star at [facility] that pt would be able to return over the weekend or when medically ready. Star confirmed facility uses [facility] for palliative. Spoke with Dwane Gitelman wit [facility] to make outpatient palliative referral and she will accept this pt. TOC will follow.   Expected Discharge Plan: Long Term Nursing Home Barriers to Discharge: Continued Medical Work up  Expected Discharge Plan and Services In-house Referral: Clinical Social Work   Post Acute Care Choice: Nursing Home Living arrangements for the past 2 months: Skilled Nursing Facility                 DME Arranged: N/A DME Agency: NA                   Social Determinants of Health (SDOH) Interventions SDOH Screenings   Food Insecurity: Patient Unable To Answer (02/19/2024)  Housing: Patient Unable To Answer (02/19/2024)  Transportation Needs: Patient Unable To Answer (02/19/2024)  Utilities: Patient Unable To Answer (02/19/2024)  Social Connections: Patient Unable To Answer (02/19/2024)  Tobacco Use: Low Risk  (02/19/2024)    Readmission Risk Interventions     No data to display

## 2024-02-26 NOTE — Progress Notes (Signed)
 Daily Progress Note   Patient Name: Yvette Gutierrez       Date: 02/26/2024 DOB: 1946/10/11  Age: 78 y.o. MRN#: [id number] Attending Physician: Ephriam Hashimoto, * Primary Care Physician: Patient, No Pcp Per Admit Date: 02/18/2024  Reason for Consultation/Follow-up: Establishing goals of care  Patient Profile/HPI: 78 y.o. female  with past medical history of advanced dementia, DM2, hypertension, hyperlipidemia, CVA, depression, seizure disorder, anemia, bedbound admitted on 02/18/2024 with seizure and lethargy.    During ED workup, patient was also noted to have UTI and sepsis.  Per neurology, breakthrough seizure is likely due to infectious process lowering seizure threshold, compounded by seizure medications not received for around 24 hours while inpatient.    Also noted to have blood cultures positive for klebsiella.    PMT has been consulted to assist with goals of care conversation.  Subjective: Chart reviewed. ECHO completed- grade 1 diastolic heart failure.  Discussed with attending- Dr. Darlyn Eke. Patient nearing completion of inpatient needs.  Discussed with RN- patient declined oral meds this morning- however, with family at bedside- plan to attempt again.  On eval- patient awake, alert. Answers some questions with one word. Follows some commands.  Continues to have minimal po intake, requires hand feeding.  Discussed patient likely to discharge in next day or two. Family understands high risk for decompensation and rehospitalization.    Review of Systems  Unable to perform ROS: Mental status change     Physical Exam Constitutional:      General: She is not in acute distress.    Appearance: She is obese.  Cardiovascular:     Rate and Rhythm: Normal rate.  Pulmonary:      Effort: Pulmonary effort is normal.  Skin:    General: Skin is warm and dry.     Coloration: Skin is pale.  Neurological:     Mental Status: She is alert. Mental status is at baseline.     Comments: Minimally verbal, pleasant             Vital Signs: BP (!) 161/68 (BP Location: Left Arm)   Pulse 98   Temp 98.4 F (36.9 C) (Oral)   Resp 16   Ht 5' (1.524 m)   Wt 64 kg   SpO2 96%   BMI 27.56 kg/m  SpO2: SpO2:  96 % O2 Device: O2 Device: Nasal Cannula O2 Flow Rate: O2 Flow Rate (L/min): 3 L/min  Intake/output summary:  Intake/Output Summary (Last 24 hours) at 02/26/2024 1034 Last data filed at 02/26/2024 0433 Gross per 24 hour  Intake 342.86 ml  Output 2850 ml  Net -2507.14 ml   LBM: Last BM Date : 02/25/24 Baseline Weight: Weight: 64.5 kg Most recent weight: Weight: 64 kg       Palliative Assessment/Data: PPS:  20%      Patient Active Problem List   Diagnosis Date Noted   Acute respiratory failure with hypoxia (HCC) 02/23/2024   Bedbound 02/19/2024   Seizure (HCC) 02/18/2024   Femur fracture (HCC) 06/15/2021   History of cardioembolic cerebrovascular accident (CVA) 12/31/2016   Vascular dementia without behavioral disturbance (HCC) 12/31/2016   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   CKD (chronic kidney disease) stage 3, GFR 30-59 ml/min (HCC) 08/26/2016   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 disease, with long-term current use of insulin  (HCC) 10/29/2015   Essential hypertension, benign 10/29/2015   DM type 2 with diabetic peripheral neuropathy (HCC) 02/14/2015   Major depressive disorder, recurrent episode, mild (HCC) 02/14/2015   Umbilical hernia without obstruction and without gangrene 02/14/2015   Primary osteoarthritis of both shoulders 02/14/2015   Slow transit constipation 02/14/2015   Seizure disorder (HCC) 02/14/2015   History of fall 2014/07/30   Death of family member 2014/03/04   Rash and nonspecific skin  eruption 05/04/2013   OSA (obstructive sleep apnea) 04/06/2013   Abnormality of gait 04/05/2013   Diabetic retinopathy (HCC) 06/22/2012   Hyperlipidemia LDL goal <70 04/02/2012   Cervical cancer screening 05/28/2011   Colon cancer screening 05/28/2011   Allergic rhinitis    Neuropathy due to secondary diabetes (HCC)    Cerebral infarction (HCC)    Bilateral shoulder pain    Backache 07/09/2010   Vitamin D  deficiency 10/31/2009   DEPENDENT EDEMA, LEGS, BILATERAL 10/31/2009   OBESITY, MORBID 05/14/2007   ONYCHOMYCOSIS 04/02/2007   DIABETES MELLITUS, TYPE II 03/31/2007   Major depression, chronic 03/31/2007   Essential hypertension 03/31/2007    Palliative Care Assessment & Plan    Assessment/Recommendations/Plan  Continue current interventions, plan for d/c to SNF with Pallaitive- high risk for decompensation and rehospitalization- family is aware and will reevaluate goals of care when that occurs   Code Status:   Code Status: Limited: Do not attempt resuscitation (DNR) -DNR-LIMITED -Do Not Intubate/DNI    Prognosis:  < 6 months  Discharge Planning: LTC with Palliative  Care plan was discussed with Dr. Darlyn Eke and family.   Thank you for allowing the Palliative Medicine Team to assist in the care of this patient.  Total time: 60 minutes Prolonged billing:  Time includes:   Preparing to see the patient (e.g., review of tests) Obtaining and/or reviewing separately obtained history Performing a medically necessary appropriate examination and/or evaluation Counseling and educating the patient/family/caregiver Ordering medications, tests, or procedures Referring and communicating with other health care professionals (when not reported separately) Documenting clinical information in the electronic or other health record Independently interpreting results (not reported separately) and communicating results to the patient/family/caregiver Care coordination (not reported  separately) Clinical documentation  Micki Alas, AGNP-C Palliative Medicine   Please contact Palliative Medicine Team phone at [phone] for questions and concerns.

## 2024-02-26 NOTE — Progress Notes (Signed)
  Progress Note   Patient: Yvette Gutierrez ZOX:[id number] DOB: 19-Nov-1945 DOA: 02/18/2024     8 DOS: the patient was seen and examined on 02/26/2024 at at 12:06PM    Brief hospital course: 78 y.o. F with dementia, lives in LTC, bedbound,DM, seizures, hx CVA who was sent from SNF for Klebsiella bacteremia.     Assessment and Plan: Breakthrough seizure in the setting of urinary tract infection -Continue Vimpat  - Continue Rocephin , day 7 of 7  Acute metabolic encephalopathy This appears to be improved back to baseline.  See summary from yesterday  Acute hypoxic respiratory failure due to acute diastolic congestive heart failure Weaned to room air today Echo showed normal EF, grade 1 diastolic dysfunction, no significant valvular disease - Transition from IV to oral Lasix   AKI, resolving Metabolic acidosis Hypokalemia Electrolytes and renal function stable  Diarrhea, presumed C. difficile colonization -Maintain enteric precautions  Normocytic anemia Hemoglobin overall relatively stable  Thrombocytopenia Platelet count resolved  Hypertension Blood pressure slightly high -Continue lisinopril , nifedipine   Type 2 diabetes Glucose remains elevated now that she is eating again - Resume Janumet - Continue sliding scale corrections -Continue Tresiba  Cerebrovascular disease - Continue home simvastatin , Aggrenox   Depression, dementia - Continue  home memantine         Subjective: No new concerns, she is eating and drinking well today.  She has had no fever, respiratory distress, vomiting, pain complaints     Physical Exam: BP (!) 153/59 (BP Location: Right Arm)   Pulse 99   Temp 99.9 F (37.7 C) (Oral)   Resp 20   Ht 5' (1.524 m)   Wt 64 kg   SpO2 94%   BMI 27.56 kg/m   Obese adult female, lying in bed, makes eye contact, feeding herself an icy pop, but makes no intelligible verbalizations, does not follow commands consistently RRR, no murmurs, no  peripheral pitting Respiratory effort shallow, lung sounds diminished, no rales or wheezes appreciated Abdomen soft, no tenderness to palpation No rigidity Attention somewhat distracted, affect blunted, makes eye contact, follows some simple commands, generalized weakness, symmetric strength      Data Reviewed: Discussed with palliative care Sodium 133, potassium 3.3, creatinine stable Glucose elevated White count normal Hemoglobin 8.2, platelets normal Echocardiogram shows normal EF  Family Communication: None present    Disposition: Status is: Inpatient Likely home tomorrow        Author: Ephriam Hashimoto, MD 02/26/2024 4:47 PM  For on call review [email or web address].

## 2024-02-26 NOTE — Plan of Care (Signed)
  Problem: Education: Goal: Ability to describe self-care measures that may prevent or decrease complications (Diabetes Survival Skills Education) will improve Outcome: Not Progressing Goal: Individualized Educational Video(s) Outcome: Not Progressing   Problem: Coping: Goal: Ability to adjust to condition or change in health will improve Outcome: Not Progressing   Problem: Fluid Volume: Goal: Ability to maintain a balanced intake and output will improve Outcome: Not Progressing   Problem: Health Behavior/Discharge Planning: Goal: Ability to identify and utilize available resources and services will improve Outcome: Not Progressing Goal: Ability to manage health-related needs will improve Outcome: Not Progressing   Problem: Metabolic: Goal: Ability to maintain appropriate glucose levels will improve Outcome: Not Progressing   

## 2024-02-27 DIAGNOSIS — R569 Unspecified convulsions: Secondary | ICD-10-CM | POA: Diagnosis not present

## 2024-02-27 LAB — GLUCOSE, CAPILLARY
Glucose-Capillary: 307 mg/dL — ABNORMAL HIGH (ref 70–99)
Glucose-Capillary: 180 mg/dL — ABNORMAL HIGH (ref 70–99)

## 2024-02-27 LAB — BASIC METABOLIC PANEL WITH GFR
Anion gap: 13 (ref 5–15)
BUN: 28 mg/dL — ABNORMAL HIGH (ref 8–23)
CO2: 32 mmol/L (ref 22–32)
Calcium: 8.9 mg/dL (ref 8.9–10.3)
Chloride: 90 mmol/L — ABNORMAL LOW (ref 98–111)
Creatinine, Ser: 1.04 mg/dL — ABNORMAL HIGH (ref 0.44–1.00)
GFR, Estimated: 55 mL/min — ABNORMAL LOW (ref >60)
Glucose, Bld: 309 mg/dL — ABNORMAL HIGH (ref 70–99)
Potassium: 3.7 mmol/L (ref 3.5–5.1)
Sodium: 135 mmol/L (ref 135–145)

## 2024-02-27 LAB — CBC
HCT: 29.3 % — ABNORMAL LOW (ref 36.0–46.0)
Hemoglobin: 8.2 g/dL — ABNORMAL LOW (ref 12.0–15.0)
MCH: 25.9 pg — ABNORMAL LOW (ref 26.0–34.0)
MCHC: 28 g/dL — ABNORMAL LOW (ref 30.0–36.0)
MCV: 92.4 fL (ref 80.0–100.0)
Platelets: 214 10*3/uL (ref 150–400)
RBC: 3.17 MIL/uL — ABNORMAL LOW (ref 3.87–5.11)
RDW: 15.7 % — ABNORMAL HIGH (ref 11.5–15.5)
WBC: 9 10*3/uL (ref 4.0–10.5)
nRBC: 0 % (ref 0.0–0.2)

## 2024-02-27 MED ORDER — TRESIBA FLEXTOUCH 100 UNIT/ML ~~LOC~~ SOPN: 22.0000 [IU] | PEN_INJECTOR | Freq: Every day | SUBCUTANEOUS | Status: AC

## 2024-02-27 MED ORDER — GLUCERNA SHAKE PO LIQD: 237.0000 mL | Freq: Three times a day (TID) | ORAL | Status: AC

## 2024-02-27 MED ORDER — ALBUTEROL SULFATE (2.5 MG/3ML) 0.083% IN NEBU: 2.5000 mg | INHALATION_SOLUTION | Freq: Four times a day (QID) | RESPIRATORY_TRACT | 12 refills | Status: AC | PRN

## 2024-02-27 MED ORDER — FUROSEMIDE 20 MG PO TABS: 20.0000 mg | ORAL_TABLET | Freq: Every day | ORAL | Status: AC

## 2024-02-27 NOTE — Progress Notes (Signed)
 Report received from C. Database administrator. No change in assessment. Continue plan of care. Cambrey Lupi, Connell Degree, RN

## 2024-02-27 NOTE — Discharge Summary (Signed)
 Physician Discharge Summary   Patient: Yvette Gutierrez MRN: [id number] DOB: October 03, 1946  Admit date:     02/18/2024  Discharge date: 02/27/24  Discharge Physician: Ephriam Hashimoto   PCP: Patient, No Pcp Per     Recommendations at discharge:  Consult Palliative Care to follow at SNF Wean oxygen in coming weeks if able Add new Lasix  20 mg daily and check BMP and CBC in 1 week (discharge Cr 1.0, K 3.7, Hgb 8.2) Increase Tresiba and titrate antiglycemics as appropriate     Discharge Diagnoses: Principal Problem:   Sepsis due to Klebsiella bacteremia due to UTI Active Problems:   Breakthrough seizure due to sepsis [facility], [place])   Acute metabolic encephalopathy   Acute respiratory failure with hypoxia due to CHF (HCC)   Acute on chronic diastolic congestive heart failure   Vascular dementia without behavioral disturbance (HCC)   Bedbound   Major depression, chronic   Essential hypertension   Neuropathy due to secondary diabetes (HCC)   Hyperlipidemia LDL goal <70   Diabetic retinopathy (HCC)   OSA (obstructive sleep apnea)   DM type 2 with diabetic peripheral neuropathy (HCC)   Seizure disorder (HCC)   CKD (chronic kidney disease) stage 3a, GFR 30-59 ml/min (HCC)   History of cardioembolic cerebrovascular accident (CVA)   Acute metabolic acidosis   Hypokalemia   Normocytic anemia   Thrombocytopenia    Hospital Course: 78 y.o. F with dementia, lives in LTC, bedbound,DM, seizures, hx CVA who was sent from SNF for convulsions, lethargy.   Per report, patient with seizure-like activity 1 week prior to admission, which resolved by itself, required no workup.   On day of admission, she had another GTC seizure, fever 102.7, so was sent to the ER.   Here, diagnosed with UTI, started on IV Rocephin , anti-epileptics.   Since admission, the patient has remained poorly responsive, she has had 2 episodes of agonal breathing, the first responded to Keppra , antibiotics, the second (on  5/13) required required new O2.  CTA showed no PE, but did show pleural effusions and edema.     Sepsis with end organ damage due to Klebsiella bacteremia due to UTI Presented with fever, tachycardia, encephalopathy and reduced responsiveness, lactic acid 3.7, AKI with creatinine 1.6, thrombocytopenia with platelets dropping 192 to 111K.  Due to UTI.  Treated with Rocephin , 7 days, fever free, HR and mentation normalized.   Breakthrough seizure due to UTI sepsis Seizures noted on admission.  Lamictal level normal.  Neurology consulted.  Started on Keppra  and home Lamictal  CTH showed atrophy only, no acute change.  Ultimately felt to have breakthrough seizure due to sepsis and narrowed to home Lamictal.  No further seizures noted.   Acute metabolic encephalopathy Dementia Failure to thrive Palliative care were consulted and Hospice was discussed with family.  She had very limited recovery with aggressive treatment in the hospital with IV antibiotics and antiepileptics.  Samuel Crock discussion of poor prognosis, high likelihood of recurrence and high likelihood of further deterioration was had.  Family understand.  They wish to continue aggressive cares as they perceive the patient has a good quality of life.  They asked for outpatient Paliative care services to continue for continued discussions of goals of care and life trajectory.   Acute hypoxic respiratory failure due to acute diastolic congestive heart failure Developed hypoxia after seizures.  CXR showed new bilateral infiltrates c/w edema.  Started on IV Lasix  and diuresed to room air.  Not consistnetly on room air,  likely atelectasis.  Repeat CXR improved. Echo showed normal EF, normal valves.   Discharged on oral Lasix  once daily.  Recommend follow up BMP In 1 week, wean O2 as able with Incentive spirometry as able (limited by dementia).  Overall prognosis estimated unlikely to wean off O2.      AKI  Cr up to 1.8 with sepsis.   Improved to baseine 1-1.1 with fluids.      Normocytic anemia Hemoglobin trending down to 8.5, no clinical bleeding observed or reported - Trend Hgb outpatient   Type 2 diabetes Glucose labile here, trended up with Ensure.  - Stop ENsure - Replace with Glucerna - Increase home [place] - Continue home Janumet and SS corrections - Titrate insulin  as appropriate     Cerebrovascular disease Stable simvastatin , Aggrenox    Depression, dementia No change on memantine               The [place]  Controlled Substances Registry was reviewed for this patient prior to discharge.  Consultants: Neurology Palliative Care   Disposition: Long term care facility Diet recommendation: Dysphagia 2, chopped with thin liquids and Diabetic restrictions   DISCHARGE MEDICATION: Allergies as of 02/27/2024   No Known Allergies      Medication List     TAKE these medications    Acetaminophen  500 MG capsule Take 1,000 mg by mouth in the morning and at bedtime.   albuterol  (2.5 MG/3ML) 0.083% nebulizer solution Commonly known as: PROVENTIL  Take 3 mLs (2.5 mg total) by nebulization every 6 (six) hours as needed for wheezing or shortness of breath.   b complex vitamins capsule Take 1 capsule by mouth daily.   Biofreeze Cool The Pain 4 % Gel Generic drug: Menthol  (Topical Analgesic) Apply 1 application  topically daily. Bilateral shoulders/upper arms-pain   bisacodyl  10 MG suppository Commonly known as: DULCOLAX Place 1 suppository (10 mg total) rectally daily as needed for moderate constipation.   dipyridamole -aspirin  200-25 MG 12hr capsule Commonly known as: AGGRENOX  Take 1 capsule by mouth 2 (two) times daily.   feeding supplement (GLUCERNA SHAKE) Liqd Take 237 mLs by mouth 3 (three) times daily between meals.   ferrous sulfate  325 (65 FE) MG tablet Take 1 tablet (325 mg total) by mouth 3 (three) times daily after meals. What changed: when to take this   furosemide   20 MG tablet Commonly known as: LASIX  Take 1 tablet (20 mg total) by mouth daily.   Janumet 50-1000 MG tablet Generic drug: sitaGLIPtin-metformin  Take 1 tablet by mouth 2 (two) times daily.   lacosamide  50 MG Tabs tablet Commonly known as: VIMPAT  Take one tablet by mouth every 12 hours   lisinopril  5 MG tablet Commonly known as: ZESTRIL  Take 5 mg by mouth daily.   memantine  10 MG tablet Commonly known as: NAMENDA  Take 10 mg by mouth 2 (two) times daily.   NIFEdipine  30 MG 24 hr tablet Commonly known as: PROCARDIA -XL/NIFEDICAL-XL Take 30 mg by mouth daily.   NovoLOG  100 UNIT/ML injection Generic drug: insulin  aspart Inject 0-12 Units into the skin as directed. If BS<70, CALL NP/PA. If BS is 70-200=0 units, 201-250=2 units, 251-300=4 units, 301-350=6 units, 351-400=8 units, 401-450=10 units, 451-600=12 units, IF CBG>450 GIVE THE 12 UNITS, RECHECK IN 2 HRS, IF STILL>350 NOTIFY PROVIDER   sennosides-docusate sodium  8.6-50 MG tablet Commonly known as: SENOKOT-S Take 2 tablets by mouth at bedtime.   simvastatin  10 MG tablet Commonly known as: ZOCOR  Take 10 mg by mouth at bedtime.   Horace Lye FlexTouch 100  UNIT/ML FlexTouch Pen Generic drug: insulin  degludec Inject 22 Units into the skin daily. What changed: how much to take         Discharge Instructions     Increase activity slowly   Complete by: As directed    No wound care   Complete by: As directed        Discharge Exam: Filed Weights   02/18/24 2215 02/20/24 1200  Weight: 64.5 kg 64 kg    General: Pt is  awake, makes eye contact, murmurs something incoherent in response to greeting, not in acute distress, lying in bed Cardiovascular: RRR, nl S1-S2, no murmurs appreciated.   No LE edema.  JVP not visible Respiratory: Normal respiratory rate and rhythm.  CTAB without rales or wheezes.  But very diminished due to effort and limited exam Abdominal: Abdomen soft and non-tender.  No distension or HSM.    Neuro/Psych: Strength symmetric in upper and lower extremities.  Judgment and insight appear severely impaired buyt at baseline.   Condition at discharge: poor  The results of significant diagnostics from this hospitalization (including imaging, microbiology, ancillary and laboratory) are listed below for reference.   Imaging Studies: ECHOCARDIOGRAM COMPLETE Result Date: 02/25/2024    ECHOCARDIOGRAM REPORT   Patient Name:   BREEZE BERRINGER Date of Exam: 02/25/2024 Medical Rec #:  [id number]     Height:       60.0 in Accession #:    [id number]    Weight:       141.1 lb Date of Birth:  1946-01-04     BSA:          1.609 m Patient Age:    77 years      BP:           159/76 mmHg Patient Gender: F             HR:           79 bpm. Exam Location:  Inpatient Procedure: 2D Echo, Cardiac Doppler and Color Doppler (Both Spectral and Color            Flow Doppler were utilized during procedure). Indications:    CHF-acute diastolic  History:        Patient has no prior history of Echocardiogram examinations.                 Risk Factors:Hypertension, Sleep Apnea and Diabetes. CKD stage                 3.  Sonographer:    Aldon Ambrosia Referring Phys: [id number] Kristle Wesch P Aydon Swamy IMPRESSIONS  1. Left ventricular ejection fraction, by estimation, is 55 to 60%. The left ventricle has normal function. Left ventricular endocardial border not optimally defined to evaluate regional wall motion. Left ventricular diastolic parameters are consistent with Grade I diastolic dysfunction (impaired relaxation).  2. Right ventricular systolic function was not well visualized. The right ventricular size is normal.  3. Left atrial size was moderately dilated.  4. The mitral valve is normal in structure. No evidence of mitral valve regurgitation. No evidence of mitral stenosis.  5. The aortic valve is normal in structure. Aortic valve regurgitation is not visualized. No aortic stenosis is present.  6. The inferior vena cava is normal in  size with greater than 50% respiratory variability, suggesting right atrial pressure of 3 mmHg. FINDINGS  Left Ventricle: Left ventricular ejection fraction, by estimation, is 55 to 60%. The left ventricle has normal function. Left ventricular endocardial  border not optimally defined to evaluate regional wall motion. The left ventricular internal cavity size was normal in size. There is no left ventricular hypertrophy. Left ventricular diastolic parameters are consistent with Grade I diastolic dysfunction (impaired relaxation). Normal left ventricular filling pressure. Right Ventricle: The right ventricular size is normal. No increase in right ventricular wall thickness. Right ventricular systolic function was not well visualized. Left Atrium: Left atrial size was moderately dilated. Right Atrium: Right atrial size was normal in size. Pericardium: There is no evidence of pericardial effusion. Mitral Valve: The mitral valve is normal in structure. Mild mitral annular calcification. No evidence of mitral valve regurgitation. No evidence of mitral valve stenosis. MV peak gradient, 5.2 mmHg. The mean mitral valve gradient is 2.0 mmHg. Tricuspid Valve: The tricuspid valve is normal in structure. Tricuspid valve regurgitation is not demonstrated. No evidence of tricuspid stenosis. Aortic Valve: The aortic valve is normal in structure. Aortic valve regurgitation is not visualized. No aortic stenosis is present. Aortic valve mean gradient measures 2.0 mmHg. Aortic valve peak gradient measures 4.6 mmHg. Aortic valve area, by VTI measures 2.23 cm. Pulmonic Valve: The pulmonic valve was normal in structure. Pulmonic valve regurgitation is not visualized. No evidence of pulmonic stenosis. Aorta: The aortic root is normal in size and structure. Venous: The inferior vena cava is normal in size with greater than 50% respiratory variability, suggesting right atrial pressure of 3 mmHg. IAS/Shunts: The interatrial septum appears to  be lipomatous. No atrial level shunt detected by color flow Doppler.  LEFT VENTRICLE PLAX 2D LVIDd:         3.40 cm      Diastology LVIDs:         2.10 cm      LV e' medial:    6.42 cm/s LV PW:         1.00 cm      LV E/e' medial:  12.1 LV IVS:        0.80 cm      LV e' lateral:   5.55 cm/s LVOT diam:     1.80 cm      LV E/e' lateral: 14.0 LV SV:         46 LV SV Index:   28 LVOT Area:     2.54 cm  LV Volumes (MOD) LV vol d, MOD A2C: 105.0 ml LV vol d, MOD A4C: 108.0 ml LV vol s, MOD A2C: 47.4 ml LV vol s, MOD A4C: 47.4 ml LV SV MOD A2C:     57.6 ml LV SV MOD A4C:     108.0 ml LV SV MOD BP:      60.7 ml RIGHT VENTRICLE            IVC RV Basal diam:  3.20 cm    IVC diam: 0.80 cm RV Mid diam:    2.10 cm RV S prime:     8.81 cm/s TAPSE (M-mode): 1.3 cm LEFT ATRIUM             Index        RIGHT ATRIUM          Index LA diam:        3.30 cm 2.05 cm/m   RA Area:     9.21 cm LA Vol (A2C):   66.9 ml 41.57 ml/m  RA Volume:   18.20 ml 11.31 ml/m LA Vol (A4C):   68.1 ml 42.31 ml/m LA Biplane Vol: 70.3 ml 43.68 ml/m  AORTIC VALVE  PULMONIC VALVE AV Area (Vmax):    2.13 cm     PV Vmax:       1.02 m/s AV Area (Vmean):   1.86 cm     PV Peak grad:  4.2 mmHg AV Area (VTI):     2.23 cm AV Vmax:           107.00 cm/s AV Vmean:          68.500 cm/s AV VTI:            0.204 m AV Peak Grad:      4.6 mmHg AV Mean Grad:      2.0 mmHg LVOT Vmax:         89.70 cm/s LVOT Vmean:        50.000 cm/s LVOT VTI:          0.179 m LVOT/AV VTI ratio: 0.88  AORTA Ao Root diam: 3.50 cm Ao Asc diam:  3.30 cm MITRAL VALVE MV Area (PHT): 2.69 cm     SHUNTS MV Area VTI:   1.76 cm     Systemic VTI:  0.18 m MV Peak grad:  5.2 mmHg     Systemic Diam: 1.80 cm MV Mean grad:  2.0 mmHg MV Vmax:       1.14 m/s MV Vmean:      73.5 cm/s MV Decel Time: 282 msec MV E velocity: 77.80 cm/s MV A velocity: 113.00 cm/s MV E/A ratio:  0.69 Gaylyn Keas MD Electronically signed by Gaylyn Keas MD Signature Date/Time: 02/25/2024/5:05:01 PM     Final    DG CHEST PORT 1 VIEW Result Date: 02/25/2024 CLINICAL DATA:  Hypoxia EXAM: PORTABLE CHEST 1 VIEW COMPARISON:  Feb 23, 2024 FINDINGS: Accounting for differences in techniques there is persistent bilateral reticular interstitial infiltrates with some atelectatic volume loss changes of the right upper lobe. Small left effusion unchanged since prior examination with minimal left lower lobe hypoventilatory atelectasis Heart appears slightly enlarged left ventricular configuration with a ectatic calcified thoracic aorta IMPRESSION: Mild residual bilateral congestive changes with hypoventilatory atelectatic changes but interval improvement aeration both lungs Electronically Signed   By: Fredrich Jefferson M.D.   On: 02/25/2024 08:19   CT Angio Chest Pulmonary Embolism (PE) W or WO Contrast Result Date: 02/23/2024 CLINICAL DATA:  Pleural effusion, known or suspected (Ped 0-17y) respiratory distress EXAM: CT ANGIOGRAPHY CHEST WITH CONTRAST TECHNIQUE: Multidetector CT imaging of the chest was performed using the standard protocol during bolus administration of intravenous contrast. Multiplanar CT image reconstructions and MIPs were obtained to evaluate the vascular anatomy. RADIATION DOSE REDUCTION: This exam was performed according to the departmental dose-optimization program which includes automated exposure control, adjustment of the mA and/or kV according to patient size and/or use of iterative reconstruction technique. CONTRAST:  80mL OMNIPAQUE  IOHEXOL  350 MG/ML SOLN COMPARISON:  Same day chest radiograph dated 02/23/2024 at 10:52 a.m. FINDINGS: Cardiovascular: Satisfactory opacification of the pulmonary arteries to the segmental level, although evaluation is limited due to motion degradation. No evidence of pulmonary embolism. Cardiomegaly. No pericardial effusion. Multivessel coronary artery calcifications. Nonaneurysmal thoracic aorta with atherosclerotic calcification. Mediastinum/Nodes: No enlarged  mediastinal, hilar, or axillary lymph nodes. 1.5 cm hypodense nodule in the right thyroid gland. 1.2 cm hypodense nodule in the left thyroid gland. Esophagus is grossly unremarkable. Lungs/Pleura: Motion degradation limits the sensitivity of the exam. Moderate-sized bilateral pleural effusions, greater on the right, with associated compressive atelectasis of the left and right lower lobes. Interlobular septal thickening, may be secondary to  interstitial edema. No pneumothorax. Upper Abdomen: No acute abnormality. Musculoskeletal: Diffuse osseous demineralization. Chronic compression deformity of the T11 vertebral body is again noted. Degenerative changes of the spine. Chronic appearing posttraumatic deformities of the bilateral proximal humeri with advanced degenerative changes of the glenohumeral joints. Review of the MIP images confirms the above findings. IMPRESSION: 1. Moderate quality evaluation for pulmonary embolism. No pulmonary embolism identified. 2. Moderate-sized bilateral pleural effusions, greater on the right, with associated compressive atelectasis. Underlying infiltrate or mass can not be entirely excluded. 3. Cardiomegaly with interlobular septal thickening, suggestive of interstitial edema/CHF exacerbation. 4. Multivessel coronary artery calcifications. 5.  Aortic Atherosclerosis (ICD10-I70.0). Electronically Signed   By: Mannie Seek M.D.   On: 02/23/2024 13:51   DG CHEST PORT 1 VIEW Result Date: 02/23/2024 CLINICAL DATA:  [id number] Dyspnea [id number] EXAM: PORTABLE CHEST 1 VIEW COMPARISON:  06/15/2021. FINDINGS: There are new heterogeneous opacities overlying the right mid lung zone, which may represent consolidation versus atelectasis. There is also left retrocardiac opacity obscuring the left medial hemidiaphragm, descending thoracic aorta and blunting the left lateral costophrenic angle, which may represent left lung atelectasis and/or consolidation with pleural effusion. There is subtle  blunting of right lateral costophrenic angle as well, which may represent trace right pleural effusion. Bilateral lung fields are otherwise clear. No frank pulmonary edema. Stable cardio-mediastinal silhouette. No acute osseous abnormalities. The soft tissues are within normal limits. IMPRESSION: *New heterogeneous opacities overlying the right mid lung zone, which may represent consolidation versus atelectasis. Follow-up to clearing is recommended. *There is also left retrocardiac opacity obscuring the left medial hemidiaphragm, descending thoracic aorta and blunting the left lateral costophrenic angle, which may represent left lung atelectasis and/or consolidation with pleural effusion. Electronically Signed   By: Beula Brunswick M.D.   On: 02/23/2024 10:58   CT HEAD WO CONTRAST ( ) Result Date: 02/20/2024 CLINICAL DATA:  Initial evaluation for new onset seizure. EXAM: CT HEAD WITHOUT CONTRAST TECHNIQUE: Contiguous axial images were obtained from the base of the skull through the vertex without intravenous contrast. RADIATION DOSE REDUCTION: This exam was performed according to the departmental dose-optimization program which includes automated exposure control, adjustment of the mA and/or kV according to patient size and/or use of iterative reconstruction technique. COMPARISON:  Prior study from 09/22/2014. FINDINGS: Brain: Diffuse prominence of the CSF containing spaces compatible generalized cerebral atrophy. Patchy and confluent hypodensity involving the supratentorial cerebral white matter, consistent with chronic small vessel ischemic disease, advanced in nature. Multiple remote lacunar infarcts present about the deep gray nuclei. No acute intracranial hemorrhage. No acute large vessel territory infarct. No mass lesion or midline shift. Ventricular prominence related to global parenchymal volume loss of hydrocephalus. No extra-axial fluid collection. Vascular: No abnormal hyperdense vessel. Calcified  atherosclerosis present at skull base. Skull: Scalp soft tissues demonstrate no acute finding. Calvarium intact. Sinuses/Orbits: Globes orbital soft tissues within normal limits. Paranasal sinuses are largely clear. No significant mastoid effusion. Other: None. IMPRESSION: 1. No acute intracranial abnormality. 2. Advanced cerebral atrophy with chronic small vessel ischemic disease, with multiple remote lacunar infarcts about the deep gray nuclei. Electronically Signed   By: Virgia Griffins M.D.   On: 02/20/2024 19:15   EEG adult Result Date: 02/19/2024 Arleene Lack, MD     02/19/2024  8:00 PM Patient Name: Yvette Gutierrez MRN: [id number] Epilepsy Attending: Arleene Lack Referring Physician/Provider: Hoyt Macleod, MD Date: 02/19/2024 Duration: 23.14 mins Patient history: 78yo F with seizure in setting of UTI. EEG to evaluate for seizure.  Level of alertness: Awake AEDs during EEG study: LCM Technical aspects: This EEG study was done with scalp electrodes positioned according to the 10-20 International system of electrode placement. Electrical activity was reviewed with band pass filter of 1-70Hz , sensitivity of 7 uV/mm, display speed of 75mm/sec with a 60Hz  notched filter applied as appropriate. EEG data were recorded continuously and digitally stored.  Video monitoring was available and reviewed as appropriate. Description: The posterior dominant rhythm consists of 8 Hz activity of moderate voltage (25-35 uV) seen predominantly in posterior head regions, symmetric and reactive to eye opening and eye closing. Hyperventilation and photic stimulation were not performed.   IMPRESSION: This study is within normal limits. No seizures or epileptiform discharges were seen throughout the recording. A normal interictal EEG does not exclude the diagnosis of epilepsy. Arleene Lack    Microbiology: Results for orders placed or performed during the hospital encounter of 02/18/24  Culture, blood (routine x 2)      Status: Abnormal   Collection Time: 02/18/24  5:10 PM   Specimen: BLOOD LEFT ARM  Result Value Ref Range Status   Specimen Description   Final    BLOOD LEFT ARM Performed at [facility], [address] [place], [place] [address]    Special Requests   Final    BOTTLES DRAWN AEROBIC AND ANAEROBIC Blood Culture results may not be optimal due to an inadequate volume of blood received in culture bottles Performed at [facility] [address] [place], [place] [address]    Culture  Setup Time   Final    GRAM NEGATIVE RODS ANAEROBIC BOTTLE ONLY CRITICAL RESULT CALLED TO, READ BACK BY AND VERIFIED WITH: M LILLISTON,PHARMD@[id number]  02/23/24 MK Performed at [facility], [address], [place], [place] [address]    Culture KLEBSIELLA PNEUMONIAE (A)  Final   Report Status 02/25/2024 FINAL  Final   Organism ID, Bacteria KLEBSIELLA PNEUMONIAE  Final   Organism ID, Bacteria KLEBSIELLA PNEUMONIAE  Final      Susceptibility   Klebsiella pneumoniae - MIC*    AMPICILLIN >=32 RESISTANT Resistant     CEFEPIME  <=0.12 SENSITIVE Sensitive     CEFTAZIDIME <=1 SENSITIVE Sensitive     CEFTRIAXONE  <=0.25 SENSITIVE Sensitive     CIPROFLOXACIN <=0.25 SENSITIVE Sensitive     GENTAMICIN <=1 SENSITIVE Sensitive     IMIPENEM <=0.25 SENSITIVE Sensitive     TRIMETH/SULFA <=20 SENSITIVE Sensitive     AMPICILLIN/SULBACTAM 4 SENSITIVE Sensitive     PIP/TAZO <=4 SENSITIVE Sensitive ug/mL   Klebsiella pneumoniae - KIRBY BAUER*    CEFAZOLIN  SENSITIVE Sensitive     * KLEBSIELLA PNEUMONIAE    KLEBSIELLA PNEUMONIAE  Blood Culture ID Panel (Reflexed)     Status: Abnormal   Collection Time: 02/18/24  5:10 PM  Result Value Ref Range Status   Enterococcus faecalis NOT DETECTED NOT DETECTED Final   Enterococcus Faecium NOT DETECTED NOT DETECTED Final   Listeria monocytogenes NOT DETECTED NOT DETECTED Final   Staphylococcus species NOT DETECTED NOT DETECTED Final   Staphylococcus aureus  (BCID) NOT DETECTED NOT DETECTED Final   Staphylococcus epidermidis NOT DETECTED NOT DETECTED Final   Staphylococcus lugdunensis NOT DETECTED NOT DETECTED Final   Streptococcus species NOT DETECTED NOT DETECTED Final   Streptococcus agalactiae NOT DETECTED NOT DETECTED Final   Streptococcus pneumoniae NOT DETECTED NOT DETECTED Final   Streptococcus pyogenes NOT DETECTED NOT DETECTED Final   A.calcoaceticus-baumannii NOT DETECTED NOT DETECTED Final   Bacteroides  fragilis NOT DETECTED NOT DETECTED Final   Enterobacterales DETECTED (A) NOT DETECTED Final    Comment: Enterobacterales represent a large order of gram negative bacteria, not a single organism. CRITICAL RESULT CALLED TO, READ BACK BY AND VERIFIED WITH: M LILLISTON,PHARMD@[id number]  02/23/24 MK    Enterobacter cloacae complex NOT DETECTED NOT DETECTED Final   Escherichia coli NOT DETECTED NOT DETECTED Final   Klebsiella aerogenes NOT DETECTED NOT DETECTED Final   Klebsiella oxytoca NOT DETECTED NOT DETECTED Final   Klebsiella pneumoniae DETECTED (A) NOT DETECTED Final    Comment: CRITICAL RESULT CALLED TO, READ BACK BY AND VERIFIED WITH: M LILLISTON,PHARMD@[id number]  02/23/24 mk    Proteus species NOT DETECTED NOT DETECTED Final   Salmonella species NOT DETECTED NOT DETECTED Final   Serratia marcescens NOT DETECTED NOT DETECTED Final   Haemophilus influenzae NOT DETECTED NOT DETECTED Final   Neisseria meningitidis NOT DETECTED NOT DETECTED Final   Pseudomonas aeruginosa NOT DETECTED NOT DETECTED Final   Stenotrophomonas maltophilia NOT DETECTED NOT DETECTED Final   Candida albicans NOT DETECTED NOT DETECTED Final   Candida auris NOT DETECTED NOT DETECTED Final   Candida glabrata NOT DETECTED NOT DETECTED Final   Candida krusei NOT DETECTED NOT DETECTED Final   Candida parapsilosis NOT DETECTED NOT DETECTED Final   Candida tropicalis NOT DETECTED NOT DETECTED Final   Cryptococcus neoformans/gattii NOT DETECTED NOT DETECTED Final   CTX-M  ESBL NOT DETECTED NOT DETECTED Final   Carbapenem resistance IMP NOT DETECTED NOT DETECTED Final   Carbapenem resistance KPC NOT DETECTED NOT DETECTED Final   Carbapenem resistance NDM NOT DETECTED NOT DETECTED Final   Carbapenem resist OXA 48 LIKE NOT DETECTED NOT DETECTED Final   Carbapenem resistance VIM NOT DETECTED NOT DETECTED Final    Comment: Performed at [facility], [address], [place], [place] [address]  Culture, blood (routine x 2)     Status: None   Collection Time: 02/18/24  5:15 PM   Specimen: BLOOD LEFT HAND  Result Value Ref Range Status   Specimen Description   Final    BLOOD LEFT HAND Performed at [facility], [address], [place], [place] [address]    Special Requests   Final    BOTTLES DRAWN AEROBIC AND ANAEROBIC Blood Culture results may not be optimal due to an inadequate volume of blood received in culture bottles Performed at [facility], [address] [place], [place] [address]    Culture   Final    NO GROWTH 5 DAYS Performed at [facility], [address], [place], [place] [address]    Report Status 02/23/2024 FINAL  Final  Resp panel by RT-PCR (RSV, Flu A&B, Covid) Anterior Nasal Swab     Status: None   Collection Time: 02/18/24  5:37 PM   Specimen: Anterior Nasal Swab  Result Value Ref Range Status   SARS Coronavirus 2 by RT PCR NEGATIVE NEGATIVE Final    Comment: (NOTE) SARS-CoV-2 target nucleic acids are NOT DETECTED.  The SARS-CoV-2 RNA is generally detectable in upper respiratory specimens during the acute phase of infection. The lowest concentration of SARS-CoV-2 viral copies this assay can detect is 138 copies/mL. A negative result does not preclude SARS-Cov-2 infection and should not be used as the sole basis for treatment or other patient management decisions. A negative result may occur with  improper specimen collection/handling, submission of specimen other than nasopharyngeal swab,  presence of viral mutation(s) within the areas targeted by  this assay, and inadequate number of viral copies(<138 copies/mL). A negative result must be combined with clinical observations, patient history, and epidemiological information. The expected result is Negative.  Fact Sheet for Patients:  BloggerCourse.com  Fact Sheet for Healthcare Providers:  SeriousBroker.it  This test is no t yet approved or cleared by the [place]  FDA and  has been authorized for detection and/or diagnosis of SARS-CoV-2 by FDA under an Emergency Use Authorization (EUA). This EUA will remain  in effect (meaning this test can be used) for the duration of the COVID-19 declaration under Section 564(b)(1) of the Act, 21 U.S.C.section 360bbb-3(b)(1), unless the authorization is terminated  or revoked sooner.       Influenza A by PCR NEGATIVE NEGATIVE Final   Influenza B by PCR NEGATIVE NEGATIVE Final    Comment: (NOTE) The Xpert Xpress SARS-CoV-2/FLU/RSV plus assay is intended as an aid in the diagnosis of influenza from Nasopharyngeal swab specimens and should not be used as a sole basis for treatment. Nasal washings and aspirates are unacceptable for Xpert Xpress SARS-CoV-2/FLU/RSV testing.  Fact Sheet for Patients: BloggerCourse.com  Fact Sheet for Healthcare Providers: SeriousBroker.it  This test is not yet approved or cleared by the [place]  FDA and has been authorized for detection and/or diagnosis of SARS-CoV-2 by FDA under an Emergency Use Authorization (EUA). This EUA will remain in effect (meaning this test can be used) for the duration of the COVID-19 declaration under Section 564(b)(1) of the Act, 21 U.S.C. section 360bbb-3(b)(1), unless the authorization is terminated or revoked.     Resp Syncytial Virus by PCR NEGATIVE NEGATIVE Final    Comment: (NOTE) Fact Sheet for  Patients: BloggerCourse.com  Fact Sheet for Healthcare Providers: SeriousBroker.it  This test is not yet approved or cleared by the [place]  FDA and has been authorized for detection and/or diagnosis of SARS-CoV-2 by FDA under an Emergency Use Authorization (EUA). This EUA will remain in effect (meaning this test can be used) for the duration of the COVID-19 declaration under Section 564(b)(1) of the Act, 21 U.S.C. section 360bbb-3(b)(1), unless the authorization is terminated or revoked.  Performed at [facility], [address], [place], [place] [address]   Urine Culture     Status: Abnormal   Collection Time: 02/18/24  5:50 PM   Specimen: Urine, Random  Result Value Ref Range Status   Specimen Description   Final    URINE, RANDOM Performed at [facility], [address], [place], [place] [address]    Special Requests   Final    NONE Reflexed from [id number] Performed at [facility], [address], [place], [place] [address]    Culture >=100,000 COLONIES/mL KLEBSIELLA PNEUMONIAE (A)  Final   Report Status 02/22/2024 FINAL  Final   Organism ID, Bacteria KLEBSIELLA PNEUMONIAE (A)  Final      Susceptibility   Klebsiella pneumoniae - MIC*    AMPICILLIN >=32 RESISTANT Resistant     CEFAZOLIN  <=4 SENSITIVE Sensitive     CEFEPIME  <=0.12 SENSITIVE Sensitive     CEFTRIAXONE  <=0.25 SENSITIVE Sensitive     CIPROFLOXACIN <=0.25 SENSITIVE Sensitive     GENTAMICIN <=1 SENSITIVE Sensitive     IMIPENEM <=0.25 SENSITIVE Sensitive     NITROFURANTOIN 64 INTERMEDIATE Intermediate     TRIMETH/SULFA <=20 SENSITIVE Sensitive     AMPICILLIN/SULBACTAM 4 SENSITIVE Sensitive     PIP/TAZO <=4 SENSITIVE Sensitive ug/mL    * >=100,000 COLONIES/mL KLEBSIELLA PNEUMONIAE  MRSA Next Gen  by PCR, Nasal     Status: None   Collection Time: 02/19/24 12:24 PM   Specimen: Nasal Mucosa; Nasal Swab   Result Value Ref Range Status   MRSA by PCR Next Gen NOT DETECTED NOT DETECTED Final    Comment: (NOTE) The GeneXpert MRSA Assay (FDA approved for NASAL specimens only), is one component of a comprehensive MRSA colonization surveillance program. It is not intended to diagnose MRSA infection nor to guide or monitor treatment for MRSA infections. Test performance is not FDA approved in patients less than 47 years old. Performed at [facility], [address] [place], [place] [address]   Gastrointestinal Panel by PCR , Stool     Status: None   Collection Time: 02/22/24  2:52 PM   Specimen: Stool  Result Value Ref Range Status   Campylobacter species NOT DETECTED NOT DETECTED Final   Plesimonas shigelloides NOT DETECTED NOT DETECTED Final   Salmonella species NOT DETECTED NOT DETECTED Final   Yersinia enterocolitica NOT DETECTED NOT DETECTED Final   Vibrio species NOT DETECTED NOT DETECTED Final   Vibrio cholerae NOT DETECTED NOT DETECTED Final   Enteroaggregative E coli (EAEC) NOT DETECTED NOT DETECTED Final   Enteropathogenic E coli (EPEC) NOT DETECTED NOT DETECTED Final   Enterotoxigenic E coli (ETEC) NOT DETECTED NOT DETECTED Final   Shiga like toxin producing E coli (STEC) NOT DETECTED NOT DETECTED Final   Shigella/Enteroinvasive E coli (EIEC) NOT DETECTED NOT DETECTED Final   Cryptosporidium NOT DETECTED NOT DETECTED Final   Cyclospora cayetanensis NOT DETECTED NOT DETECTED Final   Entamoeba histolytica NOT DETECTED NOT DETECTED Final   Giardia lamblia NOT DETECTED NOT DETECTED Final   Adenovirus F40/41 NOT DETECTED NOT DETECTED Final   Astrovirus NOT DETECTED NOT DETECTED Final   Norovirus GI/GII NOT DETECTED NOT DETECTED Final   Rotavirus A NOT DETECTED NOT DETECTED Final   Sapovirus (I, II, IV, and V) NOT DETECTED NOT DETECTED Final    Comment: Performed at [facility], [address] [place], [place] [address]    Labs: CBC: Recent Labs   Lab 02/23/24 0304 02/24/24 0320 02/25/24 0301 02/26/24 0458 02/27/24 0808  WBC 10.3 8.7 8.5 8.0 9.0  HGB 9.4* 9.7* 8.5* 8.2* 8.2*  HCT 32.3* 34.9* 30.4* 28.7* 29.3*  MCV 89.2 93.3 93.8 92.0 92.4  PLT 144* 161 178 186 214   Basic Metabolic Panel: Recent Labs  Lab 02/20/24 1628 02/21/24 0310 02/22/24 1247 02/23/24 0304 02/23/24 2339 02/24/24 0320 02/25/24 0301 02/26/24 0458 02/27/24 0808  NA 139   < > 140 138 139 142 136 133* 135  K 3.6   < > 3.4* 3.0* 4.0 3.3* 3.0* 3.3* 3.7  CL 113*   < > 97* 98 100 101 95* 90* 90*  CO2 12*   < > 28 31 30 28 31  33* 32  GLUCOSE 298*   < > 275* 74 130* 97 248* 375* 309*  BUN 31*   < > 26* 22 21 21  24* 25* 28*  CREATININE 1.63*   < > 1.26* 1.20* 1.15* 1.21* 0.80 1.09* 1.04*  CALCIUM 8.0*   < > 8.1* 7.6* 8.0* 8.1* 8.1* 8.3* 8.9  MG 1.4*  --  2.2 1.8  --   --   --   --   --    < > = values in this interval not displayed.   Liver Function Tests: Recent Labs  Lab 02/25/24 0301  AST 15  ALT 7  ALKPHOS  78  BILITOT 0.2  PROT 5.6*  ALBUMIN 2.2*   CBG: Recent Labs  Lab 02/26/24 0729 02/26/24 1131 02/26/24 1633 02/26/24 2221 02/27/24 0728  GLUCAP 292* 359* 394* 485* 307*    Discharge time spent: approximately 45 minutes spent on discharge counseling, evaluation of patient on day of discharge, and coordination of discharge planning with nursing, social work, pharmacy and case management  Signed: Ephriam Hashimoto, MD [facility] 02/27/2024

## 2024-02-27 NOTE — Progress Notes (Signed)
 Daily Progress Note   Patient Name: Yvette Gutierrez       Date: 02/27/2024 DOB: 07/25/1946  Age: 78 y.o. MRN#: [id number] Attending Physician: Ephriam Hashimoto, * Primary Care Physician: Patient, No Pcp Per Admit Date: 02/18/2024  Reason for Consultation/Follow-up: Establishing goals of care  Patient Profile/HPI: 78 y.o. female  with past medical history of advanced dementia, DM2, hypertension, hyperlipidemia, CVA, depression, seizure disorder, anemia, bedbound admitted on 02/18/2024 with seizure and lethargy.    During ED workup, patient was also noted to have UTI and sepsis.  Per neurology, breakthrough seizure is likely due to infectious process lowering seizure threshold, compounded by seizure medications not received for around 24 hours while inpatient.    Also noted to have blood cultures positive for klebsiella.    PMT has been consulted to assist with goals of care conversation.  Subjective: Resting comfortably in bed, awaiting discharge.    Review of Systems  Unable to perform ROS: Mental status change     Physical Exam Constitutional:      General: She is not in acute distress.    Appearance: She is obese.  Cardiovascular:     Rate and Rhythm: Normal rate.  Pulmonary:     Effort: Pulmonary effort is normal.  Skin:    General: Skin is warm and dry.     Coloration: Skin is pale.  Neurological:     Mental Status: She is alert. Mental status is at baseline.     Comments: Minimally verbal, pleasant            Vital Signs: BP (!) 171/71 (BP Location: Right Arm)   Pulse 92   Temp 98.6 F (37 C) (Axillary)   Resp 18   Ht 5' (1.524 m)   Wt 64 kg   SpO2 97%   BMI 27.56 kg/m  SpO2: SpO2: 97 % O2 Device: O2 Device: Room Air O2 Flow Rate: O2 Flow Rate (L/min): 2  L/min  Intake/output summary:  Intake/Output Summary (Last 24 hours) at 02/27/2024 1138 Last data filed at 02/27/2024 1000 Gross per 24 hour  Intake 0 ml  Output 3700 ml  Net -3700 ml   LBM: Last BM Date : 02/26/24 Baseline Weight: Weight: 64.5 kg Most recent weight: Weight: 64 kg       Palliative Assessment/Data: PPS:  20%      Patient Active Problem List   Diagnosis Date Noted   Acute respiratory failure with hypoxia (HCC) 02/23/2024   Bedbound 02/19/2024   Seizure (HCC) 02/18/2024   Femur fracture (HCC) 06/15/2021   History of cardioembolic cerebrovascular accident (CVA) 12/31/2016   Vascular dementia without behavioral disturbance (HCC) 12/31/2016   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   CKD (chronic kidney disease) stage 3, GFR 30-59 ml/min (HCC) 08/26/2016   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 disease, with long-term current use of insulin  (HCC) 10/29/2015   Essential hypertension, benign 10/29/2015   DM type 2 with diabetic peripheral neuropathy (HCC) 02/14/2015   Major depressive disorder, recurrent episode, mild (HCC) 02/14/2015   Umbilical hernia without obstruction and without gangrene 02/14/2015   Primary osteoarthritis of both shoulders 02/14/2015   Slow transit constipation 02/14/2015   Seizure disorder (HCC) 02/14/2015   History of fall 2014-07-25   Death of family member 02/27/14   Rash and nonspecific skin eruption 05/04/2013   OSA (obstructive sleep apnea) 04/06/2013   Abnormality of gait 04/05/2013   Diabetic retinopathy (HCC) 06/22/2012   Hyperlipidemia LDL goal <70 04/02/2012   Cervical cancer screening 05/28/2011   Colon cancer screening 05/28/2011   Allergic rhinitis    Neuropathy due to secondary diabetes (HCC)    Cerebral infarction (HCC)    Bilateral shoulder pain    Backache 07/09/2010   Vitamin D  deficiency 10/31/2009   DEPENDENT EDEMA, LEGS, BILATERAL 10/31/2009   OBESITY, MORBID 05/14/2007    ONYCHOMYCOSIS 04/02/2007   DIABETES MELLITUS, TYPE II 03/31/2007   Major depression, chronic 03/31/2007   Essential hypertension 03/31/2007    Palliative Care Assessment & Plan    Assessment/Recommendations/Plan  Continue current interventions, plan for d/c to SNF with Palla[facility]- high risk for decompensation and rehospitalization- family is aware and will reevaluate goals of care when that occurs. Call placed and discussed with son Blessyn [place], who had reached out to the palliative service and left voicemail for my colleague Micki Alas NP regarding seeking further information about palliative services at [facility]. Explained to him on the phone today about palliative care at [facility] and that [facility] has already been notified. Answered his questions to the best of my ability.    Code Status:   Code Status: Limited: Do not attempt resuscitation (DNR) -DNR-LIMITED -Do Not Intubate/DNI    Prognosis:  < 6 months  Discharge Planning: LTC with Palliative  Care plan was discussed with son on phone.   Thank you for allowing the Palliative Medicine Team to assist in the care of this patient. Low MDM Lujean Sake MD.  Palliative Medicine   Please contact Palliative Medicine Team phone at [phone] for questions and concerns.

## 2024-02-27 NOTE — Plan of Care (Signed)
  Problem: Education: Goal: Ability to describe self-care measures that may prevent or decrease complications (Diabetes Survival Skills Education) will improve Outcome: Not Progressing Goal: Individualized Educational Video(s) Outcome: Not Progressing   Problem: Coping: Goal: Ability to adjust to condition or change in health will improve Outcome: Not Progressing   Problem: Fluid Volume: Goal: Ability to maintain a balanced intake and output will improve Outcome: Not Progressing   Problem: Health Behavior/Discharge Planning: Goal: Ability to identify and utilize available resources and services will improve Outcome: Not Progressing Goal: Ability to manage health-related needs will improve Outcome: Not Progressing   Problem: Metabolic: Goal: Ability to maintain appropriate glucose levels will improve Outcome: Not Progressing   

## 2024-02-27 NOTE — Plan of Care (Signed)
 Problem: Education: Goal: Ability to describe self-care measures that may prevent or decrease complications (Diabetes Survival Skills Education) will improve 02/27/2024 1204 by Kerwin Peels, RN Outcome: Adequate for Discharge 02/27/2024 1204 by Kerwin Peels, RN Outcome: Not Progressing 02/27/2024 0749 by Kerwin Peels, RN Outcome: Not Progressing Goal: Individualized Educational Video(s) 02/27/2024 1204 by Kerwin Peels, RN Outcome: Adequate for Discharge 02/27/2024 1204 by Kerwin Peels, RN Outcome: Not Progressing 02/27/2024 0749 by Kerwin Peels, RN Outcome: Not Progressing   Problem: Coping: Goal: Ability to adjust to condition or change in health will improve 02/27/2024 1204 by Kerwin Peels, RN Outcome: Adequate for Discharge 02/27/2024 1204 by Kerwin Peels, RN Outcome: Not Progressing 02/27/2024 0749 by Kerwin Peels, RN Outcome: Not Progressing   Problem: Fluid Volume: Goal: Ability to maintain a balanced intake and output will improve 02/27/2024 1204 by Kerwin Peels, RN Outcome: Adequate for Discharge 02/27/2024 1204 by Kerwin Peels, RN Outcome: Not Progressing 02/27/2024 0749 by Kerwin Peels, RN Outcome: Not Progressing   Problem: Health Behavior/Discharge Planning: Goal: Ability to identify and utilize available resources and services will improve 02/27/2024 1204 by Chiron Campione, Elsie Halo, RN Outcome: Adequate for Discharge 02/27/2024 1204 by Kerwin Peels, RN Outcome: Not Progressing 02/27/2024 0749 by Kerwin Peels, RN Outcome: Not Progressing Goal: Ability to manage health-related needs will improve 02/27/2024 1204 by Kerwin Peels, RN Outcome: Adequate for Discharge 02/27/2024 1204 by Kerwin Peels, RN Outcome: Not Progressing 02/27/2024 0749 by Kerwin Peels, RN Outcome: Not Progressing   Problem: Metabolic: Goal: Ability to  maintain appropriate glucose levels will improve 02/27/2024 1204 by Kerwin Peels, RN Outcome: Adequate for Discharge 02/27/2024 1204 by Kerwin Peels, RN Outcome: Not Progressing 02/27/2024 0749 by Kerwin Peels, RN Outcome: Not Progressing   Problem: Nutritional: Goal: Maintenance of adequate nutrition will improve 02/27/2024 1204 by Kerwin Peels, RN Outcome: Adequate for Discharge 02/27/2024 1204 by Kerwin Peels, RN Outcome: Not Progressing Goal: Progress toward achieving an optimal weight will improve 02/27/2024 1204 by Kerwin Peels, RN Outcome: Adequate for Discharge 02/27/2024 1204 by Kerwin Peels, RN Outcome: Not Progressing   Problem: Skin Integrity: Goal: Risk for impaired skin integrity will decrease 02/27/2024 1204 by Kerwin Peels, RN Outcome: Adequate for Discharge 02/27/2024 1204 by Kerwin Peels, RN Outcome: Not Progressing   Problem: Tissue Perfusion: Goal: Adequacy of tissue perfusion will improve 02/27/2024 1204 by Kerwin Peels, RN Outcome: Adequate for Discharge 02/27/2024 1204 by Kerwin Peels, RN Outcome: Not Progressing   Problem: Education: Goal: Knowledge of General Education information will improve Description: Including pain rating scale, medication(s)/side effects and non-pharmacologic comfort measures 02/27/2024 1204 by Kerwin Peels, RN Outcome: Adequate for Discharge 02/27/2024 1204 by Kerwin Peels, RN Outcome: Not Progressing   Problem: Health Behavior/Discharge Planning: Goal: Ability to manage health-related needs will improve 02/27/2024 1204 by Kerwin Peels, RN Outcome: Adequate for Discharge 02/27/2024 1204 by Kerwin Peels, RN Outcome: Not Progressing   Problem: Clinical Measurements: Goal: Ability to maintain clinical measurements within normal limits will improve 02/27/2024 1204 by Kerwin Peels, RN Outcome:  Adequate for Discharge 02/27/2024 1204 by Kerwin Peels, RN Outcome: Not Progressing Goal: Will remain free from infection 02/27/2024 1204 by Kerwin Peels, RN Outcome: Adequate for Discharge 02/27/2024 1204 by Kerwin Peels, RN Outcome: Not Progressing Goal: Diagnostic test results will improve 02/27/2024 1204 by Kerwin Peels, RN Outcome:  Adequate for Discharge 02/27/2024 1204 by Kerwin Peels, RN Outcome: Not Progressing Goal: Respiratory complications will improve 02/27/2024 1204 by Kerwin Peels, RN Outcome: Adequate for Discharge 02/27/2024 1204 by Kerwin Peels, RN Outcome: Not Progressing Goal: Cardiovascular complication will be avoided 02/27/2024 1204 by Kerwin Peels, RN Outcome: Adequate for Discharge 02/27/2024 1204 by Kerwin Peels, RN Outcome: Not Progressing   Problem: Activity: Goal: Risk for activity intolerance will decrease 02/27/2024 1204 by Kerwin Peels, RN Outcome: Adequate for Discharge 02/27/2024 1204 by Kerwin Peels, RN Outcome: Not Progressing   Problem: Nutrition: Goal: Adequate nutrition will be maintained 02/27/2024 1204 by Kerwin Peels, RN Outcome: Adequate for Discharge 02/27/2024 1204 by Kerwin Peels, RN Outcome: Not Progressing   Problem: Coping: Goal: Level of anxiety will decrease 02/27/2024 1204 by Kerwin Peels, RN Outcome: Adequate for Discharge 02/27/2024 1204 by Kerwin Peels, RN Outcome: Not Progressing   Problem: Elimination: Goal: Will not experience complications related to bowel motility 02/27/2024 1204 by Kerwin Peels, RN Outcome: Adequate for Discharge 02/27/2024 1204 by Kerwin Peels, RN Outcome: Not Progressing Goal: Will not experience complications related to urinary retention 02/27/2024 1204 by Kerwin Peels, RN Outcome: Adequate for Discharge 02/27/2024 1204 by Kerwin Peels, RN Outcome: Not Progressing   Problem: Pain Managment: Goal: General experience of comfort will improve and/or be controlled 02/27/2024 1204 by Kerwin Peels, RN Outcome: Adequate for Discharge 02/27/2024 1204 by Kerwin Peels, RN Outcome: Not Progressing   Problem: Safety: Goal: Ability to remain free from injury will improve 02/27/2024 1204 by Kerwin Peels, RN Outcome: Adequate for Discharge 02/27/2024 1204 by Kerwin Peels, RN Outcome: Not Progressing   Problem: Skin Integrity: Goal: Risk for impaired skin integrity will decrease 02/27/2024 1204 by Kerwin Peels, RN Outcome: Adequate for Discharge 02/27/2024 1204 by Kerwin Peels, RN Outcome: Not Progressing

## 2024-02-27 NOTE — TOC Transition Note (Signed)
 Transition of Care [facility]) - Discharge Note  Patient Details  Name: Yvette Gutierrez MRN: [id number] Date of Birth: 1945/12/17  Transition of [facility]) CM/SW Contact:  Zenon Hilda, LCSW Phone Number: 02/27/2024, 12:55 PM  Clinical Narrative: Patient is medically ready to return to [facility]. FL2 done. Patient will go to room 101 and the number for report is [phone]. Discharge summary, discharge orders, FL2, and SNF transfer report faxed to facility in hub. Medical necessity form done; PTAR scheduled. Discharge packet completed. CSW notified son, Yvette Gutierrez, regarding discharge and transportation being set up. RN updated. TOC signing off.  Final next level of care: Long Term Nursing Home Barriers to Discharge: Barriers Resolved  Patient Goals and CMS Choice Patient states their goals for this hospitalization and ongoing recovery are:: LTC [address] Choice offered to / list presented to : NA  Discharge Placement   Patient [facility] bed at: [facility] Patient to be transferred to facility by: PTAR Name of family member notified: Yvette Gutierrez (son) Patient and family notified of of transfer: 02/27/24  Discharge Plan and Services Additional resources added to the After Visit Summary for   In-house Referral: Clinical Social Work Post Acute Care Choice: Nursing Home          DME Arranged: N/A DME Agency: NA  Social Drivers of Health (SDOH) Interventions SDOH Screenings   Food Insecurity: Patient Unable To Answer (02/19/2024)  Housing: Patient Unable To Answer (02/19/2024)  Transportation Needs: Patient Unable To Answer (02/19/2024)  Utilities: Patient Unable To Answer (02/19/2024)  Social Connections: Patient Unable To Answer (02/19/2024)  Tobacco Use: Low Risk  (02/19/2024)   Readmission Risk Interventions     No data to display

## 2024-02-27 NOTE — [place] FL2 (Signed)
 [place]  MEDICAID FL2 LEVEL OF CARE FORM     IDENTIFICATION  Patient Name: Yvette Gutierrez Birthdate: May 19, 1946 Sex: female Admission Date (Current Location): 02/18/2024  [place] and [place] Number:  Ernesto Heady [id number] S Facility and Address:  [facility],  [address], [place] [address]      Provider Number: [id number]  Attending Physician Name and Address:  Ephriam Hashimoto, *  Relative Name and Phone Number:  Ronin Crager (son) Ph: [phone]    Current Level of Care: Hospital Recommended Level of Care: Nursing Facility Prior Approval Number:    Date Approved/Denied:   PASRR Number: [id number]  Discharge Plan: SNF [facility])    Current Diagnoses: Patient Active Problem List   Diagnosis Date Noted   Acute respiratory failure with hypoxia (HCC) 02/23/2024   Bedbound 02/19/2024   Seizure (HCC) 02/18/2024   Femur fracture (HCC) 06/15/2021   History of cardioembolic cerebrovascular accident (CVA) 12/31/2016   Vascular dementia without behavioral disturbance (HCC) 12/31/2016   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   CKD (chronic kidney disease) stage 3, GFR 30-59 ml/min (HCC) 08/26/2016   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 disease, with long-term current use of insulin  (HCC) 10/29/2015   Essential hypertension, benign 10/29/2015   DM type 2 with diabetic peripheral neuropathy (HCC) 02/14/2015   Major depressive disorder, recurrent episode, mild (HCC) 02/14/2015   Umbilical hernia without obstruction and without gangrene 02/14/2015   Primary osteoarthritis of both shoulders 02/14/2015   Slow transit constipation 02/14/2015   Seizure disorder (HCC) 02/14/2015   History of fall 2014-07-10   Death of family member Feb 12, 2014   Rash and nonspecific skin eruption 05/04/2013   OSA (obstructive sleep apnea) 04/06/2013   Abnormality of gait 04/05/2013   Diabetic retinopathy (HCC) 06/22/2012    Hyperlipidemia LDL goal <70 04/02/2012   Cervical cancer screening 05/28/2011   Colon cancer screening 05/28/2011   Allergic rhinitis    Neuropathy due to secondary diabetes (HCC)    Cerebral infarction (HCC)    Bilateral shoulder pain    Backache 07/09/2010   Vitamin D  deficiency 10/31/2009   DEPENDENT EDEMA, LEGS, BILATERAL 10/31/2009   OBESITY, MORBID 05/14/2007   ONYCHOMYCOSIS 04/02/2007   DIABETES MELLITUS, TYPE II 03/31/2007   Major depression, chronic 03/31/2007   Essential hypertension 03/31/2007    Orientation RESPIRATION BLADDER Height & Weight     Self  Normal Incontinent Weight: 141 lb 1.5 oz (64 kg) Height:  5' (152.4 cm)  BEHAVIORAL SYMPTOMS/MOOD NEUROLOGICAL BOWEL NUTRITION STATUS      Incontinent Diet (Dysphagia 2 diet)  AMBULATORY STATUS COMMUNICATION OF NEEDS Skin   Total Care Verbally Other (Comment), Skin abrasions (Abrasion: left arm; Erythema: back, buttocks, left heel)                       Personal Care Assistance Level of Assistance  Bathing, Feeding, Dressing Bathing Assistance: Maximum assistance Feeding assistance: Independent Dressing Assistance: Maximum assistance     Functional Limitations Info  Sight, Hearing, Speech Sight Info: Adequate Hearing Info: Adequate Speech Info: Adequate    SPECIAL CARE FACTORS FREQUENCY                       Contractures Contractures Info: Not present    Additional Factors Info  Code Status, Allergies, Insulin  Sliding Scale Code Status Info: DNR Allergies Info: NKA   Insulin  Sliding Scale Info: See discharge summary  Current Medications (02/27/2024):  This is the current hospital active medication list Current Facility-Administered Medications  Medication Dose Route Frequency Provider Last Rate Last Admin   acetaminophen  (TYLENOL ) tablet 650 mg  650 mg Oral Q6H PRN Dahal, Aminta Baldy, MD   650 mg at 02/19/24 1743   Or   acetaminophen  (TYLENOL ) suppository 650 mg  650 mg Rectal Q6H PRN  Dahal, Aminta Baldy, MD   650 mg at 02/20/24 1120   albuterol  (PROVENTIL ) (2.5 MG/3ML) 0.083% nebulizer solution 2.5 mg  2.5 mg Nebulization Q6H PRN Dahal, Aminta Baldy, MD       Chlorhexidine  Gluconate Cloth 2 % PADS 6 each  6 each Topical Daily Rizwan, Saima, MD   6 each at 02/27/24 0844   dipyridamole -aspirin  (AGGRENOX ) 200-25 MG per 12 hr capsule 1 capsule  1 capsule Oral BID Ephriam Hashimoto, MD   1 capsule at 02/26/24 1838   feeding supplement (ENSURE ENLIVE / ENSURE PLUS) liquid 237 mL  237 mL Oral BID BM Danford, Willis Harter, MD   237 mL at 02/27/24 0844   ferrous sulfate  tablet 325 mg  325 mg Oral Q breakfast Ephriam Hashimoto, MD   325 mg at 02/26/24 1102   furosemide  (LASIX ) tablet 20 mg  20 mg Oral Daily Danford, Willis Harter, MD       insulin  aspart (novoLOG ) injection 0-15 Units  0-15 Units Subcutaneous TID WC Danford, Christopher P, MD   11 Units at 02/27/24 0844   insulin  aspart (novoLOG ) injection 0-5 Units  0-5 Units Subcutaneous QHS Danford, Willis Harter, MD       insulin  glargine-yfgn (SEMGLEE ) injection 15 Units  15 Units Subcutaneous BID Ephriam Hashimoto, MD   15 Units at 02/27/24 1012   lacosamide  (VIMPAT ) tablet 50 mg  50 mg Oral BID Ephriam Hashimoto, MD   50 mg at 02/26/24 1836   linagliptin  (TRADJENTA ) tablet 5 mg  5 mg Oral Daily Danford, Willis Harter, MD   5 mg at 02/26/24 1836   lisinopril  (ZESTRIL ) tablet 5 mg  5 mg Oral Daily Danford, Willis Harter, MD   5 mg at 02/26/24 1104   memantine  (NAMENDA ) tablet 10 mg  10 mg Oral BID Ephriam Hashimoto, MD   10 mg at 02/26/24 1836   metFORMIN  (GLUCOPHAGE ) tablet 500 mg  500 mg Oral BID WC Danford, Willis Harter, MD   500 mg at 02/26/24 1837   NIFEdipine  (PROCARDIA -XL/NIFEDICAL-XL) 24 hr tablet 30 mg  30 mg Oral Daily Danford, Willis Harter, MD   30 mg at 02/25/24 1742   ondansetron  (ZOFRAN ) injection 4 mg  4 mg Intravenous Q6H PRN Rizwan, Saima, MD   4 mg at 02/22/24 4098   Oral care mouth rinse  15 mL  Mouth Rinse PRN Rizwan, Saima, MD       Oral care mouth rinse  15 mL Mouth Rinse 4 times per day Rizwan, Saima, MD   15 mL at 02/27/24 0845   oxyCODONE  (Oxy IR/ROXICODONE ) immediate release tablet 5 mg  5 mg Oral Q6H PRN Mahan, Kasie J, NP       polyethylene glycol (MIRALAX  / GLYCOLAX ) packet 17 g  17 g Oral Daily PRN Dahal, Aminta Baldy, MD       potassium chloride  SA (KLOR-CON  M) CR tablet 20 mEq  20 mEq Oral Daily Danford, Willis Harter, MD       simvastatin  (ZOCOR ) tablet 10 mg  10 mg Oral QHS Ephriam Hashimoto, MD   10 mg at 02/26/24 1837  sodium chloride  flush (NS) 0.9 % injection 10-40 mL  10-40 mL Intracatheter Q12H Danford, Willis Harter, MD   10 mL at 02/27/24 0845   sodium chloride  flush (NS) 0.9 % injection 10-40 mL  10-40 mL Intracatheter PRN Danford, Willis Harter, MD         Discharge Medications: Please see discharge summary for a list of discharge medications.  Relevant Imaging Results:  Relevant Lab Results:   Additional Information SSN: [id number]  Zenon Hilda, LCSW
